# Patient Record
Sex: Female | Born: 1970
Health system: Southern US, Community
[De-identification: ages and names within clinical notes are randomized; demographics above are authoritative.]

## PROBLEM LIST (undated history)

## (undated) DIAGNOSIS — R0600 Dyspnea, unspecified: Secondary | ICD-10-CM

## (undated) DIAGNOSIS — E785 Hyperlipidemia, unspecified: Secondary | ICD-10-CM

## (undated) DIAGNOSIS — M1990 Unspecified osteoarthritis, unspecified site: Secondary | ICD-10-CM

## (undated) DIAGNOSIS — G4730 Sleep apnea, unspecified: Secondary | ICD-10-CM

## (undated) DIAGNOSIS — K219 Gastro-esophageal reflux disease without esophagitis: Secondary | ICD-10-CM

## (undated) DIAGNOSIS — E669 Obesity, unspecified: Secondary | ICD-10-CM

## (undated) DIAGNOSIS — D649 Anemia, unspecified: Secondary | ICD-10-CM

## (undated) DIAGNOSIS — R5383 Other fatigue: Secondary | ICD-10-CM

## (undated) DIAGNOSIS — G479 Sleep disorder, unspecified: Secondary | ICD-10-CM

## (undated) DIAGNOSIS — T7840XA Allergy, unspecified, initial encounter: Secondary | ICD-10-CM

## (undated) DIAGNOSIS — N6001 Solitary cyst of right breast: Secondary | ICD-10-CM

## (undated) DIAGNOSIS — R7303 Prediabetes: Secondary | ICD-10-CM

## (undated) DIAGNOSIS — Z720 Tobacco use: Secondary | ICD-10-CM

## (undated) DIAGNOSIS — M2550 Pain in unspecified joint: Secondary | ICD-10-CM

## (undated) DIAGNOSIS — K5900 Constipation, unspecified: Secondary | ICD-10-CM

## (undated) DIAGNOSIS — M549 Dorsalgia, unspecified: Secondary | ICD-10-CM

## (undated) DIAGNOSIS — R600 Localized edema: Secondary | ICD-10-CM

## (undated) DIAGNOSIS — I10 Essential (primary) hypertension: Secondary | ICD-10-CM

## (undated) HISTORY — DX: Dyspnea, unspecified: R06.00

## (undated) HISTORY — DX: Allergy, unspecified, initial encounter: T78.40XA

## (undated) HISTORY — DX: Dorsalgia, unspecified: M54.9

## (undated) HISTORY — DX: Pain in unspecified joint: M25.50

## (undated) HISTORY — PX: HAND FUSION: SHX975

## (undated) HISTORY — DX: Sleep disorder, unspecified: G47.9

## (undated) HISTORY — DX: Gastro-esophageal reflux disease without esophagitis: K21.9

## (undated) HISTORY — PX: ABDOMINAL HYSTERECTOMY: SHX81

## (undated) HISTORY — DX: Tobacco use: Z72.0

## (undated) HISTORY — DX: Localized edema: R60.0

## (undated) HISTORY — DX: Obesity, unspecified: E66.9

## (undated) HISTORY — DX: Other fatigue: R53.83

## (undated) HISTORY — DX: Hyperlipidemia, unspecified: E78.5

## (undated) HISTORY — DX: Constipation, unspecified: K59.00

---

## 2003-04-18 LAB — PULMONARY FUNCTION TEST

## 2011-05-10 HISTORY — PX: SLEEVE GASTROPLASTY: SHX1101

## 2013-03-26 ENCOUNTER — Other Ambulatory Visit: Payer: Self-pay | Admitting: Internal Medicine

## 2013-03-26 DIAGNOSIS — N920 Excessive and frequent menstruation with regular cycle: Secondary | ICD-10-CM

## 2013-04-22 ENCOUNTER — Ambulatory Visit
Admission: RE | Admit: 2013-04-22 | Discharge: 2013-04-22 | Disposition: A | Discharge status: Home / Self Care | Payer: BC Managed Care – PPO | Source: Ambulatory Visit | Attending: Internal Medicine | Admitting: Internal Medicine

## 2013-04-22 DIAGNOSIS — N920 Excessive and frequent menstruation with regular cycle: Secondary | ICD-10-CM

## 2013-06-04 ENCOUNTER — Other Ambulatory Visit: Payer: Self-pay

## 2013-06-04 DIAGNOSIS — Z1231 Encounter for screening mammogram for malignant neoplasm of breast: Secondary | ICD-10-CM

## 2013-06-06 ENCOUNTER — Ambulatory Visit: Payer: Self-pay | Admitting: Obstetrics & Gynecology

## 2013-06-10 ENCOUNTER — Encounter: Payer: Self-pay | Admitting: Obstetrics & Gynecology

## 2013-06-26 ENCOUNTER — Ambulatory Visit
Admission: RE | Admit: 2013-06-26 | Discharge: 2013-06-26 | Disposition: A | Discharge status: Home / Self Care | Payer: BC Managed Care – PPO | Source: Ambulatory Visit

## 2013-06-26 DIAGNOSIS — Z1231 Encounter for screening mammogram for malignant neoplasm of breast: Secondary | ICD-10-CM

## 2013-07-01 ENCOUNTER — Ambulatory Visit (INDEPENDENT_AMBULATORY_CARE_PROVIDER_SITE_OTHER): Payer: BC Managed Care – PPO | Admitting: Obstetrics & Gynecology

## 2013-07-01 ENCOUNTER — Encounter: Payer: Self-pay | Admitting: Obstetrics & Gynecology

## 2013-07-01 VITALS — BP 130/85 | HR 101 | Temp 98.5°F | Ht 67.0 in | Wt 198.0 lb

## 2013-07-01 DIAGNOSIS — N939 Abnormal uterine and vaginal bleeding, unspecified: Secondary | ICD-10-CM

## 2013-07-01 DIAGNOSIS — N390 Urinary tract infection, site not specified: Secondary | ICD-10-CM

## 2013-07-01 LAB — POCT URINALYSIS DIPSTICK
Bilirubin, UA: NEGATIVE
Blood, UA: NEGATIVE
Glucose, UA: NEGATIVE
Ketones, UA: NEGATIVE
NITRITE UA: POSITIVE
PH UA: 5
Spec Grav, UA: 1.025
UROBILINOGEN UA: NEGATIVE

## 2013-07-01 NOTE — Progress Notes (Unsigned)
Subjective:     Kayla [name] is a 43 y.o. female here for a routine exam.  Current complaints: Pt was referred by [facility].  Pt state that her menstral cycle are irregular with heavy bleeding.  Pt states that cycle are usually around 6 days. Pt states that she also has night sweats and hot flashes daily.  Pt states that this has been occurring for the past few months to a year.  Pt had an u/s at the end of 2014 with fibroids shown.   Personal health questionnaire reviewed: yes.   Gynecologic History Patient's last menstrual period was 06/12/2013. Contraception: none Last Pap: 1-2. Results were: normal Last mammogram: 2015. Results were: results not back  Obstetric History OB History  No data available     The following portions of the patient's history were reviewed and updated as appropriate: allergies, current medications, past family history, past medical history, past social history, past surgical history and problem list.  Review of Systems {ros; complete:30496}    Objective:    {exam; complete:18323}    Assessment:    Healthy female exam.    Plan:    {plan:19193}

## 2013-07-03 LAB — URINE CULTURE: Colony Count: >=100000

## 2013-07-05 ENCOUNTER — Encounter: Payer: Self-pay | Admitting: Obstetrics & Gynecology

## 2013-07-05 ENCOUNTER — Ambulatory Visit (INDEPENDENT_AMBULATORY_CARE_PROVIDER_SITE_OTHER): Payer: BC Managed Care – PPO | Admitting: Obstetrics & Gynecology

## 2013-07-05 VITALS — BP 133/93 | HR 90 | Temp 97.8°F | Ht 67.0 in | Wt 199.0 lb

## 2013-07-05 DIAGNOSIS — N926 Irregular menstruation, unspecified: Secondary | ICD-10-CM

## 2013-07-05 DIAGNOSIS — N939 Abnormal uterine and vaginal bleeding, unspecified: Secondary | ICD-10-CM

## 2013-07-05 LAB — CBC
HEMATOCRIT: 33.5 % — AB (ref 36.0–46.0)
Hemoglobin: 10.3 g/dL — ABNORMAL LOW (ref 12.0–15.0)
MCH: 25.6 pg — AB (ref 26.0–34.0)
MCHC: 30.7 g/dL (ref 30.0–36.0)
MCV: 83.1 fL (ref 78.0–100.0)
Platelets: 395 10*3/uL (ref 150–400)
RBC: 4.03 MIL/uL (ref 3.87–5.11)
RDW: 16.4 % — ABNORMAL HIGH (ref 11.5–15.5)
WBC: 11.4 10*3/uL — ABNORMAL HIGH (ref 4.0–10.5)

## 2013-07-05 LAB — POCT URINE PREGNANCY: Preg Test, Ur: NEGATIVE

## 2013-07-05 MED ORDER — NORETHINDRONE 0.35 MG PO TABS: 1.0000 | ORAL_TABLET | Freq: Every day | ORAL | Status: DC

## 2013-07-05 MED ORDER — SULFAMETHOXAZOLE-TMP DS 800-160 MG PO TABS: 1.0000 | ORAL_TABLET | Freq: Two times a day (BID) | ORAL | Status: DC

## 2013-07-05 NOTE — Progress Notes (Signed)
Subjective:     Kayla [name] is a 43 y.o. female here for a routine exam.  Current complaints: Patient is in the office today for a problem visit for her fibriods. Patient states she is having extreme heavy bleeding. Patient states she is passing blood clots. Patient states she is having extreme crampyiness. Patient states her cycles having been lasting for 7 days. Patient states that is not normal for her that the 4th day is normally the last day of her cycle. Patient states she has to wear two pads at at time and has to change quiet frequently. Patient states she can go through a 48 pack in 3 days but that is using 2 pads at a time.   Personal health questionnaire reviewed: yes.   Gynecologic History Patient's last menstrual period was 07/05/2013. Contraception: none Last Pap: 2013. Results were: normal Last mammogram: 2015. Results were: normal  Obstetric History OB History  Gravida Para Term Preterm AB SAB TAB Ectopic Multiple Living  4 4 4       3     # Outcome Date GA Lbr Len/2nd Weight Sex Delivery Anes PTL Lv  4 TRM 04/20/05 [redacted]w[redacted]d  8 lb (3.629 kg) M LTCS EPI  Y  3 TRM 01/26/03   5 lb 11 oz (2.58 kg) M SVD None  Y  2 TRM 06/23/91 [redacted]w[redacted]d  7 lb 11 oz (3.487 kg) F SVD None  Y  1 TRM 06/23/87 [redacted]w[redacted]d  7 lb 11 oz (3.487 kg) M SVD None  N       The following portions of the patient's history were reviewed and updated as appropriate: allergies, current medications, past family history, past medical history, past social history, past surgical history and problem list.  Review of Systems Pertinent items are noted in HPI.  Objective:   No exam performed today, patient refused exam due to heavy vaginal bleeding  Assessment:   AUB--A,L   Plan:   Hysterectomy- candidate for a laparoscopic approach UPT LABS-TSH , CBC,PROLACTIN Return for Pap smear, endometrial biopsy

## 2013-07-06 LAB — TSH: TSH: 1.621 u[IU]/mL (ref 0.350–4.500)

## 2013-07-06 LAB — PROLACTIN: Prolactin: 13 ng/mL

## 2013-07-10 NOTE — Patient Instructions (Signed)
Hysterectomy Information  A hysterectomy is a surgery in which your uterus is removed. This surgery may be done to treat various medical problems. After the surgery, you will no longer have menstrual periods. The surgery will also make you unable to become pregnant (sterile). The fallopian tubes and ovaries can be removed (bilateral salpingo-oophorectomy) during this surgery as well.  REASONS FOR A HYSTERECTOMY  Persistent, abnormal bleeding.  Lasting (chronic) pelvic pain or infection.  The lining of the uterus (endometrium) starts growing outside the uterus (endometriosis).  The endometrium starts growing in the muscle of the uterus (adenomyosis).  The uterus falls down into the vagina (pelvic organ prolapse).  Noncancerous growths in the uterus (uterine fibroids) that cause symptoms.  Precancerous cells.  Cervical cancer or uterine cancer. TYPES OF HYSTERECTOMIES  Supracervical hysterectomy In this type, the top part of the uterus is removed, but not the cervix.  Total hysterectomy The uterus and cervix are removed.  Radical hysterectomy The uterus, the cervix, and the fibrous tissue that holds the uterus in place in the pelvis (parametrium) are removed. WAYS A HYSTERECTOMY CAN BE PERFORMED  Abdominal hysterectomy A large surgical cut (incision) is made in the abdomen. The uterus is removed through this incision.  Vaginal hysterectomy An incision is made in the vagina. The uterus is removed through this incision. There are no abdominal incisions.  Conventional laparoscopic hysterectomy Three or four small incisions are made in the abdomen. A thin, lighted tube with a camera (laparoscope) is inserted into one of the incisions. Other tools are put through the other incisions. The uterus is cut into small pieces. The small pieces are removed through the incisions, or they are removed through the vagina.  Laparoscopically assisted vaginal hysterectomy (LAVH) Three or four small  incisions are made in the abdomen. Part of the surgery is performed laparoscopically and part vaginally. The uterus is removed through the vagina.  Robot-assisted laparoscopic hysterectomy A laparoscope and other tools are inserted into 3 or 4 small incisions in the abdomen. A computer-controlled device is used to give the surgeon a 3D image and to help control the surgical instruments. This allows for more precise movements of surgical instruments. The uterus is cut into small pieces and removed through the incisions or removed through the vagina. RISKS AND COMPLICATIONS  Possible complications associated with this procedure include:  Bleeding and risk of blood transfusion. Tell your health care provider if you do not want to receive any blood products.  Blood clots in the legs or lung.  Infection.  Injury to surrounding organs.  Problems or side effects related to anesthesia.  Conversion to an abdominal hysterectomy from one of the other techniques. WHAT TO EXPECT AFTER A HYSTERECTOMY  You will be given pain medicine.  You will need to have someone with you for the first 3 5 days after you go home.  You will need to follow up with your surgeon in 2 4 weeks after surgery to evaluate your progress.  You may have early menopause symptoms such as hot flashes, night sweats, and insomnia.  If you had a hysterectomy for a problem that was not cancer or not a condition that could lead to cancer, then you no longer need Pap tests. However, even if you no longer need a Pap test, a regular exam is a good idea to make sure no other problems are starting. Document Released: 10/19/2000 Document Revised: 02/13/2013 Document Reviewed: 12/31/2012 [facility] Patient Information 2014 [place].

## 2013-08-05 ENCOUNTER — Ambulatory Visit: Payer: BC Managed Care – PPO | Admitting: Obstetrics & Gynecology

## 2013-08-14 ENCOUNTER — Telehealth: Payer: Self-pay | Admitting: *Deleted

## 2013-08-14 NOTE — Telephone Encounter (Signed)
Error

## 2013-08-22 ENCOUNTER — Other Ambulatory Visit: Payer: Self-pay

## 2013-08-22 ENCOUNTER — Ambulatory Visit (INDEPENDENT_AMBULATORY_CARE_PROVIDER_SITE_OTHER): Payer: BC Managed Care – PPO | Admitting: Obstetrics & Gynecology

## 2013-08-22 VITALS — BP 128/85 | HR 90 | Temp 98.3°F | Wt 199.0 lb

## 2013-08-22 DIAGNOSIS — N938 Other specified abnormal uterine and vaginal bleeding: Secondary | ICD-10-CM

## 2013-08-22 DIAGNOSIS — Z124 Encounter for screening for malignant neoplasm of cervix: Secondary | ICD-10-CM

## 2013-08-22 DIAGNOSIS — Z01818 Encounter for other preprocedural examination: Secondary | ICD-10-CM

## 2013-08-22 DIAGNOSIS — N925 Other specified irregular menstruation: Secondary | ICD-10-CM

## 2013-08-22 DIAGNOSIS — N949 Unspecified condition associated with female genital organs and menstrual cycle: Secondary | ICD-10-CM

## 2013-08-22 LAB — POCT URINE PREGNANCY: Preg Test, Ur: NEGATIVE

## 2013-08-22 NOTE — Progress Notes (Signed)
Endometrial Biopsy Procedure Note  Pre-operative Diagnosis: AUB--L  Post-operative Diagnosis: same  Indications: abnormal uterine bleeding  Procedure Details   Urine pregnancy test was done and result was negative.  The risks (including infection, bleeding, pain, and uterine perforation) and benefits of the procedure were explained to the patient and Written informed consent was obtained.   The patient was placed in the dorsal lithotomy position.  Bimanual exam showed the uterus to be in the anteroflexed position.  A Graves' speculum inserted in the vagina, and the cervix prepped with povidone iodine.  Endocervical curettage with a Kevorkian curette was not performed.   A sharp tenaculum was applied to the anterior lip of the cervix for stabilization.  A sterile uterine sound was used to sound the uterus to a depth of 9cm.  A Pipelle endometrial aspirator was used to sample the endometrium.  Sample was sent for pathologic examination.  Condition: Stable  Complications: None  Plan:  The patient was advised to call for any fever or for prolonged or severe pain or bleeding. She was advised to use OTC analgesics as needed for mild to moderate pain. She was advised to avoid vaginal intercourse for 48 hours or until the bleeding has completely stopped.

## 2013-08-23 LAB — PAP IG AND HPV HIGH-RISK: HPV DNA High Risk: NOT DETECTED

## 2013-08-25 ENCOUNTER — Encounter: Payer: Self-pay | Admitting: Obstetrics & Gynecology

## 2013-08-25 NOTE — Patient Instructions (Signed)
Endometrial Biopsy Endometrial biopsy is a procedure in which a tissue sample is taken from inside the uterus. The tissue sample is then looked at under a microscope to see if the tissue is normal or abnormal. The endometrium is the lining of the uterus. This procedure helps determine where you are in your menstrual cycle and how hormone levels are affecting the lining of the uterus. This procedure may also be used to evaluate uterine bleeding or to diagnose endometrial cancer, tuberculosis, polyps, or inflammatory conditions.  LET YOUR HEALTH CARE PROVIDER KNOW ABOUT:  Any allergies you have.  All medicines you are taking, including vitamins, herbs, eye drops, creams, and over-the-counter medicines.  Previous problems you or members of your family have had with the use of anesthetics.  Any blood disorders you have.  Previous surgeries you have had.  Medical conditions you have.  Possibility of pregnancy. RISKS AND COMPLICATIONS Generally, this is a safe procedure. However, as with any procedure, complications can occur. Possible complications include:  Bleeding.  Pelvic infection.  Puncture of the uterine wall with the biopsy device (rare). BEFORE THE PROCEDURE   Keep a record of your menstrual cycles as directed by your health care provider. You may need to schedule your procedure for a specific time in your cycle.  You may want to bring a sanitary pad to wear home after the procedure.  Arrange for someone to drive you home after the procedure if you will be given a medicine to help you relax (sedative). PROCEDURE   You may be given a sedative to relax you.  You will lie on an exam table with your feet and legs supported as in a pelvic exam.  Your health care provider will insert an instrument (speculum) into your vagina to see your cervix.  Your cervix will be cleansed with an antiseptic solution. A medicine (local anesthetic) will be used to numb the cervix.  A forceps  instrument (tenaculum) will be used to hold your cervix steady for the biopsy.  A thin, rodlike instrument (uterine sound) will be inserted through your cervix to determine the length of your uterus and the location where the biopsy sample will be removed.  A thin, flexible tube (catheter) will be inserted through your cervix and into the uterus. The catheter is used to collect the biopsy sample from your endometrial tissue.  The catheter and speculum will then be removed, and the tissue sample will be sent to a lab for examination. AFTER THE PROCEDURE  You will rest in a recovery area until you are ready to go home.  You may have mild cramping and a small amount of vaginal bleeding for a few days after the procedure. This is normal.  Make sure you find out how to get your test results. Document Released: 08/26/2004 Document Revised: 12/26/2012 Document Reviewed: 10/10/2012 [employer] Patient Information 2014 [employer].  

## 2013-08-26 ENCOUNTER — Other Ambulatory Visit: Payer: Self-pay | Admitting: *Deleted

## 2013-08-26 ENCOUNTER — Ambulatory Visit: Payer: BC Managed Care – PPO | Admitting: *Deleted

## 2013-08-26 ENCOUNTER — Encounter: Payer: Self-pay | Admitting: Obstetrics & Gynecology

## 2013-08-26 ENCOUNTER — Ambulatory Visit (INDEPENDENT_AMBULATORY_CARE_PROVIDER_SITE_OTHER): Payer: BC Managed Care – PPO | Admitting: *Deleted

## 2013-08-26 VITALS — BP 124/82 | HR 98 | Temp 98.1°F | Ht 67.5 in | Wt 204.0 lb

## 2013-08-26 VITALS — BP 124/82 | HR 98 | Temp 98.1°F | Wt 204.0 lb

## 2013-08-26 DIAGNOSIS — N898 Other specified noninflammatory disorders of vagina: Secondary | ICD-10-CM

## 2013-08-26 MED ORDER — FLUCONAZOLE 150 MG PO TABS: 150.0000 mg | ORAL_TABLET | ORAL | Status: DC

## 2013-08-26 NOTE — Progress Notes (Unsigned)
Patient is in the office today for a Problem Visit. Patient originally planned to see doctor Delsa Sale but could not wait because of work. Patient states last Thursday she had a biopsy done in the office and ever since then has been having irritation, itching, burning, and a watery vaginal discharge. Patient denies any vaginal odor. Patient obtained her on Affirm Specimen. Specimen to be sent to the lab. Per Nursing Protocol Diflucan sent to the Pharmacy.

## 2013-08-26 NOTE — Progress Notes (Unsigned)
Patient states last Thursday she had a biopsy and even since then she has been having itching, irritation, burning and a watery vaginal discharge. Patient denies any vaginal odor. Patient was prepared to see the doctor but could not wait due to work. Patient obtained her own affirm specimen. Affirm sent to Lab. Per Nursing Protocol Diflucan sent to pharmacy.

## 2013-08-27 LAB — WET PREP BY MOLECULAR PROBE
Candida species: POSITIVE — AB
GARDNERELLA VAGINALIS: NEGATIVE
Trichomonas vaginosis: NEGATIVE

## 2013-08-28 ENCOUNTER — Encounter: Payer: Self-pay | Admitting: Obstetrics & Gynecology

## 2013-08-30 ENCOUNTER — Encounter: Payer: Self-pay | Admitting: Obstetrics & Gynecology

## 2013-09-09 ENCOUNTER — Encounter: Payer: Self-pay | Admitting: *Deleted

## 2013-09-13 ENCOUNTER — Encounter (HOSPITAL_COMMUNITY): Payer: Self-pay | Admitting: Pharmacist

## 2013-09-24 NOTE — H&P (Signed)
  Subjective:  Kayla [name] is a 43 y.o.  female.  I was consulted regarding irregular bleeding.  Onset of symptoms was gradual starting 1 year ago with unchanged course since that time. Bleeding is characterized as heavy.  Pertinent Gyn History:  Menses see above Bleeding: see above Blood transfusions: none  STDs: no past history Preventive screening:   Last pap: normal Date: 4/15   No past medical history on file.  Past Surgical History  Procedure Laterality Date  . Cesarean section    . Sleeve gastroplasty      No prescriptions prior to admission   Allergies  Allergen Reactions  . Morphine And Related Nausea And Vomiting    History  Substance Use Topics  . Smoking status: Current Every Day Smoker  . Smokeless tobacco: Never Used  . Alcohol Use: Yes     Comment: occasionally     Family History  Problem Relation Age of Onset  . Hypertension Mother      Review of Systems Pertinent items are noted in HPI.    Objective:   Vital signs in last 24 hours:       Assessment/Plan: Small, fibroid uterus--secondary AUB  A robotic-assisted hysterectomy with opportunistic salpingectomy   I had a lengthy discussion with the patient regarding her bleeding and consideration for endometrial ablation versus hysterectomy.  Procedure, risks, reasons, benefits and complications (including injury to bowel, bladder, major blood vessel, ureter, bleeding, possibility of transfusion, infection, thromboembolism or fistula formation) were reviewed in detail. Consent was signed and preop testing was ordered.  Instructions were reviewed, including NPO after midnight.

## 2013-09-25 ENCOUNTER — Encounter (HOSPITAL_COMMUNITY)
Admission: RE | Admit: 2013-09-25 | Discharge: 2013-09-25 | Disposition: A | Discharge status: Home / Self Care | Payer: BC Managed Care – PPO | Source: Ambulatory Visit | Attending: Obstetrics & Gynecology | Admitting: Obstetrics & Gynecology

## 2013-09-25 ENCOUNTER — Encounter (HOSPITAL_COMMUNITY): Payer: Self-pay

## 2013-09-25 HISTORY — DX: Anemia, unspecified: D64.9

## 2013-09-25 HISTORY — DX: Sleep apnea, unspecified: G47.30

## 2013-09-25 LAB — CBC
HCT: 36.5 % (ref 36.0–46.0)
HEMOGLOBIN: 11.9 g/dL — AB (ref 12.0–15.0)
MCH: 27.9 pg (ref 26.0–34.0)
MCHC: 32.6 g/dL (ref 30.0–36.0)
MCV: 85.7 fL (ref 78.0–100.0)
Platelets: 255 10*3/uL (ref 150–400)
RBC: 4.26 MIL/uL (ref 3.87–5.11)
RDW: 18.8 % — ABNORMAL HIGH (ref 11.5–15.5)
WBC: 9.2 10*3/uL (ref 4.0–10.5)

## 2013-09-25 LAB — ABO/RH: ABO/RH(D): O POS

## 2013-09-25 NOTE — Patient Instructions (Addendum)
   Your procedure is scheduled on:  Friday, May 22  Enter through the Main Entrance of [facility] at: 8 AM Pick up the phone at the desk and dial [phone] and inform us of your arrival.  Please call this number if you have any problems the morning of surgery: [phone]  Remember: Do not eat or drink after midnight: Thursday Take these medicines the morning of surgery with a SIP OF WATER: None  Do not wear jewelry, make-up, or FINGER nail polish No metal in your hair or on your body. Do not wear lotions, powders, perfumes.  You may wear deodorant.  Do not bring valuables to the hospital. Contacts, dentures or bridgework may not be worn into surgery.  Leave suitcase in the car. After Surgery it may be brought to your room. For patients being admitted to the hospital, checkout time is 11:00am the day of discharge.  Home with sister-in-law Hilda Blades cell [phone]

## 2013-09-26 MED ORDER — METRONIDAZOLE IN NACL 5-0.79 MG/ML-% IV SOLN
500.0000 mg | Freq: Once | INTRAVENOUS | Status: AC
  Administered 2013-09-27: 500 mg via INTRAVENOUS
  Filled 2013-09-26: qty 100

## 2013-09-27 ENCOUNTER — Encounter (HOSPITAL_COMMUNITY): Payer: Self-pay | Admitting: *Deleted

## 2013-09-27 ENCOUNTER — Ambulatory Visit (HOSPITAL_COMMUNITY): Payer: BC Managed Care – PPO | Admitting: Anesthesiology

## 2013-09-27 ENCOUNTER — Encounter (HOSPITAL_COMMUNITY)
Admission: RE | Disposition: A | Discharge status: Home / Self Care | Payer: Self-pay | Source: Ambulatory Visit | Attending: Obstetrics & Gynecology

## 2013-09-27 ENCOUNTER — Encounter (HOSPITAL_COMMUNITY): Payer: BC Managed Care – PPO | Admitting: Anesthesiology

## 2013-09-27 ENCOUNTER — Inpatient Hospital Stay (HOSPITAL_COMMUNITY)
Admission: RE | Admit: 2013-09-27 | Discharge: 2013-09-29 | DRG: 742 | Disposition: A | Discharge status: Home / Self Care | Payer: BC Managed Care – PPO | Source: Ambulatory Visit | Attending: Obstetrics & Gynecology | Admitting: Obstetrics & Gynecology

## 2013-09-27 DIAGNOSIS — N840 Polyp of corpus uteri: Secondary | ICD-10-CM | POA: Diagnosis present

## 2013-09-27 DIAGNOSIS — Z9884 Bariatric surgery status: Secondary | ICD-10-CM

## 2013-09-27 DIAGNOSIS — D251 Intramural leiomyoma of uterus: Secondary | ICD-10-CM | POA: Diagnosis present

## 2013-09-27 DIAGNOSIS — N925 Other specified irregular menstruation: Principal | ICD-10-CM | POA: Diagnosis present

## 2013-09-27 DIAGNOSIS — D259 Leiomyoma of uterus, unspecified: Secondary | ICD-10-CM

## 2013-09-27 DIAGNOSIS — Y658 Other specified misadventures during surgical and medical care: Secondary | ICD-10-CM | POA: Diagnosis not present

## 2013-09-27 DIAGNOSIS — IMO0002 Reserved for concepts with insufficient information to code with codable children: Secondary | ICD-10-CM | POA: Diagnosis not present

## 2013-09-27 DIAGNOSIS — K661 Hemoperitoneum: Secondary | ICD-10-CM

## 2013-09-27 DIAGNOSIS — S3590XA Unspecified injury of unspecified blood vessel at abdomen, lower back and pelvis level, initial encounter: Secondary | ICD-10-CM

## 2013-09-27 DIAGNOSIS — N938 Other specified abnormal uterine and vaginal bleeding: Principal | ICD-10-CM | POA: Diagnosis present

## 2013-09-27 DIAGNOSIS — N949 Unspecified condition associated with female genital organs and menstrual cycle: Principal | ICD-10-CM | POA: Diagnosis present

## 2013-09-27 DIAGNOSIS — D62 Acute posthemorrhagic anemia: Secondary | ICD-10-CM | POA: Diagnosis not present

## 2013-09-27 DIAGNOSIS — D252 Subserosal leiomyoma of uterus: Secondary | ICD-10-CM | POA: Diagnosis present

## 2013-09-27 DIAGNOSIS — Y9210 Unspecified residential institution as the place of occurrence of the external cause: Secondary | ICD-10-CM | POA: Diagnosis not present

## 2013-09-27 DIAGNOSIS — G4733 Obstructive sleep apnea (adult) (pediatric): Secondary | ICD-10-CM | POA: Diagnosis present

## 2013-09-27 DIAGNOSIS — N838 Other noninflammatory disorders of ovary, fallopian tube and broad ligament: Secondary | ICD-10-CM | POA: Diagnosis present

## 2013-09-27 DIAGNOSIS — F17200 Nicotine dependence, unspecified, uncomplicated: Secondary | ICD-10-CM | POA: Diagnosis present

## 2013-09-27 HISTORY — PX: LAPAROTOMY: SHX154

## 2013-09-27 HISTORY — PX: BILATERAL SALPINGECTOMY: SHX5743

## 2013-09-27 HISTORY — PX: ROBOTIC ASSISTED TOTAL HYSTERECTOMY: SHX6085

## 2013-09-27 LAB — CBC
HCT: 31.5 % — ABNORMAL LOW (ref 36.0–46.0)
HEMOGLOBIN: 10.3 g/dL — AB (ref 12.0–15.0)
MCH: 28 pg (ref 26.0–34.0)
MCHC: 32.7 g/dL (ref 30.0–36.0)
MCV: 85.6 fL (ref 78.0–100.0)
Platelets: 223 10*3/uL (ref 150–400)
RBC: 3.68 MIL/uL — AB (ref 3.87–5.11)
RDW: 18.8 % — ABNORMAL HIGH (ref 11.5–15.5)
WBC: 15.8 10*3/uL — AB (ref 4.0–10.5)

## 2013-09-27 LAB — PREGNANCY, URINE: Preg Test, Ur: NEGATIVE

## 2013-09-27 LAB — PREPARE RBC (CROSSMATCH)

## 2013-09-27 SURGERY — ROBOTIC ASSISTED TOTAL HYSTERECTOMY
Anesthesia: General | Site: Abdomen

## 2013-09-27 MED ORDER — LIDOCAINE HCL (CARDIAC) 20 MG/ML IV SOLN
INTRAVENOUS | Status: AC
  Filled 2013-09-27: qty 5

## 2013-09-27 MED ORDER — HYDROMORPHONE HCL PF 1 MG/ML IJ SOLN
INTRAMUSCULAR | Status: DC | PRN
  Administered 2013-09-27 (×2): 0.5 mg via INTRAVENOUS

## 2013-09-27 MED ORDER — CEFAZOLIN SODIUM-DEXTROSE 2-3 GM-% IV SOLR
2.0000 g | INTRAVENOUS | Status: AC
  Administered 2013-09-27: 2 g via INTRAVENOUS

## 2013-09-27 MED ORDER — ONDANSETRON HCL 4 MG/2ML IJ SOLN: 4.0000 mg | Freq: Four times a day (QID) | INTRAMUSCULAR | Status: DC | PRN

## 2013-09-27 MED ORDER — HYDROMORPHONE HCL PF 1 MG/ML IJ SOLN
0.5000 mg | INTRAMUSCULAR | Status: AC | PRN
  Administered 2013-09-27 (×2): 0.5 mg via INTRAVENOUS
  Administered 2013-09-27 (×2): 0.25 mg via INTRAVENOUS

## 2013-09-27 MED ORDER — SODIUM CHLORIDE 0.9 % IV SOLN
INTRAVENOUS | Status: DC | PRN
  Administered 2013-09-27: 12:00:00 via INTRAVENOUS

## 2013-09-27 MED ORDER — MEPERIDINE HCL 25 MG/ML IJ SOLN: 6.2500 mg | INTRAMUSCULAR | Status: DC | PRN

## 2013-09-27 MED ORDER — FENTANYL CITRATE 0.05 MG/ML IJ SOLN
INTRAMUSCULAR | Status: AC
  Administered 2013-09-27: 50 ug via INTRAVENOUS
  Filled 2013-09-27: qty 2

## 2013-09-27 MED ORDER — HYDROMORPHONE HCL PF 1 MG/ML IJ SOLN
INTRAMUSCULAR | Status: AC
  Filled 2013-09-27: qty 1

## 2013-09-27 MED ORDER — LACTATED RINGERS IV SOLN
INTRAVENOUS | Status: DC
  Administered 2013-09-27 (×3): via INTRAVENOUS

## 2013-09-27 MED ORDER — PROMETHAZINE HCL 25 MG/ML IJ SOLN: 6.2500 mg | INTRAMUSCULAR | Status: DC | PRN

## 2013-09-27 MED ORDER — LACTATED RINGERS IV SOLN: INTRAVENOUS | Status: DC | PRN

## 2013-09-27 MED ORDER — KETOROLAC TROMETHAMINE 30 MG/ML IJ SOLN: 30.0000 mg | Freq: Four times a day (QID) | INTRAMUSCULAR | Status: DC

## 2013-09-27 MED ORDER — SODIUM CHLORIDE 0.9 % IJ SOLN
INTRAMUSCULAR | Status: DC | PRN
  Administered 2013-09-27: 20 mL

## 2013-09-27 MED ORDER — SIMETHICONE 80 MG PO CHEW: 80.0000 mg | CHEWABLE_TABLET | Freq: Four times a day (QID) | ORAL | Status: DC | PRN

## 2013-09-27 MED ORDER — NEOSTIGMINE METHYLSULFATE 10 MG/10ML IV SOLN
INTRAVENOUS | Status: DC | PRN
  Administered 2013-09-27: 3 mg via INTRAVENOUS

## 2013-09-27 MED ORDER — CEFAZOLIN SODIUM-DEXTROSE 2-3 GM-% IV SOLR
2.0000 g | Freq: Once | INTRAVENOUS | Status: AC
  Administered 2013-09-27: 2 g via INTRAVENOUS

## 2013-09-27 MED ORDER — MAGNESIUM HYDROXIDE 400 MG/5ML PO SUSP
30.0000 mL | Freq: Two times a day (BID) | ORAL | Status: AC
  Administered 2013-09-27 – 2013-09-28 (×3): 30 mL via ORAL
  Filled 2013-09-27 (×3): qty 30

## 2013-09-27 MED ORDER — PHENYLEPHRINE HCL 10 MG/ML IJ SOLN
INTRAMUSCULAR | Status: DC | PRN
  Administered 2013-09-27: 40 ug via INTRAVENOUS
  Administered 2013-09-27 (×4): 80 ug via INTRAVENOUS
  Administered 2013-09-27: 40 ug via INTRAVENOUS

## 2013-09-27 MED ORDER — PROPOFOL 10 MG/ML IV EMUL
INTRAVENOUS | Status: AC
  Filled 2013-09-27: qty 20

## 2013-09-27 MED ORDER — PANTOPRAZOLE SODIUM 40 MG PO TBEC
40.0000 mg | DELAYED_RELEASE_TABLET | Freq: Every day | ORAL | Status: DC
  Administered 2013-09-28: 40 mg via ORAL
  Filled 2013-09-27 (×3): qty 1

## 2013-09-27 MED ORDER — OXYCODONE-ACETAMINOPHEN 5-325 MG PO TABS: 1.0000 | ORAL_TABLET | ORAL | Status: DC | PRN

## 2013-09-27 MED ORDER — GLYCOPYRROLATE 0.2 MG/ML IJ SOLN
INTRAMUSCULAR | Status: DC | PRN
  Administered 2013-09-27: 0.4 mg via INTRAVENOUS

## 2013-09-27 MED ORDER — SODIUM CHLORIDE 0.9 % IJ SOLN
INTRAMUSCULAR | Status: AC
  Filled 2013-09-27: qty 50

## 2013-09-27 MED ORDER — MIDAZOLAM HCL 2 MG/2ML IJ SOLN
INTRAMUSCULAR | Status: DC | PRN
  Administered 2013-09-27: 2 mg via INTRAVENOUS

## 2013-09-27 MED ORDER — FENTANYL CITRATE 0.05 MG/ML IJ SOLN
INTRAMUSCULAR | Status: AC
  Filled 2013-09-27: qty 5

## 2013-09-27 MED ORDER — NEOSTIGMINE METHYLSULFATE 10 MG/10ML IV SOLN
INTRAVENOUS | Status: AC
  Filled 2013-09-27: qty 1

## 2013-09-27 MED ORDER — ACETAMINOPHEN 160 MG/5ML PO SOLN
975.0000 mg | Freq: Four times a day (QID) | ORAL | Status: DC | PRN
  Filled 2013-09-27: qty 40

## 2013-09-27 MED ORDER — PNEUMOCOCCAL VAC POLYVALENT 25 MCG/0.5ML IJ INJ
0.5000 mL | INJECTION | INTRAMUSCULAR | Status: DC
  Filled 2013-09-27: qty 0.5

## 2013-09-27 MED ORDER — BUPIVACAINE HCL (PF) 0.25 % IJ SOLN
INTRAMUSCULAR | Status: AC
  Filled 2013-09-27: qty 30

## 2013-09-27 MED ORDER — BUPIVACAINE HCL (PF) 0.25 % IJ SOLN
INTRAMUSCULAR | Status: DC | PRN
  Administered 2013-09-27: 10 mL

## 2013-09-27 MED ORDER — HYDROMORPHONE HCL PF 1 MG/ML IJ SOLN
0.2000 mg | INTRAMUSCULAR | Status: DC | PRN
  Administered 2013-09-27 – 2013-09-28 (×6): 0.6 mg via INTRAVENOUS
  Filled 2013-09-27 (×6): qty 1

## 2013-09-27 MED ORDER — HYDROMORPHONE HCL PF 1 MG/ML IJ SOLN
INTRAMUSCULAR | Status: AC
  Administered 2013-09-27: 0.25 mg via INTRAVENOUS
  Filled 2013-09-27: qty 1

## 2013-09-27 MED ORDER — KETOROLAC TROMETHAMINE 30 MG/ML IJ SOLN: 15.0000 mg | Freq: Once | INTRAMUSCULAR | Status: DC | PRN

## 2013-09-27 MED ORDER — CEFAZOLIN SODIUM-DEXTROSE 2-3 GM-% IV SOLR
INTRAVENOUS | Status: AC
  Filled 2013-09-27: qty 50

## 2013-09-27 MED ORDER — FENTANYL CITRATE 0.05 MG/ML IJ SOLN
25.0000 ug | INTRAMUSCULAR | Status: DC | PRN
  Administered 2013-09-27 (×4): 50 ug via INTRAVENOUS

## 2013-09-27 MED ORDER — DIPHENHYDRAMINE HCL 50 MG/ML IJ SOLN
INTRAMUSCULAR | Status: AC
  Filled 2013-09-27: qty 1

## 2013-09-27 MED ORDER — BUPIVACAINE LIPOSOME 1.3 % IJ SUSP
20.0000 mL | Freq: Once | INTRAMUSCULAR | Status: AC
  Administered 2013-09-27: 20 mL
  Filled 2013-09-27: qty 20

## 2013-09-27 MED ORDER — HYDROMORPHONE HCL PF 1 MG/ML IJ SOLN
INTRAMUSCULAR | Status: AC
  Administered 2013-09-27: 0.5 mg
  Filled 2013-09-27: qty 1

## 2013-09-27 MED ORDER — GLYCOPYRROLATE 0.2 MG/ML IJ SOLN
INTRAMUSCULAR | Status: AC
  Filled 2013-09-27: qty 2

## 2013-09-27 MED ORDER — POTASSIUM CHLORIDE IN NACL 20-0.45 MEQ/L-% IV SOLN
INTRAVENOUS | Status: DC
  Administered 2013-09-27 – 2013-09-28 (×2): via INTRAVENOUS
  Filled 2013-09-27 (×4): qty 1000

## 2013-09-27 MED ORDER — DIPHENHYDRAMINE HCL 50 MG/ML IJ SOLN
12.5000 mg | Freq: Once | INTRAMUSCULAR | Status: AC
  Administered 2013-09-27: 12.5 mg via INTRAVENOUS

## 2013-09-27 MED ORDER — ARTIFICIAL TEARS OP OINT
TOPICAL_OINTMENT | OPHTHALMIC | Status: AC
  Filled 2013-09-27: qty 3.5

## 2013-09-27 MED ORDER — PROPOFOL 10 MG/ML IV BOLUS
INTRAVENOUS | Status: DC | PRN
  Administered 2013-09-27: 200 mg via INTRAVENOUS
  Administered 2013-09-27: 50 mg via INTRAVENOUS

## 2013-09-27 MED ORDER — LACTATED RINGERS IR SOLN
Status: DC | PRN
  Administered 2013-09-27: 3000 mL

## 2013-09-27 MED ORDER — KETOROLAC TROMETHAMINE 30 MG/ML IJ SOLN
30.0000 mg | Freq: Four times a day (QID) | INTRAMUSCULAR | Status: DC
  Administered 2013-09-27 – 2013-09-28 (×2): 30 mg via INTRAVENOUS
  Filled 2013-09-27 (×2): qty 1

## 2013-09-27 MED ORDER — MENTHOL 3 MG MT LOZG: 1.0000 | LOZENGE | OROMUCOSAL | Status: DC | PRN

## 2013-09-27 MED ORDER — FENTANYL CITRATE 0.05 MG/ML IJ SOLN
INTRAMUSCULAR | Status: AC
  Filled 2013-09-27: qty 2

## 2013-09-27 MED ORDER — PHENYLEPHRINE HCL 10 MG/ML IJ SOLN
10.0000 mg | INTRAVENOUS | Status: DC | PRN
  Administered 2013-09-27: 10 ug/min via INTRAVENOUS

## 2013-09-27 MED ORDER — ACETAMINOPHEN 10 MG/ML IV SOLN
1000.0000 mg | Freq: Once | INTRAVENOUS | Status: AC
  Administered 2013-09-27: 1000 mg via INTRAVENOUS
  Filled 2013-09-27: qty 100

## 2013-09-27 MED ORDER — FENTANYL CITRATE 0.05 MG/ML IJ SOLN
INTRAMUSCULAR | Status: DC | PRN
  Administered 2013-09-27: 50 ug via INTRAVENOUS
  Administered 2013-09-27 (×3): 100 ug via INTRAVENOUS

## 2013-09-27 MED ORDER — MIDAZOLAM HCL 2 MG/2ML IJ SOLN: 0.5000 mg | Freq: Once | INTRAMUSCULAR | Status: DC | PRN

## 2013-09-27 MED ORDER — MIDAZOLAM HCL 2 MG/2ML IJ SOLN
INTRAMUSCULAR | Status: AC
  Filled 2013-09-27: qty 2

## 2013-09-27 MED ORDER — ONDANSETRON HCL 4 MG PO TABS: 4.0000 mg | ORAL_TABLET | Freq: Four times a day (QID) | ORAL | Status: DC | PRN

## 2013-09-27 MED ORDER — LIDOCAINE HCL (CARDIAC) 20 MG/ML IV SOLN
INTRAVENOUS | Status: DC | PRN
  Administered 2013-09-27: 50 mg via INTRAVENOUS

## 2013-09-27 MED ORDER — ONDANSETRON HCL 4 MG/2ML IJ SOLN
INTRAMUSCULAR | Status: AC
  Filled 2013-09-27: qty 2

## 2013-09-27 MED ORDER — KETOROLAC TROMETHAMINE 30 MG/ML IJ SOLN: 30.0000 mg | Freq: Once | INTRAMUSCULAR | Status: DC

## 2013-09-27 MED ORDER — ONDANSETRON HCL 4 MG/2ML IJ SOLN
INTRAMUSCULAR | Status: DC | PRN
Start: 2013-09-27 — End: 2013-09-27
  Administered 2013-09-27: 4 mg via INTRAVENOUS

## 2013-09-27 MED ORDER — ACETAMINOPHEN 500 MG PO TABS
1000.0000 mg | ORAL_TABLET | Freq: Four times a day (QID) | ORAL | Status: DC
  Administered 2013-09-28 – 2013-09-29 (×6): 1000 mg via ORAL
  Filled 2013-09-27 (×6): qty 2

## 2013-09-27 MED ORDER — ROCURONIUM BROMIDE 100 MG/10ML IV SOLN
INTRAVENOUS | Status: AC
  Filled 2013-09-27: qty 1

## 2013-09-27 MED ORDER — PHENYLEPHRINE 40 MCG/ML (10ML) SYRINGE FOR IV PUSH (FOR BLOOD PRESSURE SUPPORT)
PREFILLED_SYRINGE | INTRAVENOUS | Status: AC
  Filled 2013-09-27: qty 10

## 2013-09-27 MED ORDER — HYDROMORPHONE HCL PF 1 MG/ML IJ SOLN
0.2500 mg | INTRAMUSCULAR | Status: DC | PRN
  Administered 2013-09-27: 0.5 mg via INTRAVENOUS

## 2013-09-27 MED ORDER — ROCURONIUM BROMIDE 100 MG/10ML IV SOLN
INTRAVENOUS | Status: DC | PRN
  Administered 2013-09-27 (×2): 10 mg via INTRAVENOUS
  Administered 2013-09-27 (×2): 20 mg via INTRAVENOUS
  Administered 2013-09-27: 50 mg via INTRAVENOUS

## 2013-09-27 SURGICAL SUPPLY — 69 items
APPLICATOR COTTON TIP 6IN STRL (MISCELLANEOUS) ×15 IMPLANT
BLADE SURG 10 STRL SS (BLADE) ×5 IMPLANT
CABLE HIGH FREQUENCY MONO STRZ (ELECTRODE) ×5 IMPLANT
CATH FOLEY 3WAY  5CC 16FR (CATHETERS) ×1
CATH FOLEY 3WAY 5CC 16FR (CATHETERS) ×4 IMPLANT
CHLORAPREP W/TINT 26ML (MISCELLANEOUS) ×5 IMPLANT
CLIP TI MEDIUM 6 (CLIP) ×15 IMPLANT
CLOTH BEACON ORANGE TIMEOUT ST (SAFETY) ×5 IMPLANT
CONT PATH 16OZ SNAP LID 3702 (MISCELLANEOUS) ×5 IMPLANT
CORD BIPOLAR FORCEPS 12FT (ELECTRODE) ×5 IMPLANT
COVER MAYO STAND STRL (DRAPES) ×5 IMPLANT
COVER TABLE BACK 60X90 (DRAPES) ×10 IMPLANT
COVER TIP SHEARS 8 DVNC (MISCELLANEOUS) ×4 IMPLANT
COVER TIP SHEARS 8MM DA VINCI (MISCELLANEOUS) ×1
DECANTER SPIKE VIAL GLASS SM (MISCELLANEOUS) ×15 IMPLANT
DERMABOND ADVANCED (GAUZE/BANDAGES/DRESSINGS) ×1
DERMABOND ADVANCED .7 DNX12 (GAUZE/BANDAGES/DRESSINGS) ×4 IMPLANT
DRAPE HUG U DISPOSABLE (DRAPE) ×5 IMPLANT
DRAPE LG THREE QUARTER DISP (DRAPES) ×10 IMPLANT
DRAPE WARM FLUID 44X44 (DRAPE) ×5 IMPLANT
DRSG OPSITE POSTOP 4X10 (GAUZE/BANDAGES/DRESSINGS) ×5 IMPLANT
ELECT REM PT RETURN 9FT ADLT (ELECTROSURGICAL) ×5
ELECTRODE REM PT RTRN 9FT ADLT (ELECTROSURGICAL) ×4 IMPLANT
EVACUATOR SMOKE 8.L (FILTER) ×10 IMPLANT
GLOVE BIO SURGEON STRL SZ 6.5 (GLOVE) ×10 IMPLANT
GLOVE BIO SURGEON STRL SZ8 (GLOVE) ×45 IMPLANT
GLOVE BIOGEL PI IND STRL 7.0 (GLOVE) ×40 IMPLANT
GLOVE BIOGEL PI INDICATOR 7.0 (GLOVE) ×10
GLOVE ECLIPSE 6.5 STRL STRAW (GLOVE) ×20 IMPLANT
GLOVE SURG SS PI 7.0 STRL IVOR (GLOVE) ×70 IMPLANT
GOWN STRL REUS W/TWL LRG LVL3 (GOWN DISPOSABLE) ×40 IMPLANT
GOWN STRL REUS W/TWL XL LVL3 (GOWN DISPOSABLE) ×20 IMPLANT
LEGGING LITHOTOMY PAIR STRL (DRAPES) ×5 IMPLANT
MANIPULATOR UTERINE 4.5 ZUMI (MISCELLANEOUS) ×5 IMPLANT
NEEDLE HYPO 22GX1.5 SAFETY (NEEDLE) ×5 IMPLANT
NEEDLE INSUFFLATION 120MM (ENDOMECHANICALS) ×5 IMPLANT
NS IRRIG 1000ML POUR BTL (IV SOLUTION) ×5 IMPLANT
OCCLUDER COLPOPNEUMO (BALLOONS) ×5 IMPLANT
PACK LAPAROSCOPY BASIN (CUSTOM PROCEDURE TRAY) ×5 IMPLANT
PACK LAVH (CUSTOM PROCEDURE TRAY) ×5 IMPLANT
PAD PREP 24X48 CUFFED NSTRL (MISCELLANEOUS) ×15 IMPLANT
PROTECTOR NERVE ULNAR (MISCELLANEOUS) ×10 IMPLANT
SCRUB PCMX 4 OZ (MISCELLANEOUS) ×5 IMPLANT
SET IRRIG TUBING LAPAROSCOPIC (IRRIGATION / IRRIGATOR) ×5 IMPLANT
SOLUTION ELECTROLUBE (MISCELLANEOUS) ×5 IMPLANT
STAPLER VISISTAT 35W (STAPLE) ×5 IMPLANT
SURGIFLO W/THROMBIN 8M KIT (HEMOSTASIS) ×5 IMPLANT
SUT MNCRL AB 4-0 PS2 18 (SUTURE) ×5 IMPLANT
SUT MON AB 2-0 CT1 27 (SUTURE) ×10 IMPLANT
SUT SILK 3 0 TIES 17X18 (SUTURE) ×2
SUT SILK 3-0 18XBRD TIE BLK (SUTURE) ×8 IMPLANT
SUT VIC AB 0 CTX 36 (SUTURE) ×2
SUT VIC AB 0 CTX36XBRD ANBCTRL (SUTURE) ×8 IMPLANT
SUT VICRYL 0 UR6 27IN ABS (SUTURE) ×5 IMPLANT
SUT VICRYL 4-0 PS2 18IN ABS (SUTURE) IMPLANT
SYR 50ML LL SCALE MARK (SYRINGE) ×5 IMPLANT
SYR CONTROL 10ML LL (SYRINGE) ×10 IMPLANT
TAPE CLOTH SURG 4X10 WHT LF (GAUZE/BANDAGES/DRESSINGS) ×5 IMPLANT
TOWEL OR 17X24 6PK STRL BLUE (TOWEL DISPOSABLE) ×15 IMPLANT
TRAY FOLEY CATH 14FR (SET/KITS/TRAYS/PACK) ×10 IMPLANT
TROCAR 12M 150ML BLUNT (TROCAR) ×5 IMPLANT
TROCAR DILATING TIP 12MM 150MM (ENDOMECHANICALS) ×5 IMPLANT
TROCAR DISP BLADELESS 8 DVNC (TROCAR) ×4 IMPLANT
TROCAR DISP BLADELESS 8MM (TROCAR) ×1
TROCAR XCEL 12X100 BLDLESS (ENDOMECHANICALS) ×5 IMPLANT
TROCAR XCEL NON-BLD 5MMX100MML (ENDOMECHANICALS) ×5 IMPLANT
TUBING FILTER THERMOFLATOR (ELECTROSURGICAL) ×5 IMPLANT
WARMER LAPAROSCOPE (MISCELLANEOUS) ×5 IMPLANT
WATER STERILE IRR 1000ML POUR (IV SOLUTION) ×15 IMPLANT

## 2013-09-27 NOTE — Anesthesia Postprocedure Evaluation (Signed)
  Anesthesia Post-op Note  Anesthesia Post Note  Patient: Kayla [name]  Procedure(s) Performed: Procedure(s) (LRB): ROBOTIC ASSISTED TOTAL HYSTERECTOMY (N/A) BILATERAL SALPINGECTOMY (Bilateral) EXPLORATORY LAPAROTOMY W/ EXPLORATION OF LEFT ILLIAC ARTERY & VEIN (Left)  Anesthesia type: General  Patient location: PACU  Post pain: Pain level controlled  Post assessment: Post-op Vital signs reviewed  Last Vitals:  Filed Vitals:   09/27/13 1545  BP: 109/85  Pulse: 95  Temp:   Resp: 16    Post vital signs: Reviewed  Level of consciousness: sedated  Complications: No apparent anesthesia complications  Patient doing well post-operatively.  Had a history of sleep apnea and used CPAP prior to her gastric bypass.  Had been able to discontinue CPAP use after weight loss.  However, in PACU with narcotics, seems to be having some OSA-related respiratory issues.  Will initiate OSA protocol, and CPAP in PACU, to be continued on floor while patient is on narcotic pain meds.  Charlton Haws, MD

## 2013-09-27 NOTE — Progress Notes (Signed)
Called to PACU to set up CPAP for patient with history of OSA. Pt placed on +8 CPAP and pt was able to tolerate this level of support.  RN at bedside. End tidal CO2 monitor in place with a reading of 33. BBS equal.

## 2013-09-27 NOTE — Transfer of Care (Signed)
Immediate Anesthesia Transfer of Care Note  Patient: Delories Mauri  Procedure(s) Performed: Procedure(s): ROBOTIC ASSISTED TOTAL HYSTERECTOMY (N/A) BILATERAL SALPINGECTOMY (Bilateral) EXPLORATORY LAPAROTOMY W/ EXPLORATION OF LEFT ILLIAC ARTERY & VEIN (Left)  Patient Location: PACU  Anesthesia Type:General  Level of Consciousness: awake, alert  and oriented  Airway & Oxygen Therapy: Patient Spontanous Breathing and Patient connected to nasal cannula oxygen  Post-op Assessment: Report given to PACU RN and Post -op Vital signs reviewed and stable  Post vital signs: Reviewed and stable  Complications: No apparent anesthesia complications

## 2013-09-27 NOTE — Plan of Care (Signed)
Problem: Phase I Progression Outcomes Goal: Pain controlled with appropriate interventions Outcome: Completed/Met Date Met:  09/27/13 Good pain control on IV Dilaudid. Goal: VS, stable, temp < 100.4 degrees F Outcome: Progressing Tolerated standing at side of bed. Goal: I & O every 4 hrs or as ordered Outcome: Completed/Met Date Met:  09/27/13 Foley to s/d clear tea colored urine. Goal: IS, TCDB as ordered Outcome: Completed/Met Date Met:  09/27/13 Able to I/S up to 1250 at this time. Goal: Other Phase I Outcomes/Goals Outcome: Progressing Tolerating clear liquids at this time.

## 2013-09-27 NOTE — Progress Notes (Signed)
Patient doing well post-operatively.  Had a history of sleep apnea and used CPAP prior to her gastric bypass.  Had been able to discontinue CPAP use after weight loss.  However, in PACU with narcotics, seems to be having some OSA-related respiratory issues.  Will initiate OSA protocol, and CPAP in PACU, to be continued on floor while patient is on narcotic pain meds.  Charlton Haws, MD

## 2013-09-27 NOTE — Anesthesia Procedure Notes (Signed)
Procedure Name: Intubation Date/Time: 09/27/2013 9:40 AM Performed by: Jonna Munro Pre-anesthesia Checklist: Patient being monitored, Suction available, Emergency Drugs available, Patient identified and Timeout performed Patient Re-evaluated:Patient Re-evaluated prior to inductionOxygen Delivery Method: Circle system utilized Preoxygenation: Pre-oxygenation with 100% oxygen Intubation Type: IV induction Ventilation: Mask ventilation without difficulty Laryngoscope Size: Mac and 3 Grade View: Grade III Tube type: Oral Tube size: 7.0 mm Number of attempts: 2 Airway Equipment and Method: Stylet Placement Confirmation: positive ETCO2,  breath sounds checked- equal and bilateral and ETT inserted through vocal cords under direct vision Secured at: 21 cm Tube secured with: Tape Dental Injury: Teeth and Oropharynx as per pre-operative assessment  Difficulty Due To: Difficult Airway- due to limited oral opening and Difficult Airway- due to large tongue Comments: DLx1 by CRNA, unable to visualize cords, easy mask airway, O2 Sat maintained at 100%. DL by Dr. Glennon Mac, gr III view, placement of ETT confirmed as above.

## 2013-09-27 NOTE — Anesthesia Preprocedure Evaluation (Addendum)
Anesthesia Evaluation  Patient identified by MRN, date of birth, ID band Patient awake    Reviewed: Allergy & Precautions, H&P , Patient's Chart, lab work & pertinent test results, reviewed documented beta blocker date and time   History of Anesthesia Complications Negative for: history of anesthetic complications  Airway Mallampati: II TM Distance: >3 FB Neck ROM: full    Dental   Pulmonary Current Smoker,  breath sounds clear to auscultation        Cardiovascular Exercise Tolerance: Good Rhythm:regular Rate:Normal     Neuro/Psych    GI/Hepatic   Endo/Other    Renal/GU      Musculoskeletal   Abdominal   Peds  Hematology  (+) anemia ,   Anesthesia Other Findings   Reproductive/Obstetrics                          Anesthesia Physical Anesthesia Plan  ASA: II  Anesthesia Plan: General ETT   Post-op Pain Management:    Induction:   Airway Management Planned:   Additional Equipment:   Intra-op Plan:   Post-operative Plan:   Informed Consent: I have reviewed the patients History and Physical, chart, labs and discussed the procedure including the risks, benefits and alternatives for the proposed anesthesia with the patient or authorized representative who has indicated his/her understanding and acceptance.   Dental Advisory Given  Plan Discussed with: CRNA and Surgeon  Anesthesia Plan Comments:         Anesthesia Quick Evaluation

## 2013-09-27 NOTE — Op Note (Signed)
    Patient name: Kayla [name] MRN: [id number] DOB: 03/01/1971 Sex: female  09/27/2013 Pre-operative Diagnosis: Bleeding from robotic hysterectomy Post-operative diagnosis:  Same Surgeon:  Serafina Mitchell Assistants:  Carlyn Reichert Procedure:   Exploration of right iliac artery, ligation of venous branches Anesthesia:  General Blood Loss:  See anesthesia record Specimens:  None  Findings:  No evidence of arterial bleeding.  No significant venous bleeding  Indications:  I was called for an intra-op consult.  The patient underwent robotic assisted hysterectomy.  A large hematoma was identified which required opening of the abdomen for exploration.  A vascular consult was called for further evaluation  Procedure: I was called for vascular consultation to [place] [facility]  For bleeding evaluation.  When I arrived, the abdomen was open and the left pelvis was exposed.  No active bleeding was encountered at that time.  I further dissected out the iliac artery.  After careful evaluation, there was no evidence of arterial bleeding.  Several venous branches were ligated.  No definitive source of bleeding was identified.  I monitored the area of concern for a while, and could not identify the source.  Presumably there was a venous source which had stopped.  After careful exploration and monitoring, I felt it was safe to close.  Flo-seal was placed over the area.  Gyn closed the wound.   Disposition:  To PACU in stable condition.   Theotis Burrow, M.D. [facility] Office: [phone] Pager:  [phone]

## 2013-09-27 NOTE — Op Note (Addendum)
Pre-operative Diagnosis: abnormal uterine bleeding and fibroids  Post-operative Diagnosis: same  Operation: Robotic-assisted hysterectomy with bilateral salpingectomy  Surgeon: Lahoma Crocker  Assistant: Baltazar Najjar, MD  Anesthesia: GET  Urine Output: per Anesthesiology  Findings: 12 week size uterus with diffuse fibroid involvement  Estimated Blood Loss:  500 ml                 Total IV Fluids: per Anesthesiology         Specimens: PATHOLOGY               Complications:  Left pelvic sidewall hematoma         Disposition: PACU - hemodynamically stable.         Condition: Stable    Procedure Details  The patient was seen in the Holding Room. The risks, benefits, complications, treatment options, and expected outcomes were discussed with the patient.  The patient concurred with the proposed plan, giving informed consent.  The site of surgery properly noted/marked. The patient was identified as Kayla [name] and the procedure verified as a Robotic-assisted hysterectomy with bilateral salpingectomy. A Time Out was held and the above information confirmed.  After induction of anesthesia, the patient was draped and prepped in the usual sterile manner. Pt was placed in supine position after anesthesia and draped and prepped in the usual sterile manner. The abdominal drape was placed after the CholoraPrep had been allowed to dry for 3 minutes.  Her arms were tucked to her side with all appropriate precautions.   The patient was placed in the semi-lithotomy position in [place].  The perineum was prepped with Betadine.  Foley catheter was placed.  A sterile speculum was placed in the vagina.  The cervix was grasped with a single-tooth tenaculum and dilated with Kennon Rounds dilators.  The ZUMI uterine manipulator with a large colpotomizer ring was placed without difficulty.  A pneum occluder balloon was placed over the manipulator.  A second time-out was performed.  OG tube  placement was confirmed and to suction.  Approximately 2 cm above the umbilicus the skin was anesthestized with 0.25% Marcaine.  A 5 mm incision was made and using a 5 mm Optiview, a 5 mm trocar was placed under direct vision.  The patient's abdomen was insufflated with CO2 gas.  At this point and all points during the procedure, the patient's intra-abdominal pressure did not exceed 15 mmHg.  Bilateral 8 mm ports were place 10 cm and 15 degrees inferior. The umbilical incision was extended to accommodate a 10-12 camera port.  All ports were placed under direct visualization. The robot was docked in the usual fashion.  The round ligament on the right was transected with monopolar cautery.  The anterior and posterior leaves of the broad ligament were opened.   A window was made in the broad ligament inferior to the utero ovarian ligament.  The right mesosalpinx was transected with monopolar cautery.  The proximal fallopian tube was coagulated with bipolar cautery and excised.  The fallopian tube was removed from the abdomen.  The right utero-ovarian ligament and proximal fallopian tube were coagulated with bipolar cautery and transected.  The uterine vessels were skeletonized to the level of the Koh ring. The uterine vessels were coagulated with bipolar cautery and transected.   A C-loop was created in the usual fashion.  A similar procedure was performed on the patient's left side. The round ligament on the left was transected with monopolar cautery.  The anterior and posterior  leaves of the broad ligament were opened.  A window was made in the broad ligament inferior to the utero ovarian ligament.  The right mesosalpinx was transected with monopolar cautery.  The proximal fallopian tube was coagulated with bipolar cautery and excised.  The fallopian tube was removed from the abdomen.  The left utero-ovarian ligament and proximal fallopian tube were coagulated with bipolar cautery and transected.  The uterine vessels  were skeletonized to the level of the Koh ring. The uterine vessels were coagulated with bipolar cautery and transected.  A C-loop was created in the usual fashion.   The bladder flap was completed.  Multiple fibroids were excised using monopolar cautery.  At this time, the pneum occluder balloon was insufflated and a colpotomy was performed.  An attempt was made to deliver the uterus, cervix, bilateral tubes and ovaries through the vagina. A single tooth tenaculum grasped an area of peritoneum on the left pelvic side wall.  A left-sided retroperitoneal hematoma was noted.   The robot was undocked.  A vascular surgery consult was called.  A midline vertical incision was made with scalpel and carried down to the fascial layer.  The fascia was incised along the length of the incision.  The parieto peritoneum was identified and entered.  A Book walter retractor was placed into the incision and the bowel was packed away with moistened laparotomy packs.  The pelvis was packed.  The packs were then removed.  A clot covered the external vessels.  There was no focal bleeding point.  The para rectal space was opened.  The external iliac vessels were identified. The uterus and cervix were removed from the abdomen.  The vaginal cuff was closed with interrupted sutures of 0-Vicryl.  The pelvis was irrigated.  Adequate hemostasis was noted.   Please see the dictated intraoperative consult by vascular surgery.    Deep, subcutaneous, figure-of-eight 0-Vicryl sutures on a UR-6 needle were placed in the 71-06 mm supraumbilical and infracostal incisions.  All skin incisions were closed in a subcuticular fashion using 3-0 Monocryl.  A skin adhesive was applied.  The fascia of the midline incision was closed with two running sutures of 0-Vicryl.  The wound was infiltrated with liposomal bupivicaine.  The skin was closed with staples.   All instrument and needle counts were correct x  2.

## 2013-09-27 NOTE — Interval H&P Note (Signed)
History and Physical Interval Note:  09/27/2013 9:20 AM  Kayla [name]  has presented today for surgery, with the diagnosis of Uterine fibroids- symptomatic  The various methods of treatment have been discussed with the patient and family. After consideration of risks, benefits and other options for treatment, the patient has consented to  Procedure(s): ROBOTIC ASSISTED TOTAL HYSTERECTOMY (N/A) LAPAROSCOPIC BILATERAL SALPINGECTOMY (Bilateral) as a surgical intervention .  The patient's history has been reviewed, patient examined, no change in status, stable for surgery.  I have reviewed the patient's chart and labs.  Questions were answered to the patient's satisfaction.     Lahoma Crocker

## 2013-09-28 LAB — BASIC METABOLIC PANEL
BUN: 11 mg/dL (ref 6–23)
CHLORIDE: 101 meq/L (ref 96–112)
CO2: 26 meq/L (ref 19–32)
CREATININE: 0.62 mg/dL (ref 0.50–1.10)
Calcium: 7.5 mg/dL — ABNORMAL LOW (ref 8.4–10.5)
GFR calc non Af Amer: >90 mL/min (ref >90)
Glucose, Bld: 101 mg/dL — ABNORMAL HIGH (ref 70–99)
Potassium: 3.6 mEq/L — ABNORMAL LOW (ref 3.7–5.3)
Sodium: 135 mEq/L — ABNORMAL LOW (ref 137–147)

## 2013-09-28 LAB — CBC
HCT: 27.4 % — ABNORMAL LOW (ref 36.0–46.0)
Hemoglobin: 8.9 g/dL — ABNORMAL LOW (ref 12.0–15.0)
MCH: 28 pg (ref 26.0–34.0)
MCHC: 32.5 g/dL (ref 30.0–36.0)
MCV: 86.2 fL (ref 78.0–100.0)
Platelets: 193 10*3/uL (ref 150–400)
RBC: 3.18 MIL/uL — ABNORMAL LOW (ref 3.87–5.11)
RDW: 18.8 % — ABNORMAL HIGH (ref 11.5–15.5)
WBC: 9.9 10*3/uL (ref 4.0–10.5)

## 2013-09-28 MED ORDER — HYDROMORPHONE HCL PF 1 MG/ML IJ SOLN: 0.5000 mg | INTRAMUSCULAR | Status: DC | PRN

## 2013-09-28 MED ORDER — HYDROMORPHONE HCL 2 MG PO TABS
2.0000 mg | ORAL_TABLET | ORAL | Status: DC | PRN
  Administered 2013-09-28: 4 mg via ORAL
  Filled 2013-09-28: qty 2

## 2013-09-28 MED ORDER — TRAMADOL HCL 50 MG PO TABS
100.0000 mg | ORAL_TABLET | Freq: Four times a day (QID) | ORAL | Status: DC
  Administered 2013-09-28 – 2013-09-29 (×5): 100 mg via ORAL
  Filled 2013-09-28 (×5): qty 2

## 2013-09-28 NOTE — Addendum Note (Signed)
Addendum created 09/28/13 1028 by Flossie Dibble, CRNA   Modules edited: Notes Section   Notes Section:  File: [id number]

## 2013-09-28 NOTE — Anesthesia Postprocedure Evaluation (Signed)
Anesthesia Post Note  Patient: Kayla [name]  Procedure(s) Performed: Procedure(s): ROBOTIC ASSISTED TOTAL HYSTERECTOMY (N/A) BILATERAL SALPINGECTOMY (Bilateral) EXPLORATORY LAPAROTOMY W/ EXPLORATION OF LEFT ILLIAC ARTERY & VEIN (Left)  Anesthesia type: General  Patient location: [facility]  Post pain: Pain level controlled  Post assessment: Post-op Vital signs reviewed  Last Vitals: BP 105/68  Pulse 86  Temp(Src) 37 C (Oral)  Resp 16  Ht 5\' 5"  (1.651 m)  Wt 201 lb (91.173 kg)  BMI 33.45 kg/m2  SpO2 100%  Post vital signs: Reviewed  Level of consciousness: awake  Complications: No apparent anesthesia complications

## 2013-09-28 NOTE — Progress Notes (Signed)
Patient ID: Kayla [name], female   DOB: 12-19-1970, 43 y.o.   MRN: [id number] 1 Day Post-Op Procedure(s) (LRB): ROBOTIC ASSISTED TOTAL HYSTERECTOMY (N/A) BILATERAL SALPINGECTOMY (Bilateral) EXPLORATORY LAPAROTOMY W/ EXPLORATION OF LEFT ILLIAC ARTERY & VEIN (Left)  Subjective: Patient reports back pain   Objective: Vital signs in last 24 hours: Temp:  [97.6 F (36.4 C)-98.8 F (37.1 C)] 98.6 F (37 C) (05/23 1011) Pulse Rate:  [80-97] 86 (05/23 1011) Resp:  [11-20] 16 (05/23 0558) BP: (83-119)/(42-90) 105/68 mmHg (05/23 1011) SpO2:  [92 %-100 %] 100 % (05/23 1011) Weight:  [91.173 kg (201 lb)] 91.173 kg (201 lb) (05/22 1758) Last BM Date: 09/25/13  Intake/Output from previous day: 05/22 0701 - 05/23 0700 In: 4514.2 [P.O.:360; I.V.:4154.2] Out: 1490 [Urine:990; Blood:500] Intake/Output this shift: Total I/O In: -  Out: 450 [Urine:450]  Physical Examination:  General: alert Resp: clear to auscultation bilaterally Cardio: regular rate and rhythm, S1, S2 normal, no murmur, click, rub or gallop GI: soft, non-tender; bowel sounds normal; no masses,  no organomegaly Extremities: extremities normal, atraumatic, no cyanosis or edema Vaginal Bleeding: none   Labs:  WBC/Hgb/Hct/Plts:  9.9/8.9/27.4/193 (05/23 0520) BUN/Cr/glu/ALT/AST/amyl/lip:  11/0.62/--/--/--/--/-- (05/23 0520)  Assessment:  43 y.o. s/p Procedure(s): ROBOTIC ASSISTED TOTAL HYSTERECTOMY BILATERAL SALPINGECTOMY EXPLORATORY LAPAROTOMY W/ EXPLORATION OF LEFT ILLIAC ARTERY & VEIN: stable  Pain: Pain is well-controlled on  oral medications.  Heme:Anemia: secondary intraoperative bleed  GI:  Tolerating po: Yes    Prophylaxis: intermittent pneumatic compression boots.  Plan: Advance diet Encourage ambulation D/C foley K pad to lower back  LOS: 1 day     Lahoma Crocker 09/28/2013, 10:52 AM

## 2013-09-29 LAB — TYPE AND SCREEN
ABO/RH(D): O POS
Antibody Screen: NEGATIVE
UNIT DIVISION: 0
Unit division: 0

## 2013-09-29 LAB — HEMOGLOBIN AND HEMATOCRIT, BLOOD
HCT: 28.3 % — ABNORMAL LOW (ref 36.0–46.0)
Hemoglobin: 8.8 g/dL — ABNORMAL LOW (ref 12.0–15.0)

## 2013-09-29 MED ORDER — TRAMADOL HCL 50 MG PO TABS: 100.0000 mg | ORAL_TABLET | Freq: Four times a day (QID) | ORAL | Status: DC | PRN

## 2013-09-29 NOTE — Progress Notes (Signed)
Discharge instructions reviewed with patient.  Patient states understanding of home care.  No home equipment needed.  Patients significant other will assist with her care at home.  Patient discharged via wheelchair in stable condition with staff without incident.

## 2013-09-29 NOTE — Discharge Summary (Signed)
Physician Discharge Summary  Patient ID: Kayla [name] MRN: [id number] DOB/AGE: 1970-12-04 43 y.o.  Admit date: 09/27/2013 Discharge date: 09/29/2013  Admission Diagnoses: Active Problems:   Leiomyoma of uterus, unspecified   Postoperative anemia due to acute blood loss  Discharge Diagnoses:  Active Problems:   Leiomyoma of uterus, unspecified   Postoperative anemia due to acute blood loss   Discharged Condition: stable  Hospital Course: On 09/27/2013, the patient underwent the following: Procedure(s): ROBOTIC ASSISTED TOTAL HYSTERECTOMY BILATERAL SALPINGECTOMY EXPLORATORY LAPAROTOMY W/ EXPLORATION OF LEFT ILLIAC ARTERY & VEIN.   The postoperative course was uneventful. The hemoglobin nadir stabilized at 8.9.  She was discharged to home on postoperative day 2 tolerating a regular diet.  Consults: Vascular Surgery--intraoperative  Significant Diagnostic Studies: none  Treatments: surgery: see above  Discharge Exam: Blood pressure 113/79, pulse 85, temperature 97.9 F (36.6 C), temperature source Oral, resp. rate 18, height 5\' 5"  (1.651 m), weight 91.173 kg (201 lb), SpO2 100.00%. General appearance: alert Resp: clear to auscultation bilaterally Cardio: regular rate and rhythm, S1, S2 normal, no murmur, click, rub or gallop GI: soft, non-tender; bowel sounds normal; no masses,  no organomegaly Extremities: extremities normal, atraumatic, no cyanosis or edema Incision/Wound: C/D/I PV loss: none  Disposition: Final discharge disposition not confirmed  Discharge Instructions   Call MD for:  extreme fatigue    Complete by:  As directed      Call MD for:  persistant dizziness or light-headedness    Complete by:  As directed      Call MD for:  persistant nausea and vomiting    Complete by:  As directed      Call MD for:  redness, tenderness, or signs of infection (pain, swelling, redness, odor or green/yellow discharge around incision site)    Complete by:  As directed      Call MD for:  severe uncontrolled pain    Complete by:  As directed      Call MD for:  temperature >100.4    Complete by:  As directed      Diet - low sodium heart healthy    Complete by:  As directed      Discharge wound care:    Complete by:  As directed   Keep clean and dry     Driving Restrictions    Complete by:  As directed   No driving for  2 weeks     Increase activity slowly    Complete by:  As directed      Lifting restrictions    Complete by:  As directed   No lifting > 5 lbs for 6 weeks     May shower / Bathe    Complete by:  As directed   No tub baths for 6 weeks     May walk up steps    Complete by:  As directed      Sexual Activity Restrictions    Complete by:  As directed   No intercourse for  8 weeks            Medication List         ferrous sulfate 325 (65 FE) MG tablet  Take 650 mg by mouth daily with breakfast.     multivitamin with minerals Tabs tablet  Take 1 tablet by mouth daily.     traMADol 50 MG tablet  Commonly known as:  ULTRAM  Take 2 tablets (100 mg total) by mouth every 6 (six) hours as  needed.           Follow-up Information   Follow up with Agnes Lawrence, MD. Schedule an appointment as soon as possible for a visit on 10/07/2013. (For suture removal)    Specialty:  Obstetrics and Gynecology   Contact information:   [address] [place] [address] [phone]       Signed: Lahoma Crocker 09/29/2013, 8:18 AM

## 2013-09-29 NOTE — Discharge Instructions (Signed)
Iron Deficiency Anemia, Adult Anemia is when you have a low number of healthy red blood cells. It is often caused by too little iron. This is called iron deficiency anemia. It may make you tired and short of breath. HOME CARE   Take iron as told by your doctor.  Take vitamins as told by your doctor.  Eat foods that have iron in them. This includes liver, lean beef, whole-grain bread, eggs, dried fruit, and dark green leafy vegetables. GET HELP RIGHT AWAY IF:  You pass out (faint).  You have chest pain.  You feel sick to your stomach (nauseous) or throw up (vomit).  You get very short of breath with activity.  You are weak.  You have a fast heartbeat.  You start to sweat for no reason.  You become lightheaded when getting up from a chair or bed. MAKE SURE YOU:  Understand these instructions.  Will watch your condition.  Will get help right away if you are not doing well or get worse. Document Released: 05/28/2010 Document Revised: 02/13/2013 Document Reviewed: 12/31/2012 [facility] Patient Information 2014 [place]. Hysterectomy Care After Refer to this sheet in the next few weeks. These instructions provide you with information on caring for yourself after your procedure. Your caregiver may also give you more specific instructions. Your treatment has been planned according to current medical practices, but problems sometimes occur. Call your caregiver if you have any problems or questions after your procedure. HOME CARE INSTRUCTIONS  Healing will take time. You may have discomfort, tenderness, swelling, and bruising at the surgical site for about 2 weeks. This is normal and will get better as time goes on.  Only take over-the-counter or prescription medicines for pain, discomfort, or fever as directed by your caregiver.  Do not take aspirin. It can cause bleeding.  Do not drive when taking pain medicine.  Follow your caregiver's advice regarding exercise, lifting,  driving, and general activities.  Resume your usual diet as directed and allowed.  Get plenty of rest and sleep.  Do not douche, use tampons, or have sexual intercourse for at least 6 weeks or until your caregiver gives you permission.  Change your bandages (dressings) as directed by your caregiver.  Monitor your temperature.  Take showers instead of baths for 2 to 3 weeks.  Do not drink alcohol until your caregiver gives you permission.  If you are constipated, you may take a mild laxative with your caregiver's permission. Bran foods may help with constipation problems. Drinking enough fluids to keep your urine clear or pale yellow may help as well.  Try to have someone home with you for 1 or 2 weeks to help around the house.  Keep all of your follow-up appointments as directed by your caregiver. SEEK MEDICAL CARE IF:   You have swelling, redness, or increasing pain in the surgical cut (incision) area.  You have pus coming from the incision.  You notice a bad smell coming from the incision or dressing.  You have swelling, redness, or pain around the intravenous (IV) site.  Your incision breaks open.  You feel dizzy or lightheaded.  You have pain or bleeding when you urinate.  You have persistent diarrhea.  You have persistent nausea and vomiting.  You have abnormal vaginal discharge.  You have a rash.  You have any type of abnormal reaction or develop an allergy to your medicine.  Your pain is not controlled with your prescribed medicine. SEEK IMMEDIATE MEDICAL CARE IF:  You have a fever.  You have severe abdominal pain.  You have chest pain.  You have shortness of breath.  You faint.  You have pain, swelling, or redness of your leg.  You have heavy vaginal bleeding with blood clots. MAKE SURE YOU:  Understand these instructions.  Will watch your condition.  Will get help right away if you are not doing well or get worse. Document Released:  11/12/2004 Document Revised: 07/18/2011 Document Reviewed: 12/10/2010 [facility] Patient Information 2014 [place].

## 2013-09-30 ENCOUNTER — Encounter (HOSPITAL_COMMUNITY): Payer: Self-pay | Admitting: Obstetrics & Gynecology

## 2013-10-01 ENCOUNTER — Telehealth: Payer: Self-pay | Admitting: *Deleted

## 2013-10-01 NOTE — Telephone Encounter (Signed)
Patient called - States she had surgery on Friday and has not had a BM since. She is using stool softners regularly and wants to know if she can use a enema. Per Dr Jodi Mourning: Call to patient may use MOM 2 Tbs am/pm until patient has BM. Eat foods that promote bowels moving- warm prune juice. No enema. Patient instructed to call back if no BM in 24-48 hours.

## 2013-10-01 NOTE — Progress Notes (Signed)
Post discharge chart review completed.  

## 2013-10-07 ENCOUNTER — Ambulatory Visit (INDEPENDENT_AMBULATORY_CARE_PROVIDER_SITE_OTHER): Payer: BC Managed Care – PPO | Admitting: Obstetrics & Gynecology

## 2013-10-07 ENCOUNTER — Encounter: Payer: Self-pay | Admitting: Obstetrics & Gynecology

## 2013-10-07 VITALS — BP 112/78 | HR 103 | Temp 98.3°F | Wt 185.0 lb

## 2013-10-07 DIAGNOSIS — Z09 Encounter for follow-up examination after completed treatment for conditions other than malignant neoplasm: Secondary | ICD-10-CM

## 2013-10-07 MED ORDER — DIAZEPAM 5 MG PO TABS: 5.0000 mg | ORAL_TABLET | Freq: Two times a day (BID) | ORAL | Status: DC | PRN

## 2013-10-07 NOTE — Patient Instructions (Signed)

## 2013-10-07 NOTE — Progress Notes (Signed)
Subjective:     Kayla [name] is a 43 y.o. female who presents to the clinic 1 weeks status post TRH for fibroids. Eating a regular diet without difficulty. Bowel movements are painful..   The following portions of the patient's history were reviewed and updated as appropriate: allergies, current medications, past family history, past medical history, past social history, past surgical history and problem list.  Review of Systems Pertinent items are noted in HPI.    Objective:    BP 112/78  Pulse 103  Temp(Src) 98.3 F (36.8 C)  Wt 83.915 kg (185 lb)  LMP 08/02/2013 General:  alert  Abdomen: soft, bowel sounds active, non-tender  Incision:   healing well, no drainage, no erythema, no hernia, no seroma, no swelling, no dehiscence, incision well approximated       Assessment:   ?etiology of painful defecation Operative findings again reviewed.     Plan:   Meds ordered this encounter  Medications  . diazepam (VALIUM) 5 MG tablet    Sig: Take 1 tablet (5 mg total) by mouth every 12 (twelve) hours as needed for muscle spasms.    Dispense:  14 tablet    Refill:  0  Miralax, simethicone  1. Continue any current medications. 2. Wound care discussed. 3. Activity restrictions: no bending, stooping, or squatting, no lifting more than 5 pounds and pelvic rest 4. Anticipated return to work: 4 weeks. 5. Follow up: 1 week.

## 2013-10-16 ENCOUNTER — Ambulatory Visit (INDEPENDENT_AMBULATORY_CARE_PROVIDER_SITE_OTHER): Payer: BC Managed Care – PPO | Admitting: Obstetrics & Gynecology

## 2013-10-16 DIAGNOSIS — Z09 Encounter for follow-up examination after completed treatment for conditions other than malignant neoplasm: Secondary | ICD-10-CM

## 2013-10-18 ENCOUNTER — Encounter: Payer: Self-pay | Admitting: Obstetrics & Gynecology

## 2013-10-18 NOTE — Progress Notes (Signed)
Subjective:     Kayla [name] is a 43 y.o. female who presents to the clinic 2 weeks status post TRH for fibroids. Eating a regular diet without difficulty.   The following portions of the patient's history were reviewed and updated as appropriate: allergies, current medications, past family history, past medical history, past social history, past surgical history and problem list.  Review of Systems Pertinent items are noted in HPI.    Objective:    LMP 08/02/2013 General:  alert  Abdomen: soft, bowel sounds active, non-tender  Incision:   healing well, no drainage, no erythema, no hernia, no seroma, no swelling, no dehiscence, incision well approximated       Assessment:   Painful defecation resolved     Plan:     1. Continue any current medications. 2. Wound care discussed. 3. Activity restrictions: no bending, stooping, or squatting, no lifting more than 5 pounds and pelvic rest 4. Anticipated return to work: 4 weeks. 5. Follow up: 4 weeks.

## 2013-11-01 ENCOUNTER — Ambulatory Visit (INDEPENDENT_AMBULATORY_CARE_PROVIDER_SITE_OTHER): Payer: BC Managed Care – PPO | Admitting: Obstetrics & Gynecology

## 2013-11-01 ENCOUNTER — Encounter: Payer: Self-pay | Admitting: Obstetrics & Gynecology

## 2013-11-01 VITALS — BP 116/83 | HR 97 | Temp 98.3°F | Wt 194.0 lb

## 2013-11-01 DIAGNOSIS — Z09 Encounter for follow-up examination after completed treatment for conditions other than malignant neoplasm: Secondary | ICD-10-CM

## 2013-11-01 DIAGNOSIS — L0390 Cellulitis, unspecified: Secondary | ICD-10-CM

## 2013-11-01 DIAGNOSIS — L0291 Cutaneous abscess, unspecified: Secondary | ICD-10-CM

## 2013-11-01 MED ORDER — SULFAMETHOXAZOLE-TMP DS 800-160 MG PO TABS: 1.0000 | ORAL_TABLET | Freq: Two times a day (BID) | ORAL | Status: DC

## 2013-11-03 NOTE — Progress Notes (Signed)
Subjective:     Azalie Harbeck is a 43 y.o. female who presents to the clinic 4 weeks status post TRH for fibroids. Eating a regular diet without difficulty.   The following portions of the patient's history were reviewed and updated as appropriate: allergies, current medications, past family history, past medical history, past social history, past surgical history and problem list.  Review of Systems Pertinent items are noted in HPI.    Objective:    BP 116/83  Pulse 97  Temp(Src) 98.3 F (36.8 C)  Wt 87.998 kg (194 lb)  LMP 08/02/2013 General:  alert  Abdomen: soft, bowel sounds active, non-tender  Incision:   healing well, no drainage, no erythema, no hernia, no seroma, no swelling, no dehiscence, incision well approximated    Skin inner right thigh--small indurated area   Assessment:   Doing well  Boil   Plan:   Meds ordered this encounter  Medications  . sulfamethoxazole-trimethoprim (BACTRIM DS) 800-160 MG per tablet    Sig: Take 1 tablet by mouth 2 (two) times daily.    Dispense:  14 tablet    Refill:  0    1. Continue any current medications. 2. Wound care discussed. 3. Activity restrictions: no bending, stooping, or squatting, no lifting more than 5 pounds and pelvic rest 4. Anticipated return to work: 1-2 weeks. 5. Follow up: 4 weeks.

## 2013-11-03 NOTE — Patient Instructions (Signed)

## 2013-11-05 ENCOUNTER — Encounter: Payer: Self-pay | Admitting: *Deleted

## 2013-11-27 ENCOUNTER — Telehealth: Payer: Self-pay | Admitting: *Deleted

## 2013-11-27 NOTE — Telephone Encounter (Signed)
Left message with her bf that we had to cx her appt and that we could r/s her for tom afternoon with Delsa Sale

## 2013-12-02 ENCOUNTER — Ambulatory Visit: Payer: BC Managed Care – PPO | Admitting: Obstetrics & Gynecology

## 2014-03-05 ENCOUNTER — Ambulatory Visit: Payer: BC Managed Care – PPO | Admitting: Obstetrics & Gynecology

## 2014-03-10 ENCOUNTER — Encounter: Payer: Self-pay | Admitting: Obstetrics & Gynecology

## 2014-03-26 ENCOUNTER — Encounter: Payer: Self-pay | Admitting: Obstetrics & Gynecology

## 2014-03-26 ENCOUNTER — Ambulatory Visit (INDEPENDENT_AMBULATORY_CARE_PROVIDER_SITE_OTHER): Payer: 59 | Admitting: Obstetrics & Gynecology

## 2014-03-26 VITALS — BP 124/90 | HR 83 | Temp 98.1°F | Wt 191.0 lb

## 2014-03-26 DIAGNOSIS — Z716 Tobacco abuse counseling: Secondary | ICD-10-CM

## 2014-03-26 DIAGNOSIS — Z720 Tobacco use: Secondary | ICD-10-CM

## 2014-03-26 DIAGNOSIS — R399 Unspecified symptoms and signs involving the genitourinary system: Secondary | ICD-10-CM

## 2014-03-26 LAB — POCT URINALYSIS DIPSTICK
Bilirubin, UA: NEGATIVE
GLUCOSE UA: NEGATIVE
KETONES UA: NEGATIVE
Leukocytes, UA: NEGATIVE
Nitrite, UA: POSITIVE
Spec Grav, UA: 1.02
Urobilinogen, UA: NEGATIVE
pH, UA: 6

## 2014-03-26 MED ORDER — NICOTINE 21 MG/24HR TD PT24: 21.0000 mg | MEDICATED_PATCH | Freq: Every day | TRANSDERMAL | Status: DC

## 2014-03-26 MED ORDER — SULFAMETHOXAZOLE-TRIMETHOPRIM 800-160 MG PO TABS: 1.0000 | ORAL_TABLET | Freq: Two times a day (BID) | ORAL | Status: DC

## 2014-03-26 NOTE — Patient Instructions (Signed)
Smoking Cessation Quitting smoking is important to your health and has many advantages. However, it is not always easy to quit since nicotine is a very addictive drug. Oftentimes, people try 3 times or more before being able to quit. This document explains the best ways for you to prepare to quit smoking. Quitting takes hard work and a lot of effort, but you can do it. ADVANTAGES OF QUITTING SMOKING  You will live longer, feel better, and live better.  Your body will feel the impact of quitting smoking almost immediately.  Within 20 minutes, blood pressure decreases. Your pulse returns to its normal level.  After 8 hours, carbon monoxide levels in the blood return to normal. Your oxygen level increases.  After 24 hours, the chance of having a heart attack starts to decrease. Your breath, hair, and body stop smelling like smoke.  After 48 hours, damaged nerve endings begin to recover. Your sense of taste and smell improve.  After 72 hours, the body is virtually free of nicotine. Your bronchial tubes relax and breathing becomes easier.  After 2 to 12 weeks, lungs can hold more air. Exercise becomes easier and circulation improves.  The risk of having a heart attack, stroke, cancer, or lung disease is greatly reduced.  After 1 year, the risk of coronary heart disease is cut in half.  After 5 years, the risk of stroke falls to the same as a nonsmoker.  After 10 years, the risk of lung cancer is cut in half and the risk of other cancers decreases significantly.  After 15 years, the risk of coronary heart disease drops, usually to the level of a nonsmoker.  If you are pregnant, quitting smoking will improve your chances of having a healthy baby.  The people you live with, especially any children, will be healthier.  You will have extra money to spend on things other than cigarettes. QUESTIONS TO THINK ABOUT BEFORE ATTEMPTING TO QUIT You may want to talk about your answers with your  health care provider.  Why do you want to quit?  If you tried to quit in the past, what helped and what did not?  What will be the most difficult situations for you after you quit? How will you plan to handle them?  Who can help you through the tough times? Your family? Friends? A health care provider?  What pleasures do you get from smoking? What ways can you still get pleasure if you quit? Here are some questions to ask your health care provider:  How can you help me to be successful at quitting?  What medicine do you think would be best for me and how should I take it?  What should I do if I need more help?  What is smoking withdrawal like? How can I get information on withdrawal? GET READY  Set a quit date.  Change your environment by getting rid of all cigarettes, ashtrays, matches, and lighters in your home, car, or work. Do not let people smoke in your home.  Review your past attempts to quit. Think about what worked and what did not. GET SUPPORT AND ENCOURAGEMENT You have a better chance of being successful if you have help. You can get support in many ways.  Tell your family, friends, and coworkers that you are going to quit and need their support. Ask them not to smoke around you.  Get individual, group, or telephone counseling and support. Programs are available at local hospitals and health centers. Call   your local health department for information about programs in your area.  Spiritual beliefs and practices may help some smokers quit.  Download a "quit meter" on your computer to keep track of quit statistics, such as how long you have gone without smoking, cigarettes not smoked, and money saved.  Get a self-help book about quitting smoking and staying off tobacco. [place] yourself from urges to smoke. Talk to someone, go for a walk, or occupy your time with a task.  Change your normal routine. Take a different route to work.  Drink tea instead of coffee. Eat breakfast in a different place.  Reduce your stress. Take a hot bath, exercise, or read a book.  Plan something enjoyable to do every day. Reward yourself for not smoking.  Explore interactive web-based programs that specialize in helping you quit. GET MEDICINE AND USE IT CORRECTLY Medicines can help you stop smoking and decrease the urge to smoke. Combining medicine with the above behavioral methods and support can greatly increase your chances of successfully quitting smoking.  Nicotine replacement therapy helps deliver nicotine to your body without the negative effects and risks of smoking. Nicotine replacement therapy includes nicotine gum, lozenges, inhalers, nasal sprays, and skin patches. Some may be available over-the-counter and others require a prescription.  Antidepressant medicine helps people abstain from smoking, but how this works is unknown. This medicine is available by prescription.  Nicotinic receptor partial agonist medicine simulates the effect of nicotine in your brain. This medicine is available by prescription. Ask your health care provider for advice about which medicines to use and how to use them based on your health history. Your health care provider will tell you what side effects to look out for if you choose to be on a medicine or therapy. Carefully read the information on the package. Do not use any other product containing nicotine while using a nicotine replacement product.  RELAPSE OR DIFFICULT SITUATIONS Most relapses occur within the first 3 months after quitting. Do not be discouraged if you start smoking again. Remember, most people try several times before finally quitting. You may have symptoms of withdrawal because your body is used to nicotine. You may crave cigarettes, be irritable, feel very hungry, cough often, get headaches, or have difficulty concentrating. The withdrawal symptoms are only temporary. They are strongest  when you first quit, but they will go away within 10-14 days. To reduce the chances of relapse, try to:  Avoid drinking alcohol. Drinking lowers your chances of successfully quitting.  Reduce the amount of caffeine you consume. Once you quit smoking, the amount of caffeine in your body increases and can give you symptoms, such as a rapid heartbeat, sweating, and anxiety.  Avoid smokers because they can make you want to smoke.  Do not let weight gain distract you. Many smokers will gain weight when they quit, usually less than 10 pounds. Eat a healthy diet and stay active. You can always lose the weight gained after you quit.  Find ways to improve your mood other than smoking. FOR MORE INFORMATION  www.smokefree.gov  Document Released: 04/19/2001 Document Revised: 09/09/2013 Document Reviewed: 08/04/2011 [facility] Patient Information 2015 [place], [place]. This information is not intended to replace advice given to you by your health care provider. Make sure you discuss any questions you have with your health care provider. Urinary Tract Infection Urinary tract infections (UTIs) can develop anywhere along your urinary tract. Your urinary tract is your body's drainage system  for removing wastes and extra water. Your urinary tract includes two kidneys, two ureters, a bladder, and a urethra. Your kidneys are a pair of bean-shaped organs. Each kidney is about the size of your fist. They are located below your ribs, one on each side of your spine. CAUSES Infections are caused by microbes, which are microscopic organisms, including fungi, viruses, and bacteria. These organisms are so small that they can only be seen through a microscope. Bacteria are the microbes that most commonly cause UTIs. SYMPTOMS  Symptoms of UTIs may vary by age and gender of the patient and by the location of the infection. Symptoms in young women typically include a frequent and intense urge to urinate and a painful, burning  feeling in the bladder or urethra during urination. Older women and men are more likely to be tired, shaky, and weak and have muscle aches and abdominal pain. A fever may mean the infection is in your kidneys. Other symptoms of a kidney infection include pain in your back or sides below the ribs, nausea, and vomiting. DIAGNOSIS To diagnose a UTI, your caregiver will ask you about your symptoms. Your caregiver also will ask to provide a urine sample. The urine sample will be tested for bacteria and white blood cells. White blood cells are made by your body to help fight infection. TREATMENT  Typically, UTIs can be treated with medication. Because most UTIs are caused by a bacterial infection, they usually can be treated with the use of antibiotics. The choice of antibiotic and length of treatment depend on your symptoms and the type of bacteria causing your infection. HOME CARE INSTRUCTIONS  If you were prescribed antibiotics, take them exactly as your caregiver instructs you. Finish the medication even if you feel better after you have only taken some of the medication.  Drink enough water and fluids to keep your urine clear or pale yellow.  Avoid caffeine, tea, and carbonated beverages. They tend to irritate your bladder.  Empty your bladder often. Avoid holding urine for long periods of time.  Empty your bladder before and after sexual intercourse.  After a bowel movement, women should cleanse from front to back. Use each tissue only once. SEEK MEDICAL CARE IF:   You have back pain.  You develop a fever.  Your symptoms do not begin to resolve within 3 days. SEEK IMMEDIATE MEDICAL CARE IF:   You have severe back pain or lower abdominal pain.  You develop chills.  You have nausea or vomiting.  You have continued burning or discomfort with urination. MAKE SURE YOU:   Understand these instructions.  Will watch your condition.  Will get help right away if you are not doing well  or get worse. Document Released: 02/02/2005 Document Revised: 10/25/2011 Document Reviewed: 06/03/2011 [facility] Patient Information 2015 [place], [place]. This information is not intended to replace advice given to you by your health care provider. Make sure you discuss any questions you have with your health care provider.

## 2014-03-26 NOTE — Progress Notes (Signed)
Patient ID: Kayla [name], female   DOB: [birth year], 43 y.o.   MRN: [id number]  Chief Complaint  Patient presents with  . Follow-up    HPI Kayla [name] is [name] 43 y.o. female.   Urinary Tract Infection  This is [name] new problem. Quality: crampy. There has been no fever. Pertinent negatives include no hematuria, hesitancy or urgency. Associated symptoms comments: Fullness sensation. She has tried nothing for the symptoms.    Past Medical History  Diagnosis Date  . SVD (spontaneous vaginal delivery)     x 3  . Anemia   . Sleep apnea     lost weight 2013 gastric sleeve, no longer [name] problem    Past Surgical History  Procedure Laterality Date  . Cesarean section      x 1  . Sleeve gastroplasty  2013    in [place]  . Robotic assisted total hysterectomy N/[name] 09/27/2013    Procedure: ROBOTIC ASSISTED TOTAL HYSTERECTOMY;  Surgeon: Lahoma Crocker, MD;  Location: [facility];  Service: Gynecology;  Laterality: N/[name];  . Bilateral salpingectomy Bilateral 09/27/2013    Procedure: BILATERAL SALPINGECTOMY;  Surgeon: Lahoma Crocker, MD;  Location: [facility];  Service: Gynecology;  Laterality: Bilateral;  . Laparotomy Left 09/27/2013    Procedure: EXPLORATORY LAPAROTOMY W/ EXPLORATION OF LEFT ILLIAC ARTERY & VEIN;  Surgeon: Lahoma Crocker, MD;  Location: [facility];  Service: Gynecology;  Laterality: Left;    Family History  Problem Relation Age of Onset  . Hypertension Mother     Social History History  Substance Use Topics  . Smoking status: Current Every Day Smoker -- 1.00 packs/day for 25 years    Types: Cigarettes  . Smokeless tobacco: Never Used  . Alcohol Use: Yes     Comment: socially    Allergies  Allergen Reactions  . Morphine And Related Nausea And Vomiting    Current Outpatient Prescriptions  Medication Sig Dispense Refill  . Multiple Vitamin (MULTIVITAMIN WITH MINERALS) TABS tablet Take 1 tablet by mouth daily.    . vitamin B-12 (CYANOCOBALAMIN) 100 MCG tablet Take 100 mcg by mouth  daily.    . ferrous sulfate 325 (65 FE) MG tablet Take 650 mg by mouth daily with breakfast.    . nicotine (NICODERM CQ - DOSED IN MG/24 HOURS) 21 mg/24hr patch Place 1 patch (21 mg total) onto the skin daily. 28 patch 2  . sulfamethoxazole-trimethoprim (BACTRIM DS,SEPTRA DS) 800-160 MG per tablet Take 1 tablet by mouth 2 (two) times daily. 6 tablet 0   No current facility-administered medications for this visit.    Review of Systems Review of Systems  Genitourinary: Negative for hesitancy, urgency and hematuria.   Constitutional: negative for fatigue and weight loss Respiratory: negative for cough and wheezing Cardiovascular: negative for chest pain, fatigue and palpitations Gastrointestinal: negative for abdominal pain and change in bowel habits Integument/breast: negative for nipple discharge Musculoskeletal:negative for myalgias Neurological: negative for gait problems and tremors Behavioral/Psych: negative for abusive relationship, depression Endocrine: negative for temperature intolerance     Blood pressure 124/90, pulse 83, temperature 98.1 F (36.7 C), weight 86.637 kg (191 lb), last menstrual period 08/02/2013.  Physical Exam Physical Exam General:   alert  Skin:   no rash or abnormalities  Lungs:   clear to auscultation bilaterally  Heart:   regular rate and rhythm, S1, S2 normal, no murmur, click, rub or gallop  Abdomen:  normal findings: no organomegaly, soft, non-tender and no hernia  Pelvis:  External genitalia: normal general appearance Urinary  system: urethral meatus normal and bladder without fullness, nontender Vaginal: normal without tenderness, induration or masses Adnexa: normal bimanual exam       Data Reviewed labs  Assessment    UTI     Plan    Orders Placed This Encounter  Procedures  . Urine culture  . Hemoglobin and hematocrit, blood  . POCT urinalysis dipstick   Meds ordered this encounter  Medications  . vitamin B-12  (CYANOCOBALAMIN) 100 MCG tablet    Sig: Take 100 mcg by mouth daily.  Marland Kitchen sulfamethoxazole-trimethoprim (BACTRIM DS,SEPTRA DS) 800-160 MG per tablet    Sig: Take 1 tablet by mouth 2 (two) times daily.    Dispense:  6 tablet    Refill:  0  . nicotine (NICODERM CQ - DOSED IN MG/24 HOURS) 21 mg/24hr patch    Sig: Place 1 patch (21 mg total) onto the skin daily.    Dispense:  28 patch    Refill:  2    Follow up as needed or in 6 mths        JACKSON-MOORE,Kayla [name] 03/26/2014, 12:09 PM

## 2014-03-27 LAB — HEMOGLOBIN AND HEMATOCRIT, BLOOD
HCT: 40.8 % (ref 36.0–46.0)
Hemoglobin: 12.9 g/dL (ref 12.0–15.0)

## 2014-03-28 LAB — URINE CULTURE

## 2014-05-05 ENCOUNTER — Encounter: Payer: Self-pay | Admitting: *Deleted

## 2014-05-06 ENCOUNTER — Encounter: Payer: Self-pay | Admitting: Obstetrics & Gynecology

## 2014-09-29 ENCOUNTER — Encounter (HOSPITAL_COMMUNITY): Payer: Self-pay | Admitting: Emergency Medicine

## 2014-09-29 ENCOUNTER — Emergency Department (HOSPITAL_COMMUNITY)
Admission: EM | Admit: 2014-09-29 | Discharge: 2014-09-29 | Disposition: A | Discharge status: Home / Self Care | Payer: Self-pay | Source: Home / Self Care | Attending: Family Medicine | Admitting: Family Medicine

## 2014-09-29 DIAGNOSIS — N3001 Acute cystitis with hematuria: Secondary | ICD-10-CM

## 2014-09-29 LAB — POCT URINALYSIS DIP (DEVICE)
GLUCOSE, UA: NEGATIVE mg/dL
NITRITE: POSITIVE — AB
Protein, ur: 30 mg/dL — AB
Specific Gravity, Urine: >=1.03 (ref 1.005–1.030)
UROBILINOGEN UA: 1 mg/dL (ref 0.0–1.0)
pH: 6 (ref 5.0–8.0)

## 2014-09-29 MED ORDER — CEPHALEXIN 500 MG PO CAPS: 500.0000 mg | ORAL_CAPSULE | Freq: Two times a day (BID) | ORAL | Status: DC

## 2014-09-29 NOTE — ED Provider Notes (Signed)
Kayla [name] is a 44 y.o. female who presents to Urgent Care today for urinary frequency urgency and dysuria for 1 week. No fevers chills nausea vomiting or diarrhea. She has tried cranberry juice which helped a little. She feels well otherwise.   Past Medical History  Diagnosis Date  . SVD (spontaneous vaginal delivery)     x 3  . Anemia   . Sleep apnea     lost weight 2013 gastric sleeve, no longer a problem   Past Surgical History  Procedure Laterality Date  . Cesarean section      x 1  . Sleeve gastroplasty  2013    in [place]  . Robotic assisted total hysterectomy N/A 09/27/2013    Procedure: ROBOTIC ASSISTED TOTAL HYSTERECTOMY;  Surgeon: Lahoma Crocker, MD;  Location: [facility];  Service: Gynecology;  Laterality: N/A;  . Bilateral salpingectomy Bilateral 09/27/2013    Procedure: BILATERAL SALPINGECTOMY;  Surgeon: Lahoma Crocker, MD;  Location: [facility];  Service: Gynecology;  Laterality: Bilateral;  . Laparotomy Left 09/27/2013    Procedure: EXPLORATORY LAPAROTOMY W/ EXPLORATION OF LEFT ILLIAC ARTERY & VEIN;  Surgeon: Lahoma Crocker, MD;  Location: [facility];  Service: Gynecology;  Laterality: Left;   History  Substance Use Topics  . Smoking status: Current Every Day Smoker -- 1.00 packs/day for 25 years    Types: Cigarettes  . Smokeless tobacco: Never Used  . Alcohol Use: Yes     Comment: socially   ROS as above Medications: No current facility-administered medications for this encounter.   Current Outpatient Prescriptions  Medication Sig Dispense Refill  . cephALEXin (KEFLEX) 500 MG capsule Take 1 capsule (500 mg total) by mouth 2 (two) times daily. 14 capsule 0  . ferrous sulfate 325 (65 FE) MG tablet Take 650 mg by mouth daily with breakfast.    . Multiple Vitamin (MULTIVITAMIN WITH MINERALS) TABS tablet Take 1 tablet by mouth daily.    . nicotine (NICODERM CQ - DOSED IN MG/24 HOURS) 21 mg/24hr patch Place 1 patch (21 mg total) onto the skin daily. 28 patch 2  .  sulfamethoxazole-trimethoprim (BACTRIM DS,SEPTRA DS) 800-160 MG per tablet Take 1 tablet by mouth 2 (two) times daily. 6 tablet 0  . vitamin B-12 (CYANOCOBALAMIN) 100 MCG tablet Take 100 mcg by mouth daily.     Allergies  Allergen Reactions  . Morphine And Related Nausea And Vomiting     Exam:  BP 129/91 mmHg  Pulse 87  Temp(Src) 97.8 F (36.6 C) (Oral)  Resp 18  SpO2 99%  LMP 08/02/2013 Gen: Well NAD HEENT: EOMI,  MMM Lungs: Normal work of breathing. CTABL Heart: RRR no MRG Abd: NABS, Soft. Nondistended, Nontender no CV angle tenderness to percussion Exts: Brisk capillary refill, warm and well perfused.   Results for orders placed or performed during the hospital encounter of 09/29/14 (from the past 24 hour(s))  POCT urinalysis dip (device)     Status: Abnormal   Collection Time: 09/29/14  8:38 AM  Result Value Ref Range   Glucose, UA NEGATIVE NEGATIVE mg/dL   Bilirubin Urine SMALL (A) NEGATIVE   Ketones, ur TRACE (A) NEGATIVE mg/dL   Specific Gravity, Urine >=1.030 1.005 - 1.030   Hgb urine dipstick MODERATE (A) NEGATIVE   pH 6.0 5.0 - 8.0   Protein, ur 30 (A) NEGATIVE mg/dL   Urobilinogen, UA 1.0 0.0 - 1.0 mg/dL   Nitrite POSITIVE (A) NEGATIVE   Leukocytes, UA LARGE (A) NEGATIVE   No results found.  Assessment  and Plan: 44 y.o. female with urinary tract infection. Culture pending. Empiric treatment with Keflex.  Discussed warning signs or symptoms. Please see discharge instructions. Patient expresses understanding.     Gregor Hams, MD 09/29/14 [phone]

## 2014-09-29 NOTE — Discharge Instructions (Signed)
Thank you for coming in today. If your belly pain worsens, or you have high fever, bad vomiting, blood in your stool or black tarry stool go to the Emergency Room.   Urinary Tract Infection Urinary tract infections (UTIs) can develop anywhere along your urinary tract. Your urinary tract is your body's drainage system for removing wastes and extra water. Your urinary tract includes two kidneys, two ureters, a bladder, and a urethra. Your kidneys are a pair of bean-shaped organs. Each kidney is about the size of your fist. They are located below your ribs, one on each side of your spine. CAUSES Infections are caused by microbes, which are microscopic organisms, including fungi, viruses, and bacteria. These organisms are so small that they can only be seen through a microscope. Bacteria are the microbes that most commonly cause UTIs. SYMPTOMS  Symptoms of UTIs may vary by age and gender of the patient and by the location of the infection. Symptoms in young women typically include a frequent and intense urge to urinate and a painful, burning feeling in the bladder or urethra during urination. Older women and men are more likely to be tired, shaky, and weak and have muscle aches and abdominal pain. A fever may mean the infection is in your kidneys. Other symptoms of a kidney infection include pain in your back or sides below the ribs, nausea, and vomiting. DIAGNOSIS To diagnose a UTI, your caregiver will ask you about your symptoms. Your caregiver also will ask to provide a urine sample. The urine sample will be tested for bacteria and white blood cells. White blood cells are made by your body to help fight infection. TREATMENT  Typically, UTIs can be treated with medication. Because most UTIs are caused by a bacterial infection, they usually can be treated with the use of antibiotics. The choice of antibiotic and length of treatment depend on your symptoms and the type of bacteria causing your  infection. HOME CARE INSTRUCTIONS  If you were prescribed antibiotics, take them exactly as your caregiver instructs you. Finish the medication even if you feel better after you have only taken some of the medication.  Drink enough water and fluids to keep your urine clear or pale yellow.  Avoid caffeine, tea, and carbonated beverages. They tend to irritate your bladder.  Empty your bladder often. Avoid holding urine for long periods of time.  Empty your bladder before and after sexual intercourse.  After a bowel movement, women should cleanse from front to back. Use each tissue only once. SEEK MEDICAL CARE IF:   You have back pain.  You develop a fever.  Your symptoms do not begin to resolve within 3 days. SEEK IMMEDIATE MEDICAL CARE IF:   You have severe back pain or lower abdominal pain.  You develop chills.  You have nausea or vomiting.  You have continued burning or discomfort with urination. MAKE SURE YOU:   Understand these instructions.  Will watch your condition.  Will get help right away if you are not doing well or get worse. Document Released: 02/02/2005 Document Revised: 10/25/2011 Document Reviewed: 06/03/2011 [employer] Patient Information 2015 [employer]. This information is not intended to replace advice given to you by your health care provider. Make sure you discuss any questions you have with your health care provider.  

## 2014-09-29 NOTE — ED Notes (Signed)
Pt states that she has had urinary frequency with urine odor for over a week.

## 2014-10-01 LAB — URINE CULTURE: Special Requests: NORMAL

## 2014-10-13 NOTE — ED Notes (Signed)
Final report of UA culture shows organism sensitive to Rx written, treatment adequate . No further action reqwuired

## 2014-10-28 ENCOUNTER — Telehealth: Payer: Self-pay | Admitting: Internal Medicine

## 2014-10-28 NOTE — Telephone Encounter (Signed)
Patient called this a.m. to confirm a fasting lab appointment and PE appointment.  Advised that she doesn't have an appointment with Dr. Renold Genta.   Upon scheduling an appointment for the patient, I saw that patient had also scheduled to establish with [facility] and had appointment scheduled with Dr. Maudie Mercury for 7/1.  Cannot be established at 2 different primary care practices.  So, I did not continue with the appointment.    Explained to patient during the call that she was trying to establish with 2 practices and therefore, since she already had an appointment with Dr. Maudie Mercury at [facility], she didn't have an appointment here.  Patient said she didn't know she had an appointment at [facility].  I advised that she had made an appointment and paperwork had been mailed to her according to the appointment notes in the computer.  I further advised that currently it would be the end of July before we could get her in to see Dr. Renold Genta if she was a candidate for our practice.  Patient advised that she could not wait that long because she had already waited until July 1 to be seen.  Patient said she would be seen by Dr. Maudie Mercury and then try to transfer to Dr. Renold Genta.  I advised against establishing in one practice just to move to another.    Patient was loud on the phone and clearly upset with the information that I provided.  Spoke with Dr. Renold Genta to advise and she agrees that we don't accept patients into 2 PCP practices at the same time.  Patient is not a patient of Dr. Verlene Mayer.  Dr. Renold Genta advised she will not be seeing the patient since she has been accepted by [facility].  Sending this information to our Practice Administrator at the request of Dr. Renold Genta.

## 2014-11-04 ENCOUNTER — Other Ambulatory Visit: Payer: 59 | Admitting: Internal Medicine

## 2014-11-07 ENCOUNTER — Other Ambulatory Visit: Payer: Self-pay | Admitting: Family Medicine

## 2014-11-07 ENCOUNTER — Ambulatory Visit (INDEPENDENT_AMBULATORY_CARE_PROVIDER_SITE_OTHER): Payer: 59 | Admitting: Family Medicine

## 2014-11-07 ENCOUNTER — Encounter: Payer: Self-pay | Admitting: Family Medicine

## 2014-11-07 ENCOUNTER — Other Ambulatory Visit: Payer: Self-pay

## 2014-11-07 VITALS — BP 118/82 | HR 89 | Temp 98.1°F | Ht 64.25 in | Wt 198.7 lb

## 2014-11-07 DIAGNOSIS — Z7189 Other specified counseling: Secondary | ICD-10-CM | POA: Diagnosis not present

## 2014-11-07 DIAGNOSIS — E669 Obesity, unspecified: Secondary | ICD-10-CM

## 2014-11-07 DIAGNOSIS — Z720 Tobacco use: Secondary | ICD-10-CM

## 2014-11-07 DIAGNOSIS — Z7689 Persons encountering health services in other specified circumstances: Secondary | ICD-10-CM

## 2014-11-07 DIAGNOSIS — F17200 Nicotine dependence, unspecified, uncomplicated: Secondary | ICD-10-CM

## 2014-11-07 DIAGNOSIS — Z1231 Encounter for screening mammogram for malignant neoplasm of breast: Secondary | ICD-10-CM

## 2014-11-07 MED ORDER — BUPROPION HCL ER (SR) 150 MG PO TB12: 150.0000 mg | ORAL_TABLET | Freq: Every day | ORAL | Status: DC

## 2014-11-07 NOTE — Progress Notes (Addendum)
HPI:  Kayla [name] is here to establish care. Moved here 3 years ago.  Last PCP and physical: sees ob/gyn and is s/p hysterectomy in 2015 for fibroids  Has the following chronic problems that require follow up and concerns today:  Tobacco use: -smokes 1ppd, 25 pack years -reports ready to quit, good reason to quit - 46 yo wants her to quit -discussed options for quitting  Obesity: -s/p gastric sleeve 2013 -10,000 step per day -diet is poor - difficult due to her job  She reports she will call to set up her mammogram.  ROS negative for unless reported above: fevers, unintentional weight loss, hearing or vision loss, chest pain, palpitations, struggling to breath, hemoptysis, melena, hematochezia, hematuria, falls, loc, si, thoughts of self harm  Past Medical History  Diagnosis Date  . SVD (spontaneous vaginal delivery)     x 3  . Anemia   . Sleep apnea     lost weight 2013 gastric sleeve, no longer a problem  . Fatigue   . Tobacco use   . Obesity     Past Surgical History  Procedure Laterality Date  . Cesarean section      x 1  . Sleeve gastroplasty  2013    in [place]  . Robotic assisted total hysterectomy N/A 09/27/2013    Procedure: ROBOTIC ASSISTED TOTAL HYSTERECTOMY;  Surgeon: Lahoma Crocker, MD;  Location: [facility];  Service: Gynecology;  Laterality: N/A;  . Bilateral salpingectomy Bilateral 09/27/2013    Procedure: BILATERAL SALPINGECTOMY;  Surgeon: Lahoma Crocker, MD;  Location: [facility];  Service: Gynecology;  Laterality: Bilateral;  . Laparotomy Left 09/27/2013    Procedure: EXPLORATORY LAPAROTOMY W/ EXPLORATION OF LEFT ILLIAC ARTERY & VEIN;  Surgeon: Lahoma Crocker, MD;  Location: [facility];  Service: Gynecology;  Laterality: Left;    Family History  Problem Relation Age of Onset  . Hypertension Mother   . Colon cancer Maternal Grandfather   . Colon cancer Maternal Aunt   . Stomach cancer Maternal Aunt   . Leukemia Paternal Grandmother     History    Social History  . Marital Status: Married    Spouse Name: N/A  . Number of Children: N/A  . Years of Education: N/A   Social History Main Topics  . Smoking status: Current Every Day Smoker -- 1.00 packs/day for 25 years    Types: Cigarettes  . Smokeless tobacco: Never Used  . Alcohol Use: Yes     Comment: socially  . Drug Use: No  . Sexual Activity:    Partners: Male    Birth Control/ Protection: None   Other Topics Concern  . None   Social History Narrative   Work or School: Producer, television/film/video, Armed forces technical officer Situation: lives with husband and 63 yo sone in 2016      Spiritual Beliefs:       Lifestyle: no regular exercise, poor diet           Current outpatient prescriptions:  .  BIOTIN PO, Take by mouth., Disp: , Rfl:  .  Multiple Vitamin (MULTIVITAMIN WITH MINERALS) TABS tablet, Take 1 tablet by mouth daily., Disp: , Rfl:  .  vitamin B-12 (CYANOCOBALAMIN) 100 MCG tablet, Take 100 mcg by mouth daily., Disp: , Rfl:  .  buPROPion (WELLBUTRIN SR) 150 MG 12 hr tablet, Take 1 tablet (150 mg total) by mouth daily., Disp: 90 tablet, Rfl: 0  EXAM:  Filed Vitals:   11/07/14 1115  BP:  118/82  Pulse: 89  Temp: 98.1 F (36.7 C)    Body mass index is 33.84 kg/(m^2).  GENERAL: vitals reviewed and listed above, alert, oriented, appears well hydrated and in no acute distress  HEENT: atraumatic, conjunttiva clear, no obvious abnormalities on inspection of external nose and ears  NECK: no obvious masses on inspection  LUNGS: clear to auscultation bilaterally, no wheezes, rales or rhonchi, good air movement  CV: HRRR, no peripheral edema  MS: moves all extremities without noticeable abnormality  PSYCH: pleasant and cooperative, no obvious depression or anxiety  ASSESSMENT AND PLAN:  Discussed the following assessment and plan: NOTE: > 30 minutes spent face to face with this patient with > 50% of the time spent on counseling  Tobacco use disorder - Plan:  buPROPion (WELLBUTRIN SR) 150 MG 12 hr tablet -advised to quit, offered help -she opted to try wellbutrin after discussion of risks and benefits ->3<10 minutes spent on counseling on this topic  Obesity -lifestyle recs -discussed diet and exercise at length  Encounter to establish care -We reviewed the PMH, PSH, FH, SH, Meds and Allergies. -We provided refills for any medications we will prescribe as needed. -We addressed current concerns per orders and patient instructions. -We have asked for records for pertinent exams, studies, vaccines and notes from previous providers. -We have advised patient to follow up per instructions below.   -Patient advised to return or notify a doctor immediately if symptoms worsen or persist or new concerns arise.  Patient Instructions  BEFORE YOU LEAVE: -schedule physical exam in 1-3 months; plenty of water but come fasting  We recommend the following healthy lifestyle measures: - eat a healthy diet consisting of lots of vegetables, fruits, beans, nuts, seeds, healthy meats such as white chicken and fish and whole grains.  - avoid fried foods, fast food, processed foods, sodas, red meet and other fattening foods.  - get a least 150 minutes of aerobic exercise per week.   Please start the Wellbutrin today and quit smoking in 1 week. You CAN do this!  Schedule your mammogram     [name].

## 2014-11-07 NOTE — Patient Instructions (Addendum)
BEFORE YOU LEAVE: -schedule physical exam in 1-3 months; plenty of water but come fasting  We recommend the following healthy lifestyle measures: - eat a healthy diet consisting of lots of vegetables, fruits, beans, nuts, seeds, healthy meats such as white chicken and fish and whole grains.  - avoid fried foods, fast food, processed foods, sodas, red meet and other fattening foods.  - get a least 150 minutes of aerobic exercise per week.   Please start the Wellbutrin today and quit smoking in 1 week. You CAN do this!  Schedule your mammogram

## 2014-11-07 NOTE — Progress Notes (Signed)
Pre visit review using our clinic review tool, if applicable. No additional management support is needed unless otherwise documented below in the visit note. 

## 2014-11-13 ENCOUNTER — Ambulatory Visit: Payer: Self-pay

## 2014-11-27 ENCOUNTER — Ambulatory Visit (INDEPENDENT_AMBULATORY_CARE_PROVIDER_SITE_OTHER): Payer: 59 | Admitting: Family Medicine

## 2014-11-27 ENCOUNTER — Encounter: Payer: Self-pay | Admitting: Family Medicine

## 2014-11-27 VITALS — BP 104/80 | HR 93 | Temp 98.1°F | Ht 65.0 in | Wt 193.0 lb

## 2014-11-27 DIAGNOSIS — E669 Obesity, unspecified: Secondary | ICD-10-CM | POA: Diagnosis not present

## 2014-11-27 DIAGNOSIS — Z720 Tobacco use: Secondary | ICD-10-CM

## 2014-11-27 DIAGNOSIS — Z0000 Encounter for general adult medical examination without abnormal findings: Secondary | ICD-10-CM | POA: Diagnosis not present

## 2014-11-27 DIAGNOSIS — F17200 Nicotine dependence, unspecified, uncomplicated: Secondary | ICD-10-CM

## 2014-11-27 DIAGNOSIS — Z1211 Encounter for screening for malignant neoplasm of colon: Secondary | ICD-10-CM | POA: Diagnosis not present

## 2014-11-27 LAB — LIPID PANEL
CHOL/HDL RATIO: 2
CHOLESTEROL: 142 mg/dL (ref 0–200)
HDL: 68.1 mg/dL (ref >39.00)
LDL Cholesterol: 66 mg/dL (ref 0–99)
NonHDL: 73.9
TRIGLYCERIDES: 42 mg/dL (ref 0.0–149.0)
VLDL: 8.4 mg/dL (ref 0.0–40.0)

## 2014-11-27 LAB — HEMOGLOBIN A1C: HEMOGLOBIN A1C: 5.4 % (ref 4.6–6.5)

## 2014-11-27 NOTE — Progress Notes (Signed)
Pre visit review using our clinic review tool, if applicable. No additional management support is needed unless otherwise documented below in the visit note. 

## 2014-11-27 NOTE — Progress Notes (Addendum)
HPI:  Here for CPE:  -Concerns and/or follow up today:  Tobacco use: -smokes 1ppd, 25 pack years -reported ready to quit 11/07/2014 and trial wellbutrin started, good reason to quit - 46 yo wants her to quit -discussed options for quitting -started wellbutrin and had stopped smoking, but sister in law got stabbed and she restarted smoking, but is doing ok and plans to quit again  Obesity: -s/p gastric sleeve 2013 -10,000 step per day -diet is poor - difficult due to her job  -Diet: variety of foods, balance and well rounded  -Exercise: no regular exercise  -Taking folic acid, vitamin D or calcium: no  -Diabetes and Dyslipidemia Screening: FASTING  -Hx of HTN: no  -Vaccines: UTD  -pap history: n/a s/p hysterectomy and oophorectomy for fibroids, sees gyn -   -FDLMP: n/a  -sexual activity: yes, female partner, no new partners  -wants STI testing (Hep C if born [birth year]): no  -FH breast, colon or ovarian ca: see FH Last mammogram: schedule for mammogram Last colon cancer screening: n/a  Breast Ca Risk Assessment: -no sig personal or FH  -Alcohol, Tobacco, drug use: see social history  Review of Systems - no fevers, unintentional weight loss, vision loss, hearing loss, chest pain, sob, hemoptysis, melena, hematochezia, hematuria, genital discharge, changing or concerning skin lesions, bleeding, bruising, loc, thoughts of self harm or SI  Past Medical History  Diagnosis Date  . SVD (spontaneous vaginal delivery)     x 3  . Anemia   . Sleep apnea     lost weight 2013 gastric sleeve, no longer a problem  . Fatigue   . Tobacco use   . Obesity     Past Surgical History  Procedure Laterality Date  . Cesarean section      x 1  . Sleeve gastroplasty  2013    in [place]  . Robotic assisted total hysterectomy N/A 09/27/2013    Procedure: ROBOTIC ASSISTED TOTAL HYSTERECTOMY;  Surgeon: Lahoma Crocker, MD;  Location: [facility];  Service: Gynecology;  Laterality: N/A;  .  Bilateral salpingectomy Bilateral 09/27/2013    Procedure: BILATERAL SALPINGECTOMY;  Surgeon: Lahoma Crocker, MD;  Location: [facility];  Service: Gynecology;  Laterality: Bilateral;  . Laparotomy Left 09/27/2013    Procedure: EXPLORATORY LAPAROTOMY W/ EXPLORATION OF LEFT ILLIAC ARTERY & VEIN;  Surgeon: Lahoma Crocker, MD;  Location: [facility];  Service: Gynecology;  Laterality: Left;    Family History  Problem Relation Age of Onset  . Hypertension Mother   . Colon cancer Maternal Grandfather   . Colon cancer Maternal Aunt   . Stomach cancer Maternal Aunt   . Leukemia Paternal Grandmother     History   Social History  . Marital Status: Married    Spouse Name: N/A  . Number of Children: N/A  . Years of Education: N/A   Social History Main Topics  . Smoking status: Current Every Day Smoker -- 1.00 packs/day for 25 years    Types: Cigarettes  . Smokeless tobacco: Never Used  . Alcohol Use: Yes     Comment: socially  . Drug Use: No  . Sexual Activity:    Partners: Male    Birth Control/ Protection: None   Other Topics Concern  . None   Social History Narrative   Work or School: Producer, television/film/video, Catering manager      Home Situation: lives with husband and 55 yo [place] in 2016      Spiritual Beliefs: Christian      Lifestyle: no  regular exercise, poor diet           Current outpatient prescriptions:  .  BIOTIN PO, Take by mouth., Disp: , Rfl:  .  buPROPion (WELLBUTRIN SR) 150 MG 12 hr tablet, Take 1 tablet (150 mg total) by mouth daily., Disp: 90 tablet, Rfl: 0 .  Multiple Vitamin (MULTIVITAMIN WITH MINERALS) TABS tablet, Take 1 tablet by mouth daily., Disp: , Rfl:  .  vitamin B-12 (CYANOCOBALAMIN) 100 MCG tablet, Take 100 mcg by mouth daily., Disp: , Rfl:   EXAM:  Filed Vitals:   11/27/14 1253  BP: 104/80  Pulse: 93  Temp: 98.1 F (36.7 C)    GENERAL: vitals reviewed and listed below, alert, oriented, appears well hydrated and in no acute distress  HEENT: head  atraumatic, PERRLA, normal appearance of eyes, ears, nose and mouth. moist mucus membranes.  NECK: supple, no masses or lymphadenopathy  LUNGS: clear to auscultation bilaterally, no rales, rhonchi or wheeze  CV: HRRR, no peripheral edema or cyanosis, normal pedal pulses  BREAST: declined  ABDOMEN: bowel sounds normal, soft, non tender to palpation, no masses, no rebound or guarding  GU: declined  RECTAL: refused  SKIN: no rash or abnormal lesions  MS: normal gait, moves all extremities normally  NEURO: CN II-XII grossly intact, normal muscle strength and sensation to light touch on extremities  PSYCH: normal affect, pleasant and cooperative  ASSESSMENT AND PLAN:  Discussed the following assessment and plan:  There are no diagnoses linked to this encounter.  Visit for preventive health examination - Plan: Lipid panel, Hemoglobin A1C, HIV antibody (with reflex)  Obesity - Plan: Lipid panel, Hemoglobin A1C  Tobacco use disorder  -smoking cessation counseling provided for >3<10 minutes, discussed motivation to quit, advised to wuit an offered help, discussed pharmacological options and information for help provided.  -Discussed and advised all [place] preventive services health task force level A and B recommendations for age, sex and risks.  -Advised at least 150 minutes of exercise per week and a healthy diet low in saturated fats and sweets and consisting of fresh fruits and vegetables, lean meats such as fish and white chicken and whole grains.  -FASTING labs, studies and vaccines per orders this encounter  Orders Placed This Encounter  Procedures  . Lipid panel  . Hemoglobin A1C  . HIV antibody (with reflex)    Patient advised to return to clinic immediately if symptoms worsen or persist or new concerns.  Patient Instructions  BEFORE YOU LEAVE: -labs -follow up in 3-4 months about the smoking  We recommend the following healthy lifestyle measures: - eat a  healthy diet consisting of lots of vegetables, fruits, beans, nuts, seeds, healthy meats such as white chicken and fish and whole grains.  - avoid fried foods, fast food, processed foods, sodas, red meet and other fattening foods.  - get a least 150 minutes of aerobic exercise per week.      No Follow-up on file.  Colin Benton R.

## 2014-11-27 NOTE — Patient Instructions (Signed)
BEFORE YOU LEAVE: -labs -follow up in 3-4 months about the smoking  We recommend the following healthy lifestyle measures: - eat a healthy diet consisting of lots of vegetables, fruits, beans, nuts, seeds, healthy meats such as white chicken and fish and whole grains.  - avoid fried foods, fast food, processed foods, sodas, red meet and other fattening foods.  - get a least 150 minutes of aerobic exercise per week.

## 2014-11-28 LAB — HIV ANTIBODY (ROUTINE TESTING W REFLEX): HIV 1&2 Ab, 4th Generation: NONREACTIVE

## 2014-12-01 NOTE — Progress Notes (Signed)
Ok to refer to GI for further discussion of her colon ca risks and preventive strategies if she wishes.

## 2014-12-02 ENCOUNTER — Encounter: Payer: Self-pay | Admitting: Internal Medicine

## 2014-12-02 NOTE — Addendum Note (Signed)
Addended by: [name] on: 12/02/2014 02:46 PM   Modules accepted: Level of Service

## 2014-12-02 NOTE — Addendum Note (Signed)
Addended by: Agnes Lawrence on: 12/02/2014 08:33 AM   Modules accepted: Orders

## 2014-12-02 NOTE — Progress Notes (Signed)
I called the pt and she does want to see a GI and she is aware the referral was entered and someone will call her with appt info.

## 2014-12-17 ENCOUNTER — Ambulatory Visit
Admission: RE | Admit: 2014-12-17 | Discharge: 2014-12-17 | Disposition: A | Discharge status: Home / Self Care | Payer: 59 | Source: Ambulatory Visit

## 2014-12-17 DIAGNOSIS — Z1231 Encounter for screening mammogram for malignant neoplasm of breast: Secondary | ICD-10-CM

## 2015-02-10 ENCOUNTER — Encounter: Payer: Self-pay | Admitting: Internal Medicine

## 2015-02-10 ENCOUNTER — Ambulatory Visit (INDEPENDENT_AMBULATORY_CARE_PROVIDER_SITE_OTHER): Payer: 59 | Admitting: Internal Medicine

## 2015-02-10 VITALS — BP 122/80 | HR 84 | Ht 64.0 in | Wt 199.2 lb

## 2015-02-10 DIAGNOSIS — Z1211 Encounter for screening for malignant neoplasm of colon: Secondary | ICD-10-CM

## 2015-02-10 DIAGNOSIS — Z720 Tobacco use: Secondary | ICD-10-CM | POA: Diagnosis not present

## 2015-02-10 DIAGNOSIS — K5909 Other constipation: Secondary | ICD-10-CM

## 2015-02-10 DIAGNOSIS — K5900 Constipation, unspecified: Secondary | ICD-10-CM

## 2015-02-10 DIAGNOSIS — Z800 Family history of malignant neoplasm of digestive organs: Secondary | ICD-10-CM

## 2015-02-10 DIAGNOSIS — F17200 Nicotine dependence, unspecified, uncomplicated: Secondary | ICD-10-CM

## 2015-02-10 NOTE — Progress Notes (Signed)
  Referred by: [name], DO  Subjective:    Patient ID: Kayla [name], female    DOB: September 03, 1970, 44 y.o.   MRN: [id number] Chief complaint: Family history of colon cancer needs screening and chronic constipation HPI The patient is a pleasant middle-aged married African-American woman with a history of chronic constipation and a family history of colon polyps and cancer. She says in general she moved her bowels about every 3 days and there is some significant straining. This is a chronic bowel habit issue. She is currently using a weight loss pill that contains caffeine and says it is making her somewhat jittery, but she is moving her bowels a bit better with that. She intends to stop this. In the past she says she has tried MiraLAX but not on a chronic daily regimen. It did not help when taken intermittently. There is no rectal bleeding reported. She has a history of a gastric sleeve surgery about 8 or 9 years ago in [place], which resulted in significant weight loss though she has gained some back she is still 100 pounds or more less than she was prior to her bariatric surgery.  Medications, allergies, past medical history, past surgical history, family history and social history are reviewed and updated in the EMR. 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  Review of Systems Chronic back pain and muscle cramps some insomnia and fatigue. All other review of systems negative.    Objective:   Physical Exam @BP  122/80 mmHg  Pulse 84  Ht 5\' 4"  (1.626 m)  Wt 199 lb 4 oz (90.379 kg)  BMI 34.18 kg/m2  LMP 08/02/2013@  General:  Well-developed, well-nourished and in no acute distress Eyes:  anicteric. Lungs: Clear to auscultation bilaterally. Heart:  S1S2, no rubs, murmurs,  gallops. Abdomen:  soft, non-tender, no hepatosplenomegaly, hernia, or mass and BS+. There are well-healed surgical scars Rectal: Deferred until colonoscopy Psych:  appropriate mood and  Affect.   Data Reviewed:  12/01/2014 primary care note and labs in the computer from that visit.      Assessment & Plan:   1. Chronic constipation   2. Family history of colon cancer   3. Colon cancer screening   4.      Smoker  For her chronic constipation I have recommended she take MiraLAX on a daily basis. If that fails to work she may need to increase the dose.  I intend to do a rectal exam and check function of the anorectum prior to colonoscopy.  Screening colonoscopy will be performed.The risks and benefits as well as alternatives of endoscopic procedure(s) have been discussed and reviewed. All questions answered. The patient agrees to proceed.  She has been advised to stop smoking and given a phone number for the stop smoking services  I appreciate the opportunity to care for this patient. CC: [name]., DO

## 2015-02-10 NOTE — Patient Instructions (Addendum)
You have been given a separate informational sheet regarding your tobacco use, the importance of quitting and local resources to help you quit.   You have been scheduled for a colonoscopy. Please follow written instructions given to you at your visit today.  Please pick up your over the counter prep supplies at the pharmacy. If you use inhalers (even only as needed), please bring them with you on the day of your procedure. Your physician has requested that you go to www.startemmi.com and enter the access code given to you at your visit today. This web site gives a general overview about your procedure. However, you should still follow specific instructions given to you by our office regarding your preparation for the procedure.  Take miralax daily.  I appreciate the opportunity to care for you. Silvano Rusk, MD, [facility]

## 2015-02-27 ENCOUNTER — Ambulatory Visit: Payer: 59 | Admitting: Family Medicine

## 2015-04-14 ENCOUNTER — Encounter: Payer: Self-pay | Admitting: Internal Medicine

## 2015-04-14 ENCOUNTER — Ambulatory Visit (AMBULATORY_SURGERY_CENTER): Payer: 59 | Admitting: Internal Medicine

## 2015-04-14 VITALS — BP 99/66 | HR 88 | Temp 98.5°F | Resp 10 | Ht 64.0 in | Wt 199.0 lb

## 2015-04-14 DIAGNOSIS — Z1211 Encounter for screening for malignant neoplasm of colon: Secondary | ICD-10-CM

## 2015-04-14 DIAGNOSIS — Z800 Family history of malignant neoplasm of digestive organs: Secondary | ICD-10-CM

## 2015-04-14 MED ORDER — SODIUM CHLORIDE 0.9 % IV SOLN: 500.0000 mL | INTRAVENOUS | Status: DC

## 2015-04-14 NOTE — Patient Instructions (Addendum)
The colonoscopy was normal. Your next routine colonoscopy should be in 5 years - 2021-22.  Keep trying to use the MiraLax to help your constipation.  I appreciate the opportunity to care for you. Gatha Mayer, MD, FACG  YOU HAD AN ENDOSCOPIC PROCEDURE TODAY AT [facility]:   Refer to the procedure report that was given to you for any specific questions about what was found during the examination.  If the procedure report does not answer your questions, please call your gastroenterologist to clarify.  If you requested that your care partner not be given the details of your procedure findings, then the procedure report has been included in a sealed envelope for you to review at your convenience later.  YOU SHOULD EXPECT: Some feelings of bloating in the abdomen. Passage of more gas than usual.  Walking can help get rid of the air that was put into your GI tract during the procedure and reduce the bloating. If you had a lower endoscopy (such as a colonoscopy or flexible sigmoidoscopy) you may notice spotting of blood in your stool or on the toilet paper. If you underwent a bowel prep for your procedure, you may not have a normal bowel movement for a few days.  Please Note:  You might notice some irritation and congestion in your nose or some drainage.  This is from the oxygen used during your procedure.  There is no need for concern and it should clear up in a day or so.  SYMPTOMS TO REPORT IMMEDIATELY:   Following lower endoscopy (colonoscopy or flexible sigmoidoscopy):  Excessive amounts of blood in the stool  Significant tenderness or worsening of abdominal pains  Swelling of the abdomen that is new, acute  Fever of 100F or higher   For urgent or emergent issues, a gastroenterologist can be reached at any hour by calling [phone].   DIET: Your first meal following the procedure should be a small meal and then it is ok to progress to your normal diet.  Heavy or fried foods are harder to digest and may make you feel nauseous or bloated.  Likewise, meals heavy in dairy and vegetables can increase bloating.  Drink plenty of fluids but you should avoid alcoholic beverages for 24 hours.  ACTIVITY:  You should plan to take it easy for the rest of today and you should NOT DRIVE or use heavy machinery until tomorrow (because of the sedation medicines used during the test).    FOLLOW UP: Our staff will call the number listed on your records the next business day following your procedure to check on you and address any questions or concerns that you may have regarding the information given to you following your procedure. If we do not reach you, we will leave a message.  However, if you are feeling well and you are not experiencing any problems, there is no need to return our call.  We will assume that you have returned to your regular daily activities without incident.  If any biopsies were taken you will be contacted by phone or by letter within the next 1-3 weeks.  Please call us at [phone] if you have not heard about the biopsies in 3 weeks.    SIGNATURES/CONFIDENTIALITY: You and/or your care partner have signed paperwork which will be entered into your electronic medical record.  These signatures attest to the fact that that the information above on your After Visit Summary has been reviewed and is  understood.  Full responsibility of the confidentiality of this discharge information lies with you and/or your care-partner. 

## 2015-04-14 NOTE — Op Note (Signed)
[place]  [address] [place], [id number]   COLONOSCOPY PROCEDURE REPORT  PATIENT: Kayla [name], Kayla [name]  MR#: [id number] BIRTHDATE: [birth year] , 71  yrs. old GENDER: female ENDOSCOPIST: Gatha Mayer, MD, [facility] PROCEDURE DATE:  04/14/2015 PROCEDURE:   Colonoscopy, screening First Screening Colonoscopy - Avg.  risk and is 50 yrs.  old or older Yes.  Prior Negative Screening - Now for repeat screening. N/A  History of Adenoma - Now for follow-up colonoscopy & has been > or = to 3 yrs.  N/A  Polyps removed today? No Recommend repeat exam, <10 yrs? Yes high risk ASA CLASS:   Class III INDICATIONS:Screening for colonic neoplasia, FH Colon or Rectal Adenocarcinoma, and FH Colon Adenoma. MEDICATIONS: Propofol 250 mg IV and Monitored anesthesia care  DESCRIPTION OF PROCEDURE:   After the risks benefits and alternatives of the procedure were thoroughly explained, informed consent was obtained.  The digital rectal exam revealed no abnormalities of the rectum.   The [id number]  endoscope was introduced through the anus and advanced to the cecum, which was identified by both the appendix and ileocecal valve. No adverse events experienced.   The quality of the prep was excellent. (MiraLax was used)  The instrument was then slowly withdrawn as the colon was fully examined. Estimated blood loss is zero unless otherwise noted in this procedure report.      COLON FINDINGS: A normal appearing cecum, ileocecal valve, and appendiceal orifice were identified.  The ascending, transverse, descending, sigmoid colon, and rectum appeared unremarkable. Retroflexed views revealed no abnormalities. The time to cecum = 5.0 Withdrawal time = 10.4   The scope was withdrawn and the procedure completed. COMPLICATIONS: There were no immediate complications.  ENDOSCOPIC IMPRESSION: Normal colonoscopy - excellent prep  RECOMMENDATIONS: Repeat Colonoscopy in 5 years.  Two second  degree relatives with colon cancer, one with polyps and mother with polyps - all maternal sde  eSigned:  Gatha Mayer, MD, [facility] 04/14/2015 8:08 AM   cc: Dr. Colin Benton and The Patient

## 2015-04-14 NOTE — Progress Notes (Signed)
Patient awake and alert, vss, pleased with MAC

## 2015-04-15 ENCOUNTER — Telehealth: Payer: Self-pay

## 2015-04-15 NOTE — Telephone Encounter (Signed)
  Follow up Call-  Call back number 04/14/2015  Post procedure Call Back phone  # [phone]  Permission to leave phone message Yes     Patient questions:  Do you have a fever, pain , or abdominal swelling? No. Pain Score  0 *  Have you tolerated food without any problems? Yes.    Have you been able to return to your normal activities? Yes.    Do you have any questions about your discharge instructions: Diet   No. Medications  No. Follow up visit  No.  Do you have questions or concerns about your Care? No.  Actions: * If pain score is 4 or above: No action needed, pain <4.

## 2015-05-15 ENCOUNTER — Encounter: Payer: Self-pay | Admitting: Family Medicine

## 2015-05-15 ENCOUNTER — Ambulatory Visit (INDEPENDENT_AMBULATORY_CARE_PROVIDER_SITE_OTHER): Payer: 59 | Admitting: Family Medicine

## 2015-05-15 VITALS — BP 120/88 | HR 85 | Temp 98.4°F | Ht 64.0 in | Wt 205.2 lb

## 2015-05-15 DIAGNOSIS — M25559 Pain in unspecified hip: Secondary | ICD-10-CM | POA: Diagnosis not present

## 2015-05-15 DIAGNOSIS — R300 Dysuria: Secondary | ICD-10-CM | POA: Diagnosis not present

## 2015-05-15 LAB — POCT URINALYSIS DIPSTICK
BILIRUBIN UA: NEGATIVE
Glucose, UA: NEGATIVE
KETONES UA: NEGATIVE
Leukocytes, UA: NEGATIVE
NITRITE UA: NEGATIVE
Protein, UA: NEGATIVE
Spec Grav, UA: >=1.03
UROBILINOGEN UA: 0.2
pH, UA: 6

## 2015-05-15 LAB — URINALYSIS, MICROSCOPIC ONLY

## 2015-05-15 NOTE — Patient Instructions (Signed)
BEFORE YOU LEAVE: -schedule follow up for the hip pain in 1 month -IT band exercises  We are doing further studies on the urine to see if there is an infection.  It can take up to 1-2 weeks for results and processing. We will contact you with instructions IF your results are abnormal. Normal results will be released to your [facility]. If you have not heard from [place] or can not find your results in [facility] in 2 weeks please contact our office.  Do the exercises for the hip 3-4 days per week  Avoid sitting or sleeping with legs crossed  Aleve or tylenol for pain per instructions as needed

## 2015-05-15 NOTE — Progress Notes (Signed)
Pre visit review using our clinic review tool, if applicable. No additional management support is needed unless otherwise documented below in the visit note. 

## 2015-05-15 NOTE — Progress Notes (Signed)
HPI:   Acute visit for 2 issues:  1) Dysuria: -for last several days -denies: fevers, chills, flank pain, nv -hx remote hysterectomy  2) bilateral hip pain: -for 1-2 months -R> L -lateral hip TTP -denies trauma, malaise, fevers, weakness, numbness, radiation -worse with pressure on hip and lots of activity  ROS: See pertinent positives and negatives per HPI.  Past Medical History  Diagnosis Date  . SVD (spontaneous vaginal delivery)     x 3  . Anemia   . Sleep apnea     lost weight 2013 gastric sleeve, no longer a problem  . Fatigue   . Tobacco use   . Obesity     Past Surgical History  Procedure Laterality Date  . Cesarean section      x 1  . Sleeve gastroplasty  2013    in [place]  . Robotic assisted total hysterectomy N/A 09/27/2013    Procedure: ROBOTIC ASSISTED TOTAL HYSTERECTOMY;  Surgeon: Lahoma Crocker, MD;  Location: [facility];  Service: Gynecology;  Laterality: N/A;  . Bilateral salpingectomy Bilateral 09/27/2013    Procedure: BILATERAL SALPINGECTOMY;  Surgeon: Lahoma Crocker, MD;  Location: [facility];  Service: Gynecology;  Laterality: Bilateral;  . Laparotomy Left 09/27/2013    Procedure: EXPLORATORY LAPAROTOMY W/ EXPLORATION OF LEFT ILLIAC ARTERY & VEIN;  Surgeon: Lahoma Crocker, MD;  Location: [facility];  Service: Gynecology;  Laterality: Left;    Family History  Problem Relation Age of Onset  . Hypertension Mother   . Colon cancer Maternal Grandfather 45  . Colon cancer Maternal Aunt     onset in 75s  . Stomach cancer Maternal Aunt   . Leukemia Paternal Grandmother   . Colon polyps Mother   . Colon polyps Maternal Uncle   . Colon polyps Maternal Aunt     Social History   Social History  . Marital Status: Married    Spouse Name: N/A  . Number of Children: N/A  . Years of Education: N/A   Social History Main Topics  . Smoking status: Current Every Day Smoker -- 1.00 packs/day for 25 years    Types: Cigarettes  . Smokeless tobacco: Current  User     Comment: vape  . Alcohol Use: 0.0 oz/week    0 Standard drinks or equivalent per week     Comment: socially  . Drug Use: No  . Sexual Activity:    Partners: Male    Birth Control/ Protection: None   Other Topics Concern  . None   Social History Narrative   Work or School: Producer, television/film/video, Armed forces technical officer Situation: lives with husband and 90 yo [place] in 2016      Spiritual Beliefs: Christian      Lifestyle: no regular exercise, poor diet           Current outpatient prescriptions:  .  BIOTIN PO, Take by mouth., Disp: , Rfl:  .  buPROPion (WELLBUTRIN SR) 150 MG 12 hr tablet, Take 1 tablet (150 mg total) by mouth daily., Disp: 90 tablet, Rfl: 0 .  ibuprofen (ADVIL,MOTRIN) 200 MG tablet, Take 200 mg by mouth every 6 (six) hours as needed., Disp: , Rfl:  .  Multiple Vitamin (MULTIVITAMIN WITH MINERALS) TABS tablet, Take 1 tablet by mouth daily., Disp: , Rfl:  .  NON FORMULARY, , Disp: , Rfl:  .  traZODone (DESYREL) 50 MG tablet, Take 25 mg by mouth at bedtime as needed for sleep., Disp: , Rfl:  .  vitamin  B-12 (CYANOCOBALAMIN) 100 MCG tablet, Take 100 mcg by mouth daily., Disp: , Rfl:   EXAM:  Filed Vitals:   05/15/15 0957  BP: 120/88  Pulse: 85  Temp: 98.4 F (36.9 C)    Body mass index is 35.21 kg/(m^2).  GENERAL: vitals reviewed and listed above, alert, oriented, appears well hydrated and in no acute distress  HEENT: atraumatic, conjunttiva clear, no obvious abnormalities on inspection of external nose and ears  NECK: no obvious masses on inspection  LUNGS: clear to auscultation bilaterally, no wheezes, rales or rhonchi, good air movement  CV: HRRR, no peripheral edema  ABD: no CVA TTP  MS: moves all extremities without noticeable abnormality, normal gait, normal inspection legs and hips - no edema, swelling or deformity, no leg length discrepancy, tight IT band with mild TTP here, without sig TTP or bony TTP, normals strength/sensitivity to touch  and DTRs LE bilat, neg hip compression test, no hip socket pain  PSYCH: pleasant and cooperative, no obvious depression or anxiety  ASSESSMENT AND PLAN:  Discussed the following assessment and plan:  Dysuria - Plan: POC Urinalysis Dipstick, Culture, Urine, Urinalysis, microscopic only -udip with tr bld - check micro and culture  Hip pain, unspecified laterality -we discussed possible serious and likely etiologies, workup and treatment, treatment risks and return precautions -suspect IT band issue -opted for HEP, analgesics if needed, posture change and follow up in 1 month or sooner if needed  -Patient advised to return or notify a doctor immediately if symptoms worsen or persist or new concerns arise.  Patient Instructions  BEFORE YOU LEAVE: -schedule follow up for the hip pain in 1 month -IT band exercises  We are doing further studies on the urine to see if there is an infection.  It can take up to 1-2 weeks for results and processing. We will contact you with instructions IF your results are abnormal. Normal results will be released to your [facility]. If you have not heard from [place] or can not find your results in [facility] in 2 weeks please contact our office.  Do the exercises for the hip 3-4 days per week  Avoid sitting or sleeping with legs crossed  Aleve or tylenol for pain per instructions as needed            Colin Benton R.

## 2015-05-18 LAB — URINE CULTURE

## 2015-05-18 NOTE — Addendum Note (Signed)
Addended by: Agnes Lawrence on: 05/18/2015 01:31 PM   Modules accepted: Orders

## 2015-05-19 ENCOUNTER — Other Ambulatory Visit (INDEPENDENT_AMBULATORY_CARE_PROVIDER_SITE_OTHER): Payer: 59

## 2015-05-19 ENCOUNTER — Other Ambulatory Visit: Payer: 59

## 2015-05-19 DIAGNOSIS — R300 Dysuria: Secondary | ICD-10-CM | POA: Diagnosis not present

## 2015-05-21 LAB — URINE CULTURE
Colony Count: NO GROWTH
Organism ID, Bacteria: NO GROWTH

## 2015-05-22 ENCOUNTER — Telehealth: Payer: Self-pay | Admitting: Family Medicine

## 2015-05-22 NOTE — Telephone Encounter (Signed)
Patient had repeat culture and this was negative.

## 2015-05-22 NOTE — Telephone Encounter (Signed)
I called the pt and she stated she did not receive my previous message with the results.  I advised her the culture was normal and she stated she is still having symptoms like a UTI.  I offered an appt for Monday and she stated the only day she can come in is on a Friday.  I scheduled her an appt for 1/20.

## 2015-05-22 NOTE — Telephone Encounter (Signed)
I left a message for the pt to return my call. 

## 2015-05-22 NOTE — Telephone Encounter (Signed)
Per result note just needs repeat clean catch culture. Did not need appt unless she wishes/worsening/other symptoms. Thanks.

## 2015-05-22 NOTE — Telephone Encounter (Signed)
Pt would like a call back about urine culture test

## 2015-05-29 ENCOUNTER — Ambulatory Visit (INDEPENDENT_AMBULATORY_CARE_PROVIDER_SITE_OTHER): Payer: 59 | Admitting: Family Medicine

## 2015-05-29 ENCOUNTER — Other Ambulatory Visit (HOSPITAL_COMMUNITY)
Admission: RE | Admit: 2015-05-29 | Discharge: 2015-05-29 | Disposition: A | Discharge status: Home / Self Care | Payer: 59 | Source: Ambulatory Visit | Attending: Family Medicine | Admitting: Family Medicine

## 2015-05-29 ENCOUNTER — Encounter: Payer: Self-pay | Admitting: Family Medicine

## 2015-05-29 VITALS — BP 136/92 | HR 97 | Temp 98.4°F | Ht 64.0 in | Wt 201.0 lb

## 2015-05-29 DIAGNOSIS — T148 Other injury of unspecified body region: Secondary | ICD-10-CM | POA: Diagnosis not present

## 2015-05-29 DIAGNOSIS — N760 Acute vaginitis: Secondary | ICD-10-CM | POA: Insufficient documentation

## 2015-05-29 DIAGNOSIS — IMO0002 Reserved for concepts with insufficient information to code with codable children: Secondary | ICD-10-CM

## 2015-05-29 DIAGNOSIS — R300 Dysuria: Secondary | ICD-10-CM | POA: Diagnosis not present

## 2015-05-29 DIAGNOSIS — R030 Elevated blood-pressure reading, without diagnosis of hypertension: Secondary | ICD-10-CM

## 2015-05-29 DIAGNOSIS — R3915 Urgency of urination: Secondary | ICD-10-CM | POA: Diagnosis not present

## 2015-05-29 DIAGNOSIS — Z113 Encounter for screening for infections with a predominantly sexual mode of transmission: Secondary | ICD-10-CM | POA: Insufficient documentation

## 2015-05-29 DIAGNOSIS — IMO0001 Reserved for inherently not codable concepts without codable children: Secondary | ICD-10-CM

## 2015-05-29 DIAGNOSIS — F17200 Nicotine dependence, unspecified, uncomplicated: Secondary | ICD-10-CM

## 2015-05-29 LAB — POCT URINALYSIS DIPSTICK
BILIRUBIN UA: NEGATIVE
Glucose, UA: NEGATIVE
Ketones, UA: NEGATIVE
Leukocytes, UA: NEGATIVE
NITRITE UA: NEGATIVE
PH UA: 7
PROTEIN UA: NEGATIVE
RBC UA: NEGATIVE
Spec Grav, UA: 1.02
UROBILINOGEN UA: 0.2

## 2015-05-29 MED ORDER — CEPHALEXIN 500 MG PO CAPS: 500.0000 mg | ORAL_CAPSULE | Freq: Three times a day (TID) | ORAL | Status: DC

## 2015-05-29 NOTE — Patient Instructions (Addendum)
BEFORE YOU LEAVE: -schedule follow up as scheduled  Start antibiotic  Keep heal clean and dry  Quit smoking  We have ordered labs or studies at this visit. It can take up to 1-2 weeks for results and processing. We will contact you with instructions IF your results are abnormal. Normal results will be released to your [facility]. If you have not heard from [place] or can not find your results in [facility] in 2 weeks please contact our office.  If we do not find a cause for your urinary symptoms on the labs, then we will place referral for you to have an evaluation with the urologist.

## 2015-05-29 NOTE — Progress Notes (Addendum)
HPI:  Acute visit for:  Recurrent Dysuria: -neg dip 1/6 with mbm on cx; repeat culture 1/10 clean -reports: persistent urinary urgency and frequency and occ dysuria -denies: fevers, flank pain, vaginal symptoms, nausea, vomiting hematuria -FDLMP: n/a  Lac R heal: -occurred 5 days ago on door frame -washed it well and bled a lot -mild pain and swelling, no pus -reports last tetanus booster 2 years ago  ROS: See pertinent positives and negatives per HPI.  Past Medical History  Diagnosis Date  . SVD (spontaneous vaginal delivery)     x 3  . Anemia   . Sleep apnea     lost weight 2013 gastric sleeve, no longer a problem  . Fatigue   . Tobacco use   . Obesity     Past Surgical History  Procedure Laterality Date  . Cesarean section      x 1  . Sleeve gastroplasty  2013    in [place]  . Robotic assisted total hysterectomy N/A 09/27/2013    Procedure: ROBOTIC ASSISTED TOTAL HYSTERECTOMY;  Surgeon: Lahoma Crocker, MD;  Location: [facility];  Service: Gynecology;  Laterality: N/A;  . Bilateral salpingectomy Bilateral 09/27/2013    Procedure: BILATERAL SALPINGECTOMY;  Surgeon: Lahoma Crocker, MD;  Location: [facility];  Service: Gynecology;  Laterality: Bilateral;  . Laparotomy Left 09/27/2013    Procedure: EXPLORATORY LAPAROTOMY W/ EXPLORATION OF LEFT ILLIAC ARTERY & VEIN;  Surgeon: Lahoma Crocker, MD;  Location: [facility];  Service: Gynecology;  Laterality: Left;    Family History  Problem Relation Age of Onset  . Hypertension Mother   . Colon cancer Maternal Grandfather 53  . Colon cancer Maternal Aunt     onset in 73s  . Stomach cancer Maternal Aunt   . Leukemia Paternal Grandmother   . Colon polyps Mother   . Colon polyps Maternal Uncle   . Colon polyps Maternal Aunt     Social History   Social History  . Marital Status: Married    Spouse Name: N/A  . Number of Children: N/A  . Years of Education: N/A   Social History Main Topics  . Smoking status: Current Every  Day Smoker -- 1.00 packs/day for 25 years    Types: Cigarettes  . Smokeless tobacco: Current User     Comment: vape  . Alcohol Use: 0.0 oz/week    0 Standard drinks or equivalent per week     Comment: socially  . Drug Use: No  . Sexual Activity:    Partners: Male    Birth Control/ Protection: None   Other Topics Concern  . 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  BIOTIN PO, Take by mouth., Disp: , Rfl:  .  buPROPion (WELLBUTRIN SR) 150 MG 12 hr tablet, Take 1 tablet (150 mg total) by mouth daily., Disp: 90 tablet, Rfl: 0 .  ibuprofen (ADVIL,MOTRIN) 200 MG tablet, Take 200 mg by mouth every 6 (six) hours as needed., Disp: , Rfl:  .  Multiple Vitamin (MULTIVITAMIN WITH MINERALS) TABS tablet, Take 1 tablet by mouth daily., Disp: , Rfl:  .  NON FORMULARY, , Disp: , Rfl:  .  traZODone (DESYREL) 50 MG tablet, Take  25 mg by mouth at bedtime as needed for sleep., Disp: , Rfl:  .  vitamin B-12 (CYANOCOBALAMIN) 100 MCG tablet, Take 100 mcg by mouth daily., Disp: , Rfl:  .  cephALEXin (KEFLEX) 500 MG capsule, Take 1 capsule (500 mg total) by mouth 3 (three) times daily., Disp: 15 capsule, Rfl: 0  EXAM:  Filed Vitals:   05/29/15 1020  BP: 136/92  Pulse: 97  Temp: 98.4 F (36.9 C)    Body mass index is 34.48 kg/(m^2).  GENERAL: vitals reviewed and listed above, alert, oriented, appears well hydrated and in no acute distress  HEENT: atraumatic, conjunttiva clear, no obvious abnormalities on inspection of external nose and ears  NECK: no obvious masses on inspection  LUNGS: clear to auscultation bilaterally, no wheezes, rales or rhonchi, good air movement  CV: HRRR, no peripheral edema  ABD: BS+, soft, NTTP  GU: normal appearance on external and  internal genitalia except for white homogenous vaginal discharge, no CMT, no sig uterine or bladder prolapse  SKIN: 3-4 cm superficial lac R heal with mild erythema and TTP, no pus   MS: moves all extremities without noticeable abnormality  PSYCH: pleasant and cooperative, no obvious depression or anxiety  ASSESSMENT AND PLAN:  Discussed the following assessment and plan:  Dysuria  Urinary urgency  Skin laceration  Elevated blood pressure  Tobacco use disorder  -keflex for heal and advised follow up if any worsening or not healing well -wet prep and GC/CLam/Trich pending - urology eval if unrevealing and/or any persistent dysuria -BP better on recheck and she reports stressed out - agrees to follow up to recheck in 2 weeks -advised to quit smoking and offered help -Patient advised to return or notify a doctor immediately if symptoms worsen or persist or new concerns arise.  Patient Instructions  BEFORE YOU LEAVE: -schedule follow up as scheduled  Start antibiotic  Keep heal clean and dry  Quit smoking  We have ordered labs or studies at this visit. It can take up to 1-2 weeks for results and processing. We will contact you with instructions IF your results are abnormal. Normal results will be released to your [facility] If you have not heard from [place] or can not find your results in [facility] in 2 weeks please contact our office.  If we do not find a cause for your urinary symptoms on the labs, then we will place referral for you to have an evaluation with the urologist.            [name].

## 2015-05-29 NOTE — Addendum Note (Signed)
Addended by: Agnes Lawrence on: 05/29/2015 10:58 AM   Modules accepted: Orders

## 2015-05-29 NOTE — Progress Notes (Signed)
Pre visit review using our clinic review tool, if applicable. No additional management support is needed unless otherwise documented below in the visit note. 

## 2015-06-01 LAB — CERVICOVAGINAL ANCILLARY ONLY
Chlamydia: NEGATIVE
Neisseria Gonorrhea: NEGATIVE
Trichomonas: NEGATIVE

## 2015-06-08 LAB — CERVICOVAGINAL ANCILLARY ONLY
BACTERIAL VAGINITIS: POSITIVE — AB
Candida vaginitis: NEGATIVE

## 2015-06-09 MED ORDER — METRONIDAZOLE 500 MG PO TABS: 500.0000 mg | ORAL_TABLET | Freq: Two times a day (BID) | ORAL | Status: DC

## 2015-06-09 NOTE — Addendum Note (Signed)
Addended by: Agnes Lawrence on: 06/09/2015 08:34 AM   Modules accepted: Orders

## 2015-06-16 ENCOUNTER — Ambulatory Visit (INDEPENDENT_AMBULATORY_CARE_PROVIDER_SITE_OTHER): Payer: 59 | Admitting: Family Medicine

## 2015-06-16 DIAGNOSIS — R69 Illness, unspecified: Secondary | ICD-10-CM

## 2015-06-16 NOTE — Progress Notes (Signed)
NO SHOW

## 2015-10-08 ENCOUNTER — Encounter: Payer: Self-pay | Admitting: Family Medicine

## 2015-10-08 ENCOUNTER — Telehealth: Payer: Self-pay | Admitting: Family Medicine

## 2015-10-08 ENCOUNTER — Ambulatory Visit (INDEPENDENT_AMBULATORY_CARE_PROVIDER_SITE_OTHER): Payer: 59 | Admitting: Family Medicine

## 2015-10-08 VITALS — BP 122/94 | HR 111 | Temp 97.8°F | Ht 64.0 in | Wt 206.9 lb

## 2015-10-08 DIAGNOSIS — F17200 Nicotine dependence, unspecified, uncomplicated: Secondary | ICD-10-CM

## 2015-10-08 DIAGNOSIS — R030 Elevated blood-pressure reading, without diagnosis of hypertension: Secondary | ICD-10-CM | POA: Diagnosis not present

## 2015-10-08 DIAGNOSIS — J988 Other specified respiratory disorders: Secondary | ICD-10-CM | POA: Diagnosis not present

## 2015-10-08 DIAGNOSIS — IMO0001 Reserved for inherently not codable concepts without codable children: Secondary | ICD-10-CM

## 2015-10-08 DIAGNOSIS — L309 Dermatitis, unspecified: Secondary | ICD-10-CM

## 2015-10-08 MED ORDER — BENZONATATE 100 MG PO CAPS: 100.0000 mg | ORAL_CAPSULE | Freq: Three times a day (TID) | ORAL | Status: DC | PRN

## 2015-10-08 MED ORDER — VARENICLINE TARTRATE 1 MG PO TABS: 1.0000 mg | ORAL_TABLET | Freq: Two times a day (BID) | ORAL | Status: DC

## 2015-10-08 MED ORDER — AZITHROMYCIN 250 MG PO TABS: ORAL_TABLET | ORAL | Status: DC

## 2015-10-08 MED ORDER — TRIAMCINOLONE ACETONIDE 0.1 % EX OINT: 1.0000 "application " | TOPICAL_OINTMENT | Freq: Two times a day (BID) | CUTANEOUS | Status: DC

## 2015-10-08 MED ORDER — VARENICLINE TARTRATE 0.5 MG X 11 & 1 MG X 42 PO MISC: ORAL | Status: DC

## 2015-10-08 MED ORDER — PREDNISONE 20 MG PO TABS: 40.0000 mg | ORAL_TABLET | Freq: Every day | ORAL | Status: DC

## 2015-10-08 NOTE — Telephone Encounter (Signed)
Error/ltd ° °

## 2015-10-08 NOTE — Addendum Note (Signed)
Addended by: Agnes Lawrence on: 10/08/2015 05:38 PM   Modules accepted: Orders

## 2015-10-08 NOTE — Progress Notes (Signed)
Pre visit review using our clinic review tool, if applicable. No additional management support is needed unless otherwise documented below in the visit note. 

## 2015-10-08 NOTE — Telephone Encounter (Signed)
Appt scheduled to see Dr. Maudie Mercury on 10/09/15

## 2015-10-08 NOTE — Telephone Encounter (Signed)
Patient is in the office to see Dr Maudie Mercury.

## 2015-10-08 NOTE — Telephone Encounter (Signed)
I called the pt and informed her of the message below and she stated she feels like she her breathing problem is coming from her chest and she has used Mucinex with no relief.  Appt scheduled for today at 5pm.

## 2015-10-08 NOTE — Telephone Encounter (Signed)
Patient Name: Kayla [name] DOB: 09/01/1970 Initial Comment Caller states she may have a bad respiratory infection. Coughing up yellow-green thick phelm, trouble taking a deep breath. Trouble breathing. Nurse Assessment Nurse: Ronnald Ramp, RN, Miranda Date/Time (Eastern Time): 10/08/2015 10:30:16 AM Confirm and document reason for call. If symptomatic, describe symptoms. You must click the next button to save text entered. ---Caller states she has had a productive cough for 2 weeks. Also with trouble breathing that improves after clearing the mucus. Has the patient traveled out of the country within the last 30 days? ---No Does the patient have any new or worsening symptoms? ---Yes Will a triage be completed? ---Yes Related visit to physician within the last 2 weeks? ---No Does the PT have any chronic conditions? (i.e. diabetes, asthma, etc.) ---No Is the patient pregnant or possibly pregnant? (Ask all females between the ages of 30-55) ---No Is this a behavioral health or substance abuse call? ---No Guidelines Guideline Title Affirmed Question Affirmed Notes Cough - Acute Productive SEVERE coughing spells (e.g., whooping sound after coughing, vomiting after coughing) Final Disposition User See Physician within 24 Hours Ronnald Ramp, RN, Miranda Comments Appt scheduled with PCP, Dr. Maudie Mercury, for tomorrow 6/2 at 9:15a Referrals REFERRED TO PCP OFFICE Disagree/Comply: Comply

## 2015-10-08 NOTE — Progress Notes (Signed)
HPI:  Acute visit for several issues:  Cough and congestion: -started: started 1-1.5 weeks ago, worsening -symptoms:nasal congestion thick and discolored, sinus discomfort, sore throat, cough, streak of blood in sputum once last week, mild sob -denies:fever, NVD, tooth pain -has tried: OTC tx options -sick contacts/travel/risks: no reported flu, strep or tick exposure -Hx of: tobacco use  Tobacco use: -wants to quit -tried wellbutrin but did not help her quit -no depression or anxiety currently -now down to 1/2 ppd -knows needs to quit but feels need medication to help, friend taking chantix and she wants to try this  Hand eczema: -protecting hands which helps -washes hands a lot due to job -not using moisterizer or steroid -no bleeding, oozing or drainage  ROS: See pertinent positives and negatives per HPI.  Past Medical History  Diagnosis Date  . SVD (spontaneous vaginal delivery)     x 3  . Anemia   . Sleep apnea     lost weight 2013 gastric sleeve, no longer a problem  . Fatigue   . Tobacco use   . Obesity     Past Surgical History  Procedure Laterality Date  . Cesarean section      x 1  . Sleeve gastroplasty  2013    in [place]  . Robotic assisted total hysterectomy N/A 09/27/2013    Procedure: ROBOTIC ASSISTED TOTAL HYSTERECTOMY;  Surgeon: Lahoma Crocker, MD;  Location: [facility];  Service: Gynecology;  Laterality: N/A;  . Bilateral salpingectomy Bilateral 09/27/2013    Procedure: BILATERAL SALPINGECTOMY;  Surgeon: Lahoma Crocker, MD;  Location: [facility];  Service: Gynecology;  Laterality: Bilateral;  . Laparotomy Left 09/27/2013    Procedure: EXPLORATORY LAPAROTOMY W/ EXPLORATION OF LEFT ILLIAC ARTERY & VEIN;  Surgeon: Lahoma Crocker, MD;  Location: [facility];  Service: Gynecology;  Laterality: Left;    Family History  Problem Relation Age of Onset  . Hypertension Mother   . Colon cancer Maternal Grandfather 12  . Colon cancer Maternal Aunt     onset  in 15s  . Stomach cancer Maternal Aunt   . Leukemia Paternal Grandmother   . Colon polyps Mother   . Colon polyps Maternal Uncle   . Colon polyps Maternal Aunt     Social History   Social History  . Marital Status: Married    Spouse Name: N/A  . Number of Children: N/A  . Years of Education: N/A   Social History Main Topics  . Smoking status: Current Every Day Smoker -- 1.00 packs/day for 25 years    Types: Cigarettes  . Smokeless tobacco: Current User     Comment: vape  . Alcohol Use: 0.0 oz/week    0 Standard drinks or equivalent per week     Comment: socially  . Drug Use: No  . Sexual Activity:    Partners: Male    Birth Control/ Protection: None   Other Topics Concern  . 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  BIOTIN PO, Take by mouth., Disp: , Rfl:  .  buPROPion (WELLBUTRIN SR) 150 MG 12 hr tablet, Take 1 tablet (150 mg total) by mouth daily., Disp: 90 tablet, Rfl: 0 .  ibuprofen (ADVIL,MOTRIN) 200 MG tablet, Take 200 mg by mouth every 6 (six) hours as needed., Disp: , Rfl:  .  Multiple Vitamin (MULTIVITAMIN WITH MINERALS) TABS tablet, Take 1 tablet by mouth daily., Disp: , Rfl:  .  NON FORMULARY, , Disp: , Rfl:  .  traZODone (DESYREL) 50 MG tablet, Take 25 mg by mouth at bedtime as needed for sleep., Disp: , Rfl:  .  vitamin B-12 (CYANOCOBALAMIN) 100 MCG tablet, Take 100 mcg by mouth daily., Disp: , Rfl:  .  azithromycin (ZITHROMAX) 250 MG tablet, 2 tabs day 1, then one tab daily, Disp: 6 tablet, Rfl: 0 .  predniSONE (DELTASONE) 20 MG tablet, Take 2 tablets (40 mg total) by mouth daily with breakfast., Disp: 8 tablet, Rfl: 0 .  triamcinolone ointment (KENALOG) 0.1 %, Apply 1 application topically 2 (two) times daily., Disp: 30 g,  Rfl: 0 .  varenicline (CHANTIX CONTINUING MONTH PAK) 1 MG tablet, Take 1 tablet (1 mg total) by mouth 2 (two) times daily., Disp: 120 tablet, Rfl: 0 .  varenicline (CHANTIX STARTING MONTH PAK) 0.5 MG X 11 & 1 MG X 42 tablet, Take one 0.5 mg tablet by mouth once daily for 3 days, then increase to one 0.5 mg tablet twice daily for 4 days, then increase to one 1 mg tablet twice daily., Disp: 53 tablet, Rfl: 0  EXAM:  Filed Vitals:   10/08/15 1652  BP: 122/94  Pulse: 111  Temp: 97.8 F (36.6 C)    Body mass index is 35.5 kg/(m^2).  GENERAL: vitals reviewed and listed above, alert, oriented, appears well hydrated and in no acute distress  HEENT: atraumatic, conjunttiva clear, no obvious abnormalities on inspection of external nose and ears, normal appearance of ear canals and TMs, clear nasal congestion, mild post oropharyngeal erythema with PND, no tonsillar edema or exudate, no sinus TTP  NECK: no obvious masses on inspection  LUNGS: clear to auscultation bilaterally, no wheezes, rales or rhonchi, good air movement  CV: HRRR, no peripheral edema  MS: moves all extremities without noticeable abnormality  PSYCH: pleasant and cooperative, no obvious depression or anxiety  ASSESSMENT AND PLAN:  Discussed the following assessment and plan:  Respiratory infection - Plan: predniSONE (DELTASONE) 20 MG tablet, azithromycin (ZITHROMAX) 250 MG tablet, DG Chest 2 View -given duration, worsening, thick sputum opted for tx with abx and prednisone; discussed risks -cxr given ? Hemoptysis and smoking hx -return precautions discussed  Tobacco use disorder - Plan: DG Chest 2 View -advised to quit, offered help, counseled 3-5 minutes -trial chantix after discussion risks, rx sent  Eczema of both hands - Plan: triamcinolone ointment (KENALOG) 0.1 % -emollient, protection, steroid -f/u in 1 month  Elevated BP -recheck before leaving and at [facility] in 1-2 months  -given HPI and exam findings  today, a serious infection or illness is unlikely. We discussed potential etiologies, with VURI being most likely, and advised supportive care and monitoring. We discussed treatment side effects, likely course, antibiotic misuse, transmission, and signs of developing a serious illness. -of course, we advised to return or notify a doctor immediately if symptoms worsen or persist or new concerns arise.    Patient Instructions  BEFORE YOU LEAVE: -recheck BP before eleaving -xray sheet -schedule physical in 1-2 months; come fasting 8 hours if possible to do labs, drink plenty of water  Start the antibiotic and the prednisone. Follow up if symptoms worsen or persist despite treatment.  Get the chest xray  For the hands -  aquaphor or petroleum jelly several times daily, topical steroid ointment (sent to pharmacy) 2x daily, keep hands protected - use gloves.  Once feeling better and done with antibiotic - start the chantix starter pack - follow instructions carefully. Quit smoking 1 week after starting chantix.       Colin Benton R.

## 2015-10-08 NOTE — Telephone Encounter (Signed)
If trouble breathing is nasal ok to wait to appt, but if SOB or dyspnea needs to be seen today/asap. Thanks.

## 2015-10-08 NOTE — Telephone Encounter (Signed)
I left a message for the pt to return my call. 

## 2015-10-08 NOTE — Telephone Encounter (Signed)
Pt returned your call and asked that you call her at [phone].

## 2015-10-08 NOTE — Patient Instructions (Addendum)
BEFORE YOU LEAVE: -recheck BP before eleaving -xray sheet -schedule physical in 1-2 months; come fasting 8 hours if possible to do labs, drink plenty of water  Start the antibiotic and the prednisone. Follow up if symptoms worsen or persist despite treatment.  Get the chest xray  For the hands - aquaphor or petroleum jelly several times daily, topical steroid ointment (sent to pharmacy) 2x daily, keep hands protected - use gloves.  Once feeling better and done with antibiotic - start the chantix starter pack - follow instructions carefully. Quit smoking 1 week after starting chantix.

## 2015-10-09 ENCOUNTER — Ambulatory Visit (INDEPENDENT_AMBULATORY_CARE_PROVIDER_SITE_OTHER)
Admission: RE | Admit: 2015-10-09 | Discharge: 2015-10-09 | Disposition: A | Discharge status: Home / Self Care | Payer: 59 | Source: Ambulatory Visit | Attending: Family Medicine | Admitting: Family Medicine

## 2015-10-09 ENCOUNTER — Ambulatory Visit: Payer: Self-pay | Admitting: Family Medicine

## 2015-10-09 DIAGNOSIS — F17200 Nicotine dependence, unspecified, uncomplicated: Secondary | ICD-10-CM

## 2015-10-09 DIAGNOSIS — J988 Other specified respiratory disorders: Secondary | ICD-10-CM

## 2015-10-09 DIAGNOSIS — R079 Chest pain, unspecified: Secondary | ICD-10-CM | POA: Diagnosis not present

## 2015-10-09 DIAGNOSIS — R05 Cough: Secondary | ICD-10-CM | POA: Diagnosis not present

## 2015-10-09 DIAGNOSIS — R0602 Shortness of breath: Secondary | ICD-10-CM | POA: Diagnosis not present

## 2015-10-09 MED FILL — CHANTIX STARTING MONTH BOX: 0.5 MG X 11 | 28 days supply | Qty: 53 | Fill #0

## 2015-10-14 ENCOUNTER — Telehealth: Payer: Self-pay | Admitting: Family Medicine

## 2015-10-14 NOTE — Telephone Encounter (Signed)
Received a request for pa on Chantix starter pack.  Submitted on CoverMyMeds.  Waiting on a response.

## 2015-10-21 NOTE — Progress Notes (Signed)
Submitted PA through CoverMyMeds for varenicline (CHANTIX STARTING MONTH PAK) 0.5 MG X 11 & 1.

## 2015-11-24 ENCOUNTER — Encounter: Payer: Self-pay | Admitting: Family Medicine

## 2015-11-24 ENCOUNTER — Ambulatory Visit (INDEPENDENT_AMBULATORY_CARE_PROVIDER_SITE_OTHER)
Admission: RE | Admit: 2015-11-24 | Discharge: 2015-11-24 | Disposition: A | Discharge status: Home / Self Care | Payer: 59 | Source: Ambulatory Visit | Attending: Family Medicine | Admitting: Family Medicine

## 2015-11-24 ENCOUNTER — Ambulatory Visit (INDEPENDENT_AMBULATORY_CARE_PROVIDER_SITE_OTHER): Payer: 59 | Admitting: Family Medicine

## 2015-11-24 VITALS — BP 116/84 | HR 95 | Temp 98.4°F | Ht 64.0 in | Wt 207.8 lb

## 2015-11-24 DIAGNOSIS — R300 Dysuria: Secondary | ICD-10-CM | POA: Diagnosis not present

## 2015-11-24 DIAGNOSIS — N393 Stress incontinence (female) (male): Secondary | ICD-10-CM | POA: Diagnosis not present

## 2015-11-24 DIAGNOSIS — M25551 Pain in right hip: Secondary | ICD-10-CM

## 2015-11-24 DIAGNOSIS — M1611 Unilateral primary osteoarthritis, right hip: Secondary | ICD-10-CM | POA: Diagnosis not present

## 2015-11-24 LAB — POCT URINALYSIS DIPSTICK
Bilirubin, UA: NEGATIVE
Glucose, UA: NEGATIVE
KETONES UA: NEGATIVE
Nitrite, UA: POSITIVE
PH UA: 6
PROTEIN UA: NEGATIVE
SPEC GRAV UA: 1.02
Urobilinogen, UA: 0.2

## 2015-11-24 MED ORDER — NITROFURANTOIN MONOHYD MACRO 100 MG PO CAPS: 100.0000 mg | ORAL_CAPSULE | Freq: Two times a day (BID) | ORAL | Status: DC

## 2015-11-24 NOTE — Progress Notes (Signed)
HPI:  Acute visit for several issues:  UTI: -started 3 weeks ago, but was trying to wait until her physical at the end of this month -Symptoms include frequency, urgency, dysuria and some low back pain -Denies fevers, nausea, vomiting, flank pain, gross hematuria -History of UTI, status post hysterectomy, mild stress incontinence  Right hip pain: -Chronic, reports started after getting Repeated mattress -Chronically sleeps on the side, now when sleep on the side has right lateral hip pain -History of chronic mild low back pain at times, does not seem to radiate or be related -Denies fevers, malaise, weight loss, radiation of pain, weakness or numbness of the lower extremities, loss of bowel or bladder function -Did home exercises in the past, but these did not help  ROS: See pertinent positives and negatives per HPI.  Past Medical History  Diagnosis Date  . SVD (spontaneous vaginal delivery)     x 3  . Anemia   . Sleep apnea     lost weight 2013 gastric sleeve, no longer a problem  . Fatigue   . Tobacco use   . Obesity     Past Surgical History  Procedure Laterality Date  . Cesarean section      x 1  . Sleeve gastroplasty  2013    in [place]  . Robotic assisted total hysterectomy N/A 09/27/2013    Procedure: ROBOTIC ASSISTED TOTAL HYSTERECTOMY;  Surgeon: Lahoma Crocker, MD;  Location: [facility];  Service: Gynecology;  Laterality: N/A;  . Bilateral salpingectomy Bilateral 09/27/2013    Procedure: BILATERAL SALPINGECTOMY;  Surgeon: Lahoma Crocker, MD;  Location: [facility];  Service: Gynecology;  Laterality: Bilateral;  . Laparotomy Left 09/27/2013    Procedure: EXPLORATORY LAPAROTOMY W/ EXPLORATION OF LEFT ILLIAC ARTERY & VEIN;  Surgeon: Lahoma Crocker, MD;  Location: [facility];  Service: Gynecology;  Laterality: Left;    Family History  Problem Relation Age of Onset  . Hypertension Mother   . Colon cancer Maternal Grandfather 55  . Colon cancer Maternal Aunt     onset in  49s  . Stomach cancer Maternal Aunt   . Leukemia Paternal Grandmother   . Colon polyps Mother   . Colon polyps Maternal Uncle   . Colon polyps Maternal Aunt     Social History   Social History  . Marital Status: Married    Spouse Name: N/A  . Number of Children: N/A  . Years of Education: N/A   Social History Main Topics  . Smoking status: Current Every Day Smoker -- 1.00 packs/day for 25 years    Types: Cigarettes  . Smokeless tobacco: Current User     Comment: vape  . Alcohol Use: 0.0 oz/week    0 Standard drinks or equivalent per week     Comment: socially  . Drug Use: No  . Sexual Activity:    Partners: Male    Birth Control/ Protection: None   Other Topics Concern  . None   Social History Narrative   Work or School: Producer, television/film/video, Armed forces technical officer Situation: lives with husband and 12 yo [place] in 2016      Spiritual Beliefs: Christian      Lifestyle: no regular exercise, poor diet           Current outpatient prescriptions:  .  BIOTIN PO, Take by mouth., Disp: , Rfl:  .  buPROPion (WELLBUTRIN SR) 150 MG 12 hr tablet, Take 1 tablet (150 mg total) by mouth daily., Disp: 90 tablet,  Rfl: 0 .  ibuprofen (ADVIL,MOTRIN) 200 MG tablet, Take 200 mg by mouth every 6 (six) hours as needed., Disp: , Rfl:  .  Multiple Vitamin (MULTIVITAMIN WITH MINERALS) TABS tablet, Take 1 tablet by mouth daily., Disp: , Rfl:  .  NON FORMULARY, , Disp: , Rfl:  .  traZODone (DESYREL) 50 MG tablet, Take 25 mg by mouth at bedtime as needed for sleep., Disp: , Rfl:  .  triamcinolone ointment (KENALOG) 0.1 %, Apply 1 application topically 2 (two) times daily., Disp: 30 g, Rfl: 0 .  varenicline (CHANTIX CONTINUING MONTH PAK) 1 MG tablet, Take 1 tablet (1 mg total) by mouth 2 (two) times daily., Disp: 120 tablet, Rfl: 0 .  varenicline (CHANTIX STARTING MONTH PAK) 0.5 MG X 11 & 1 MG X 42 tablet, Take one 0.5 mg tablet by mouth once daily for 3 days, then increase to one 0.5 mg tablet twice  daily for 4 days, then increase to one 1 mg tablet twice daily., Disp: 53 tablet, Rfl: 0 .  vitamin B-12 (CYANOCOBALAMIN) 100 MCG tablet, Take 100 mcg by mouth daily., Disp: , Rfl:  .  nitrofurantoin, macrocrystal-monohydrate, (MACROBID) 100 MG capsule, Take 1 capsule (100 mg total) by mouth 2 (two) times daily., Disp: 14 capsule, Rfl: 0  EXAM:  Filed Vitals:   11/24/15 0950  BP: 116/84  Pulse: 95  Temp: 98.4 F (36.9 C)    Body mass index is 35.65 kg/(m^2).  GENERAL: vitals reviewed and listed above, alert, oriented, appears well hydrated and in no acute distress  HEENT: atraumatic, conjunttiva clear, no obvious abnormalities on inspection of external nose and ears  NECK: no obvious masses on inspection  LUNGS: clear to auscultation bilaterally, no wheezes, rales or rhonchi, good air movement  CV: HRRR, no peripheral edema  ABD: Bowel sounds positive, soft, nontender to palpation, no CVA tenderness to palpation  MS: moves all extremities without noticeable abnormality, normal gait, normal strength in lower extremities bilaterally along with normal sensitivity to light touch, she has tenderness to palpation along the greater trochanter and upper IT band region, hip range of motion seems to be preserved  PSYCH: pleasant and cooperative, no obvious depression or anxiety  ASSESSMENT AND PLAN:  Discussed the following assessment and plan:  Dysuria - Plan: POC Urinalysis Dipstick Stress incontinence -Urine dip and symptoms consistent with UTI, Treat UTI with Macrobid -Pelvic floor exercises -If continues to have recurrent UTI, incontinence issues may have her see a urologist or urogynecology if this is very bothersome to her  Right hip pain - Plan: DG HIP UNILAT W OR W/O PELVIS 2-3 VIEWS RIGHT -Plain films to further evaluate, she is less interested in injections, but may have her see sports medicine for further evaluation given persistent issues. OTC analgesics as needed in  interim.  She has a physical and the next 2 weeks, we'll plan to follow up on these issues then.  -Patient advised to return or notify a doctor immediately if symptoms worsen or persist or new concerns arise.  Patient Instructions  BEFORE YOU LEAVE: -follow up: keep physical as scheduled -xray sheet  Take the antibiotic (Macrobid) per instructions.  Kegel exercises, hold 5 seconds, 50 times daily  Get the xray of the hip     Colin Benton R., DO

## 2015-11-24 NOTE — Progress Notes (Signed)
Pre visit review using our clinic review tool, if applicable. No additional management support is needed unless otherwise documented below in the visit note. 

## 2015-11-24 NOTE — Patient Instructions (Signed)
BEFORE YOU LEAVE: -follow up: keep physical as scheduled -xray sheet  Take the antibiotic (Macrobid) per instructions.  Kegel exercises, hold 5 seconds, 50 times daily  Get the xray of the hip

## 2015-12-04 ENCOUNTER — Ambulatory Visit (INDEPENDENT_AMBULATORY_CARE_PROVIDER_SITE_OTHER): Payer: 59 | Admitting: Family Medicine

## 2015-12-04 ENCOUNTER — Encounter: Payer: Self-pay | Admitting: Family Medicine

## 2015-12-04 VITALS — BP 136/88 | HR 100 | Temp 97.6°F | Ht 64.0 in | Wt 207.7 lb

## 2015-12-04 DIAGNOSIS — M25551 Pain in right hip: Secondary | ICD-10-CM | POA: Diagnosis not present

## 2015-12-04 DIAGNOSIS — F17200 Nicotine dependence, unspecified, uncomplicated: Secondary | ICD-10-CM | POA: Diagnosis not present

## 2015-12-04 DIAGNOSIS — E669 Obesity, unspecified: Secondary | ICD-10-CM

## 2015-12-04 DIAGNOSIS — Z0000 Encounter for general adult medical examination without abnormal findings: Secondary | ICD-10-CM

## 2015-12-04 LAB — BASIC METABOLIC PANEL
BUN: 16 mg/dL (ref 6–23)
CO2: 30 meq/L (ref 19–32)
Calcium: 8.9 mg/dL (ref 8.4–10.5)
Chloride: 103 mEq/L (ref 96–112)
Creatinine, Ser: 0.73 mg/dL (ref 0.40–1.20)
GFR: 109.67 mL/min (ref >60.00)
GLUCOSE: 85 mg/dL (ref 70–99)
POTASSIUM: 4.2 meq/L (ref 3.5–5.1)
SODIUM: 136 meq/L (ref 135–145)

## 2015-12-04 LAB — LIPID PANEL
CHOLESTEROL: 137 mg/dL (ref 0–200)
HDL: 56.3 mg/dL (ref >39.00)
LDL Cholesterol: 70 mg/dL (ref 0–99)
NonHDL: 81.18
TRIGLYCERIDES: 57 mg/dL (ref 0.0–149.0)
Total CHOL/HDL Ratio: 2
VLDL: 11.4 mg/dL (ref 0.0–40.0)

## 2015-12-04 LAB — HEMOGLOBIN A1C: Hgb A1c MFr Bld: 5.7 % (ref 4.6–6.5)

## 2015-12-04 MED ORDER — BUPROPION HCL ER (SR) 150 MG PO TB12: 150.0000 mg | ORAL_TABLET | Freq: Every day | ORAL | 1 refills | Status: DC

## 2015-12-04 MED FILL — BUPROPION HCL SR 150 MG TAB: 150 | 90 days supply | Qty: 90 | Fill #0

## 2015-12-04 NOTE — Progress Notes (Signed)
HPI:  Here for CPE:  -Concerns and/or follow up today: She is still having the R hip pain and wants to see the sports medicine specialist. Still smoking. Feels wellburtin is more helpful the chantix, not taking either. Wants a medication that will make her quit. -Diet: poor diet, larger portion sizes  -Exercise: no regular exercise  -Taking folic acid, vitamin D or calcium: no  -Diabetes and Dyslipidemia Screening: FASTING for labs  -Hx of HTN: no  -Vaccines: UTD  -pap history:s/p hysterectomy for fibroids, sees gyn  -FDLMP:n/a  -wants STI testing (Hep C if born [birth year]): wants to check HIV  -FH breast, colon or ovarian ca: see FH Last mammogram: she agrees to schedule mammogram as just got letter, UTD Last colon cancer screening: n/a  -Alcohol, Tobacco, drug use: see social history  Review of Systems - no fevers, unintentional weight loss, vision loss, hearing loss, chest pain, sob, hemoptysis, melena, hematochezia, hematuria, genital discharge, changing or concerning skin lesions, bleeding, bruising, loc, thoughts of self harm or SI  Past Medical History:  Diagnosis Date  . Anemia   . Fatigue   . Obesity   . Sleep apnea    lost weight 2013 gastric sleeve, no longer a problem  . SVD (spontaneous vaginal delivery)    x 3  . Tobacco use     Past Surgical History:  Procedure Laterality Date  . BILATERAL SALPINGECTOMY Bilateral 09/27/2013   Procedure: BILATERAL SALPINGECTOMY;  Surgeon: Lahoma Crocker, MD;  Location: [facility];  Service: Gynecology;  Laterality: Bilateral;  . CESAREAN SECTION     x 1  . LAPAROTOMY Left 09/27/2013   Procedure: EXPLORATORY LAPAROTOMY W/ EXPLORATION OF LEFT ILLIAC ARTERY & VEIN;  Surgeon: Lahoma Crocker, MD;  Location: [facility];  Service: Gynecology;  Laterality: Left;  . ROBOTIC ASSISTED TOTAL HYSTERECTOMY N/A 09/27/2013   Procedure: ROBOTIC ASSISTED TOTAL HYSTERECTOMY;  Surgeon: Lahoma Crocker, MD;  Location: [facility];  Service:  Gynecology;  Laterality: N/A;  . SLEEVE GASTROPLASTY  2013   in [place]    Family History  Problem Relation Age of Onset  . Hypertension Mother   . Colon cancer Maternal Grandfather 23  . Colon cancer Maternal Aunt     onset in 83s  . Stomach cancer Maternal Aunt   . Leukemia Paternal Grandmother   . Colon polyps Mother   . Colon polyps Maternal Uncle   . Colon polyps Maternal Aunt     Social History   Social History  . Marital status: Married    Spouse name: N/A  . Number of children: N/A  . Years of education: N/A   Social History Main Topics  . Smoking status: Current Every Day Smoker    Packs/day: 1.00    Years: 25.00    Types: Cigarettes  . Smokeless tobacco: Current User     Comment: vape  . Alcohol use 0.0 oz/week     Comment: socially  . Drug use: No  . Sexual activity: Yes    Partners: Male    Birth control/ protection: None   Other Topics Concern  . 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  ibuprofen (ADVIL,MOTRIN) 200 MG tablet,  Take 200 mg by mouth every 6 (six) hours as needed., Disp: , Rfl:  .  Multiple Vitamin (MULTIVITAMIN WITH MINERALS) TABS tablet, Take 1 tablet by mouth daily., Disp: , Rfl:  .  NON FORMULARY, , Disp: , Rfl:  .  traZODone (DESYREL) 50 MG tablet, Take 25 mg by mouth at bedtime as needed for sleep., Disp: , Rfl:  .  triamcinolone ointment (KENALOG) 0.1 %, Apply 1 application topically 2 (two) times daily., Disp: 30 g, Rfl: 0 .  varenicline (CHANTIX CONTINUING MONTH PAK) 1 MG tablet, Take 1 tablet (1 mg total) by mouth 2 (two) times daily., Disp: 120 tablet, Rfl: 0 .  varenicline (CHANTIX STARTING MONTH PAK) 0.5 MG X 11 & 1 MG X 42 tablet, Take one 0.5 mg tablet by mouth once daily for 3 days, then  increase to one 0.5 mg tablet twice daily for 4 days, then increase to one 1 mg tablet twice daily., Disp: 53 tablet, Rfl: 0 .  vitamin B-12 (CYANOCOBALAMIN) 100 MCG tablet, Take 100 mcg by mouth daily., Disp: , Rfl:  .  BIOTIN PO, Take by mouth., Disp: , Rfl:  .  buPROPion (WELLBUTRIN SR) 150 MG 12 hr tablet, Take 1 tablet (150 mg total) by mouth daily., Disp: 90 tablet, Rfl: 1  EXAM:  Vitals:   12/04/15 1145  BP: 136/88  Pulse: 100  Temp: 97.6 F (36.4 C)    GENERAL: vitals reviewed and listed below, alert, oriented, appears well hydrated and in no acute distress  HEENT: head atraumatic, PERRLA, normal appearance of eyes, ears, nose and mouth. moist mucus membranes.  NECK: supple, no masses or lymphadenopathy  LUNGS: clear to auscultation bilaterally, no rales, rhonchi or wheeze  CV: HRRR, no peripheral edema or cyanosis, normal pedal pulses  BREAST: declined, does with gyn  ABDOMEN: bowel sounds normal, soft, non tender to palpation, no masses, no rebound or guarding  GU: declined, does with gyn  SKIN: no rash or abnormal lesions  MS: normal gait, moves all extremities normally  NEURO: normal gait, speech and thought processing grossly intact, muscle tone grossly intact throughout  PSYCH: normal affect, pleasant and cooperative  ASSESSMENT AND PLAN:  Discussed the following assessment and plan:  Encounter for preventive health examination - Plan: Hemoglobin A1c, Lipid Panel, HIV antibody (with reflex), Basic metabolic panel  Tobacco use disorder  Obesity  Hip pain, right - Plan: Ambulatory referral to Sports Medicine   -Discussed and advised all [place] preventive services health task force level A and B recommendations for age, sex and risks.  -Advised at least 150 minutes of exercise per week and a healthy diet with avoidance of (less then 1 serving per week) processed foods, white starches, red meat, fast foods and sweets and consisting of: * 5-9 servings of  fresh fruits and vegetables (not corn or potatoes) *nuts and seeds, beans *olives and olive oil *lean meats such as fish and white chicken  *whole grains Advised to quit smoking, counseled for 3-5 minutes. Opted to try wellbutrin again after discussion risks/benefits.  -FASTING, studies and vaccines per orders this encounter  Orders Placed This Encounter  Procedures  . Hemoglobin A1c  . Lipid Panel  . HIV antibody (with reflex)  . Basic metabolic panel  . Ambulatory referral to Sports Medicine    Referral Priority:   Routine    Referral Type:   Consultation    Referral Reason:   Patient Preference    Number of Visits Requested:   1  Patient advised to return to clinic immediately if symptoms worsen or persist or new concerns.  Patient Instructions  BEFORE YOU LEAVE: -follow up: 3 months -labs -quit line information  Schedule your mammogram.  Schedule your gynecologist exam.  -We placed a referral for you as discussed to the sports medicine physician. It usually takes about 1-2 weeks to process and schedule this referral. If you have not heard from [place] regarding this appointment in 2 weeks please contact our office.  Quit smoking: -start the wellbutrin today and take once dialy -stop smoking in 1 week -use nicotine gum for cravings  We recommend the following healthy lifestyle: 1) Small portions - eat off of salad plate instead of dinner plate 2) Eat a healthy clean diet with avoidance of (less then 1 serving per week) processed foods, sweetened drinks, white starches, red meat, fast foods and sweets and consisting of: * 5-9 servings per day of fresh or frozen fruits and vegetables (not corn or potatoes, not dried or canned) *nuts and seeds, beans *olives and olive oil *small portions of lean meats such as fish and white chicken  *small portions of whole grains 3)Get at least 150 minutes of sweaty aerobic exercise per week 4)reduce stress - counseling, meditation,  relaxation to balance other aspects of your life  We have ordered labs or studies at this visit. It can take up to 1-2 weeks for results and processing. IF results require follow up or explanation, we will call you with instructions. Clinically stable results will be released to your [facility]. If you have not heard from [place] or cannot find your results in [facility] in 2 weeks please contact our office at [phone].  If you are not yet signed up for [facility], please consider signing up.            No Follow-up on file.  Colin Benton R., DO

## 2015-12-04 NOTE — Progress Notes (Signed)
Pre visit review using our clinic review tool, if applicable. No additional management support is needed unless otherwise documented below in the visit note. 

## 2015-12-04 NOTE — Patient Instructions (Signed)
BEFORE YOU LEAVE: -follow up: 3 months -labs -quit line information  Schedule your mammogram.  Schedule your gynecologist exam.  -We placed a referral for you as discussed to the sports medicine physician. It usually takes about 1-2 weeks to process and schedule this referral. If you have not heard from [place] regarding this appointment in 2 weeks please contact our office.  Quit smoking: -start the wellbutrin today and take once dialy -stop smoking in 1 week -use nicotine gum for cravings  We recommend the following healthy lifestyle: 1) Small portions - eat off of salad plate instead of dinner plate 2) Eat a healthy clean diet with avoidance of (less then 1 serving per week) processed foods, sweetened drinks, white starches, red meat, fast foods and sweets and consisting of: * 5-9 servings per day of fresh or frozen fruits and vegetables (not corn or potatoes, not dried or canned) *nuts and seeds, beans *olives and olive oil *small portions of lean meats such as fish and white chicken  *small portions of whole grains 3)Get at least 150 minutes of sweaty aerobic exercise per week 4)reduce stress - counseling, meditation, relaxation to balance other aspects of your life  We have ordered labs or studies at this visit. It can take up to 1-2 weeks for results and processing. IF results require follow up or explanation, we will call you with instructions. Clinically stable results will be released to your [facility]. If you have not heard from [place] or cannot find your results in [facility] in 2 weeks please contact our office at [phone].  If you are not yet signed up for [facility], please consider signing up.

## 2015-12-05 LAB — HIV ANTIBODY (ROUTINE TESTING W REFLEX): HIV 1&2 Ab, 4th Generation: NONREACTIVE

## 2015-12-15 ENCOUNTER — Telehealth: Payer: Self-pay | Admitting: Family Medicine

## 2015-12-15 ENCOUNTER — Ambulatory Visit: Payer: Self-pay

## 2015-12-15 ENCOUNTER — Ambulatory Visit (INDEPENDENT_AMBULATORY_CARE_PROVIDER_SITE_OTHER): Payer: 59 | Admitting: Family Medicine

## 2015-12-15 ENCOUNTER — Encounter: Payer: Self-pay | Admitting: Family Medicine

## 2015-12-15 VITALS — BP 118/84 | HR 91 | Ht 66.0 in | Wt 209.0 lb

## 2015-12-15 DIAGNOSIS — M25551 Pain in right hip: Secondary | ICD-10-CM

## 2015-12-15 DIAGNOSIS — E669 Obesity, unspecified: Secondary | ICD-10-CM

## 2015-12-15 DIAGNOSIS — M7061 Trochanteric bursitis, right hip: Secondary | ICD-10-CM

## 2015-12-15 MED ORDER — DICLOFENAC SODIUM 2 % TD SOLN: 2.0000 "application " | Freq: Two times a day (BID) | TRANSDERMAL | 3 refills | Status: DC

## 2015-12-15 NOTE — Progress Notes (Signed)
[facility] [address], [place] [address] Phone: [phone] Subjective:    I'm seeing this patient by the request  of:  Colin Benton R., DO   CC: right hip pain  [id number]  Kayla [name] is a 45 y.o. female coming in with complaint of right hip pain.patient states that this is been a chronic problem. States that it can seem to be worse at night. When she lays on the sinus seems to give her some trouble. History of chronic low back pain. Patient denies any significant radiation down the legs. Denies any weakness or numbness. Patient continues to have worsening back pain as well. Seems to be waking her up at night being the most concerning problem. Patient states when she works out on a more regular basis and she is moving it seems to get somewhat better.     Past Medical History:  Diagnosis Date  . Anemia   . Fatigue   . Obesity   . Sleep apnea    lost weight 2013 gastric sleeve, no longer a problem  . SVD (spontaneous vaginal delivery)    x 3  . Tobacco use    Past Surgical History:  Procedure Laterality Date  . BILATERAL SALPINGECTOMY Bilateral 09/27/2013   Procedure: BILATERAL SALPINGECTOMY;  Surgeon: Lahoma Crocker, MD;  Location: [facility];  Service: Gynecology;  Laterality: Bilateral;  . CESAREAN SECTION     x 1  . LAPAROTOMY Left 09/27/2013   Procedure: EXPLORATORY LAPAROTOMY W/ EXPLORATION OF LEFT ILLIAC ARTERY & VEIN;  Surgeon: Lahoma Crocker, MD;  Location: [facility];  Service: Gynecology;  Laterality: Left;  . ROBOTIC ASSISTED TOTAL HYSTERECTOMY N/A 09/27/2013   Procedure: ROBOTIC ASSISTED TOTAL HYSTERECTOMY;  Surgeon: Lahoma Crocker, MD;  Location: [facility];  Service: Gynecology;  Laterality: N/A;  . SLEEVE GASTROPLASTY  2013   in [place]   Social History   Social History  . Marital status: Married    Spouse name: N/A  . Number of children: N/A  . Years of education: N/A   Social History Main Topics  . Smoking  status: Current Every Day Smoker    Packs/day: 1.00    Years: 25.00    Types: Cigarettes  . Smokeless tobacco: Current User     Comment: vape  . Alcohol use 0.0 oz/week     Comment: socially  . Drug use: No  . Sexual activity: Yes    Partners: Male    Birth control/ protection: None   Other Topics Concern  . 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 Morphine And Related Nausea And Vomiting   Family History  Problem Relation Age of Onset  . Hypertension Mother   . Colon cancer Maternal Grandfather 63  . Colon cancer Maternal Aunt     onset in 8s  . Stomach cancer Maternal Aunt   . Leukemia Paternal Grandmother   . Colon polyps Mother   . Colon polyps Maternal Uncle   . Colon polyps Maternal Aunt     Past medical history, social, surgical and family history all reviewed in electronic medical record.  No pertanent information unless stated regarding to the chief  complaint.   Review of Systems: No headache, visual changes, nausea, vomiting, diarrhea, constipation, dizziness, abdominal pain, skin rash, fevers, chills, night sweats, weight loss, swollen lymph nodes, body aches, joint swelling, muscle aches, chest pain, shortness of breath, mood changes.   Objective  Blood pressure 118/84, pulse 91, height 5\' 6"  (1.676 m), weight 209 lb (94.8 kg), last menstrual period 08/02/2013, SpO2 98 %.  General: No apparent distress alert and oriented x3 mood and affect normal, dressed appropriately.  HEENT: Pupils equal, extraocular movements intact  Respiratory: Patient's speak in full sentences and does not appear short of breath  Cardiovascular: No lower extremity edema, non tender, no erythema  Skin: Warm dry intact with no signs of infection or rash on  extremities or on axial skeleton.  Abdomen: Soft nontender  Neuro: Cranial nerves II through XII are intact, neurovascularly intact in all extremities with 2+ DTRs and 2+ pulses.  Lymph: No lymphadenopathy of posterior or anterior cervical chain or axillae bilaterally.  Gait normal with good balance and coordination.  MSK:  Non tender with full range of motion and good stability and symmetric strength and tone of shoulders, elbows, wrist, , knee and ankles bilaterally.  [id number] ROM IR: 15 Deg, ER: 45 Deg, Flexion: 120 Deg, Extension: 100 Deg, Abduction: 45 Deg, Adduction: 25 Deg Strength IR: 5/5, ER: 5/5, Flexion: 5/5, Extension: 5/5, Abduction: 5/5, Adduction: 5/5 Pelvic alignment unremarkable to inspection and palpation. Standing hip rotation and gait without trendelenburg sign / unsteadiness. Greater trochanter severe tenderness to palpation. Positive Faber No SI joint tenderness and normal minimal SI movement.  MSK US performed of: Right This study was ordered, performed, and interpreted by Charlann Boxer D.O.  Hip: Trochanteric bursa with significant hypoechoic changes and swelling Acetabular labrum visualized and without tears, displacement, or effusion in joint. Femoral neck appears unremarkable without increased power doppler signal along Cortex.  IMPRESSION:  Greater trochanter bursitis   Procedure: Real-time Ultrasound Guided Injection of right greater trochanteric bursitis secondary to patient's body habitus Device: GE Logiq E  Ultrasound guided injection is preferred based studies that show increased duration, increased effect, greater accuracy, decreased procedural pain, increased response rate, and decreased cost with ultrasound guided versus blind injection.  Verbal informed consent obtained.  Time-out conducted.  Noted no overlying erythema, induration, or other signs of local infection.  Skin prepped in a sterile fashion.  Local anesthesia: Topical Ethyl chloride.    With sterile technique and under real time ultrasound guidance:  Greater trochanteric area was visualized and patient's bursa was noted. A 22-gauge 3 inch needle was inserted and 4 cc of 0.5% Marcaine and 1 cc of Kenalog 40 mg/dL was injected. Pictures taken Completed without difficulty  Pain immediately resolved suggesting accurate placement of the medication.  Advised to call if fevers/chills, erythema, induration, drainage, or persistent bleeding.  Images permanently stored and available for review in the ultrasound unit.  Impression: Technically successful ultrasound guided injection.     Impression and Recommendations:     This case required medical decision making of moderate complexity.      Note: This dictation was prepared with Dragon dictation along with smaller phrase technology. Any transcriptional errors that result from this process are unintentional.

## 2015-12-15 NOTE — Assessment & Plan Note (Signed)
Encourage weight loss. 

## 2015-12-15 NOTE — Patient Instructions (Addendum)
God to see you  Ice 20 minutes 2 times daily. Usually after activity and before bed. Exercises 3 times a week.  pennsaid pinkie amount topically 2 times daily as needed.  Good shoes with rigid bottom.  Jalene Mullet, [place] or New balance greater then 700 Avoid being barefoot.  See me again in 4 weeks to make sure you are doing well.

## 2015-12-15 NOTE — Telephone Encounter (Signed)
Pt would like to have a referral to the ENT for her throat.

## 2015-12-15 NOTE — Telephone Encounter (Signed)
No referral needed to see ENT - please provide the number. Or, help her schedule appt here if she wishes. Thanks.

## 2015-12-15 NOTE — Assessment & Plan Note (Signed)
Patient given injection today and tolerated the procedure well. Topical anti-inflammatory's prescribed. We discussed icing regimen. We discussed home exercises. Given home exercises and help. Discussed which activities doing which was to avoid. Discussed proper shoes. Patient come back and see me again in 4 weeks for further evaluation and treatment. If worsening symptoms consider formal physical therapy as well as x-rays of the hip and back.

## 2015-12-15 NOTE — Telephone Encounter (Signed)
I left a message for the pt to return my call. 

## 2015-12-17 NOTE — Telephone Encounter (Signed)
I called the pt and gave her the phone numbers to [facility] and Dr Lucia Gaskins to call for an appt.

## 2016-01-07 ENCOUNTER — Telehealth: Payer: Self-pay | Admitting: *Deleted

## 2016-01-07 NOTE — Telephone Encounter (Signed)
A fax from [facility] was sent to Dr Maudie Mercury requesting orders for Lidocaine ointment, Naproxen, Vanatol, and Xyzbac to be signed for the pt.  I called [phone] and informed them Dr Maudie Mercury did not prescribe these medications for the pt and they asked that the forms be shredded and they will contact the pt.

## 2016-01-21 NOTE — Progress Notes (Deleted)
[facility] [place],  [address] Phone: [phone] Subjective:    I'm seeing this patient by the request  of:  Colin Benton R., DO   CC: right hip pain  [id number]  Kayla [name] is a 45 y.o. female coming in with complaint of right hip pain.patient states that this is been a chronic problem. States that it can seem to be worse at night. When she lays on the sinus seems to give her some trouble. History of chronic low back pain. Patient denies any significant radiation down the legs. Denies any weakness or numbness. Patient continues to have worsening back pain as well. Seems to be waking her up at night being the most concerning problem. Patient states when she works out on a more regular basis and she is moving it seems to get somewhat better.     Past Medical History:  Diagnosis Date  . Anemia   . Fatigue   . Obesity   . Sleep apnea    lost weight 2013 gastric sleeve, no longer a problem  . SVD (spontaneous vaginal delivery)    x 3  . Tobacco use    Past Surgical History:  Procedure Laterality Date  . BILATERAL SALPINGECTOMY Bilateral 09/27/2013   Procedure: BILATERAL SALPINGECTOMY;  Surgeon: Lahoma Crocker, MD;  Location: [facility];  Service: Gynecology;  Laterality: Bilateral;  . CESAREAN SECTION     x 1  . LAPAROTOMY Left 09/27/2013   Procedure: EXPLORATORY LAPAROTOMY W/ EXPLORATION OF LEFT ILLIAC ARTERY & VEIN;  Surgeon: Lahoma Crocker, MD;  Location: [facility];  Service: Gynecology;  Laterality: Left;  . ROBOTIC ASSISTED TOTAL HYSTERECTOMY N/A 09/27/2013   Procedure: ROBOTIC ASSISTED TOTAL HYSTERECTOMY;  Surgeon: Lahoma Crocker, MD;  Location: [facility];  Service: Gynecology;  Laterality: N/A;  . SLEEVE GASTROPLASTY  2013   in [place]   Social History   Social History  . Marital status: Married    Spouse name: N/A  . Number of children: N/A  . Years of education: N/A   Social History Main Topics  . Smoking  status: Current Every Day Smoker    Packs/day: 1.00    Years: 25.00    Types: Cigarettes  . Smokeless tobacco: Current User     Comment: vape  . Alcohol use 0.0 oz/week     Comment: socially  . Drug use: No  . Sexual activity: Yes    Partners: Male    Birth control/ protection: None   Other Topics Concern  . Not on file   Social History Narrative   Work or School: Producer, television/film/video, Catering manager      Home Situation: lives with husband and 34 yo sone in 2016      Spiritual Beliefs: Christian      Lifestyle: no regular exercise, poor diet         Allergies  Allergen Reactions  . Morphine And Related Nausea And Vomiting   Family History  Problem Relation Age of Onset  . Hypertension Mother   . Colon cancer Maternal Grandfather 65  . Colon cancer Maternal Aunt     onset in 20s  . Stomach cancer Maternal Aunt   . Leukemia Paternal Grandmother   . Colon polyps Mother   . Colon polyps Maternal Uncle   . Colon polyps Maternal Aunt     Past medical history, social, surgical and family history all reviewed in electronic medical record.  No pertanent information unless stated regarding to  the chief complaint.   Review of Systems: No headache, visual changes, nausea, vomiting, diarrhea, constipation, dizziness, abdominal pain, skin rash, fevers, chills, night sweats, weight loss, swollen lymph nodes, body aches, joint swelling, muscle aches, chest pain, shortness of breath, mood changes.   Objective  Last menstrual period 08/02/2013.  General: No apparent distress alert and oriented x3 mood and affect normal, dressed appropriately.  HEENT: Pupils equal, extraocular movements intact  Respiratory: Patient's speak in full sentences and does not appear short of breath  Cardiovascular: No lower extremity edema, non tender, no erythema  Skin: Warm dry intact with no signs of infection or rash on extremities or on axial skeleton.  Abdomen: Soft nontender  Neuro: Cranial nerves II  through XII are intact, neurovascularly intact in all extremities with 2+ DTRs and 2+ pulses.  Lymph: No lymphadenopathy of posterior or anterior cervical chain or axillae bilaterally.  Gait normal with good balance and coordination.  MSK:  Non tender with full range of motion and good stability and symmetric strength and tone of shoulders, elbows, wrist, , knee and ankles bilaterally.  [id number] ROM IR: 15 Deg, ER: 45 Deg, Flexion: 120 Deg, Extension: 100 Deg, Abduction: 45 Deg, Adduction: 25 Deg Strength IR: 5/5, ER: 5/5, Flexion: 5/5, Extension: 5/5, Abduction: 5/5, Adduction: 5/5 Pelvic alignment unremarkable to inspection and palpation. Standing hip rotation and gait without trendelenburg sign / unsteadiness. Greater trochanter severe tenderness to palpation. Positive Faber No SI joint tenderness and normal minimal SI movement.  MSK US performed of: Right This study was ordered, performed, and interpreted by Charlann Boxer D.O.  Hip: Trochanteric bursa with significant hypoechoic changes and swelling Acetabular labrum visualized and without tears, displacement, or effusion in joint. Femoral neck appears unremarkable without increased power doppler signal along Cortex.  IMPRESSION:  Greater trochanter bursitis   Procedure: Real-time Ultrasound Guided Injection of right greater trochanteric bursitis secondary to patient's body habitus Device: GE Logiq E  Ultrasound guided injection is preferred based studies that show increased duration, increased effect, greater accuracy, decreased procedural pain, increased response rate, and decreased cost with ultrasound guided versus blind injection.  Verbal informed consent obtained.  Time-out conducted.  Noted no overlying erythema, induration, or other signs of local infection.  Skin prepped in a sterile fashion.  Local anesthesia: Topical Ethyl chloride.  With sterile technique and under real time ultrasound guidance:  Greater trochanteric  area was visualized and patient's bursa was noted. A 22-gauge 3 inch needle was inserted and 4 cc of 0.5% Marcaine and 1 cc of Kenalog 40 mg/dL was injected. Pictures taken Completed without difficulty  Pain immediately resolved suggesting accurate placement of the medication.  Advised to call if fevers/chills, erythema, induration, drainage, or persistent bleeding.  Images permanently stored and available for review in the ultrasound unit.  Impression: Technically successful ultrasound guided injection.     Impression and Recommendations:     This case required medical decision making of moderate complexity.      Note: This dictation was prepared with Dragon dictation along with smaller phrase technology. Any transcriptional errors that result from this process are unintentional.

## 2016-01-22 ENCOUNTER — Ambulatory Visit: Payer: 59 | Admitting: Family Medicine

## 2016-02-19 ENCOUNTER — Ambulatory Visit (INDEPENDENT_AMBULATORY_CARE_PROVIDER_SITE_OTHER): Payer: 59 | Admitting: Family Medicine

## 2016-02-19 ENCOUNTER — Encounter: Payer: Self-pay | Admitting: Family Medicine

## 2016-02-19 VITALS — BP 132/90 | HR 81 | Temp 98.0°F | Wt 207.7 lb

## 2016-02-19 DIAGNOSIS — N390 Urinary tract infection, site not specified: Secondary | ICD-10-CM

## 2016-02-19 DIAGNOSIS — R300 Dysuria: Secondary | ICD-10-CM | POA: Diagnosis not present

## 2016-02-19 DIAGNOSIS — Z6833 Body mass index (BMI) 33.0-33.9, adult: Secondary | ICD-10-CM

## 2016-02-19 DIAGNOSIS — M25559 Pain in unspecified hip: Secondary | ICD-10-CM

## 2016-02-19 DIAGNOSIS — R030 Elevated blood-pressure reading, without diagnosis of hypertension: Secondary | ICD-10-CM | POA: Diagnosis not present

## 2016-02-19 DIAGNOSIS — F17200 Nicotine dependence, unspecified, uncomplicated: Secondary | ICD-10-CM | POA: Diagnosis not present

## 2016-02-19 LAB — POCT URINALYSIS DIPSTICK
BILIRUBIN UA: NEGATIVE
Glucose, UA: NEGATIVE
KETONES UA: NEGATIVE
Nitrite, UA: POSITIVE
PH UA: 7
Protein, UA: NEGATIVE
Spec Grav, UA: 1.02
UROBILINOGEN UA: 0.2

## 2016-02-19 MED ORDER — NITROFURANTOIN MONOHYD MACRO 100 MG PO CAPS: 100.0000 mg | ORAL_CAPSULE | Freq: Two times a day (BID) | ORAL | 0 refills | Status: DC

## 2016-02-19 NOTE — Progress Notes (Signed)
HPI:  Kayla [name] is a pleasant 45 year old here for an acute visit for dysuria. This started a few weeks ago. Symptoms include urgency and frequency. She denies fevers, malaise, flank pain, hematuria, nausea, vomiting or vaginal symptoms. Blood pressure is little elevated today, better on recheck. He thinks this is due to stress and has physical scheduled later this month and wishes to repeat then. She continues to smoke lifestyle is poor. She has persistent hip pain. She sees Dr. Tamala Julian for this. She does not feel like treatment so far worked, but she has not made follow-up. Denies worsening or change in symptoms.  ROS: See pertinent positives and negatives per HPI.  Past Medical History:  Diagnosis Date  . Anemia   . Fatigue   . Obesity   . Sleep apnea    lost weight 2013 gastric sleeve, no longer a problem  . SVD (spontaneous vaginal delivery)    x 3  . Tobacco use     Past Surgical History:  Procedure Laterality Date  . BILATERAL SALPINGECTOMY Bilateral 09/27/2013   Procedure: BILATERAL SALPINGECTOMY;  Surgeon: Lahoma Crocker, MD;  Location: [facility];  Service: Gynecology;  Laterality: Bilateral;  . CESAREAN SECTION     x 1  . LAPAROTOMY Left 09/27/2013   Procedure: EXPLORATORY LAPAROTOMY W/ EXPLORATION OF LEFT ILLIAC ARTERY & VEIN;  Surgeon: Lahoma Crocker, MD;  Location: [facility];  Service: Gynecology;  Laterality: Left;  . ROBOTIC ASSISTED TOTAL HYSTERECTOMY N/A 09/27/2013   Procedure: ROBOTIC ASSISTED TOTAL HYSTERECTOMY;  Surgeon: Lahoma Crocker, MD;  Location: [facility];  Service: Gynecology;  Laterality: N/A;  . SLEEVE GASTROPLASTY  2013   in [place]    Family History  Problem Relation Age of Onset  . Hypertension Mother   . Colon cancer Maternal Grandfather 53  . Colon cancer Maternal Aunt     onset in 73s  . Stomach cancer Maternal Aunt   . Leukemia Paternal Grandmother   . Colon polyps Mother   . Colon polyps Maternal Uncle   . Colon polyps Maternal Aunt      Social History   Social History  . Marital status: Married    Spouse name: N/A  . Number of children: N/A  . Years of education: N/A   Social History Main Topics  . Smoking status: Current Every Day Smoker    Packs/day: 1.00    Years: 25.00    Types: Cigarettes  . Smokeless tobacco: Current User     Comment: vape  . Alcohol use 0.0 oz/week     Comment: socially  . Drug use: No  . Sexual activity: Yes    Partners: Male    Birth control/ protection: None   Other Topics Concern  . None   Social History Narrative   Work or School: Producer, television/film/video, Armed forces technical officer Situation: lives with husband and 68 yo [place] in 2016      Spiritual Beliefs: Christian      Lifestyle: no regular exercise, poor diet           Current Outpatient Prescriptions:  .  BIOTIN PO, Take by mouth., Disp: , Rfl:  .  buPROPion (WELLBUTRIN SR) 150 MG 12 hr tablet, Take 1 tablet (150 mg total) by mouth daily., Disp: 90 tablet, Rfl: 1 .  ibuprofen (ADVIL,MOTRIN) 200 MG tablet, Take 200 mg by mouth every 6 (six) hours as needed., Disp: , Rfl:  .  Multiple Vitamin (MULTIVITAMIN WITH MINERALS) TABS tablet, Take 1 tablet  by mouth daily., Disp: , Rfl:  .  NON FORMULARY, , Disp: , Rfl:  .  traZODone (DESYREL) 50 MG tablet, Take 25 mg by mouth at bedtime as needed for sleep., Disp: , Rfl:  .  Diclofenac Sodium (PENNSAID) 2 % SOLN, Place 2 application onto the skin 2 (two) times daily. (Patient not taking: Reported on 02/19/2016), Disp: 112 g, Rfl: 3 .  nitrofurantoin, macrocrystal-monohydrate, (MACROBID) 100 MG capsule, Take 1 capsule (100 mg total) by mouth 2 (two) times daily., Disp: 14 capsule, Rfl: 0 .  triamcinolone ointment (KENALOG) 0.1 %, Apply 1 application topically 2 (two) times daily. (Patient not taking: Reported on 02/19/2016), Disp: 30 g, Rfl: 0 .  varenicline (CHANTIX CONTINUING MONTH PAK) 1 MG tablet, Take 1 tablet (1 mg total) by mouth 2 (two) times daily. (Patient not taking: Reported  on 02/19/2016), Disp: 120 tablet, Rfl: 0 .  varenicline (CHANTIX STARTING MONTH PAK) 0.5 MG X 11 & 1 MG X 42 tablet, Take one 0.5 mg tablet by mouth once daily for 3 days, then increase to one 0.5 mg tablet twice daily for 4 days, then increase to one 1 mg tablet twice daily. (Patient not taking: Reported on 02/19/2016), Disp: 53 tablet, Rfl: 0 .  vitamin B-12 (CYANOCOBALAMIN) 100 MCG tablet, Take 100 mcg by mouth daily., Disp: , Rfl:   EXAM:  Vitals:   02/19/16 1551  BP: 132/90  Pulse: 81  Temp: 98 F (36.7 C)    Body mass index is 33.52 kg/m.  GENERAL: vitals reviewed and listed above, alert, oriented, appears well hydrated and in no acute distress  HEENT: atraumatic, conjunttiva clear, no obvious abnormalities on inspection of external nose and ears  NECK: no obvious masses on inspection  LUNGS: clear to auscultation bilaterally, no wheezes, rales or rhonchi, good air movement  CV: HRRR, no peripheral edema  ABD: BS+, soft, NTTP, no CVA TTP  MS: moves all extremities without noticeable abnormality  PSYCH: pleasant and cooperative, no obvious depression or anxiety  ASSESSMENT AND PLAN:  Discussed the following assessment and plan:  Dysuria - Plan: POCT urinalysis dipstick  Transient elevated blood pressure  BMI 33.0-33.9,adult  Tobacco use disorder  Arthralgia of hip, unspecified laterality  -U dip consistent with likely UTI, will treat with Macrobid after discussion risks and benefits -She wants to see a urologist that she has had several UTIs; we will get culture to confirm infection, then placed referral if she still wishes to do that.  -Advised to quit smoking and advised healthy lifestyle -Advised follow up with Dr. Tamala Julian about her hip -Follow up for physical later this month plan and to recheck blood pressure then, advised will need treatment if persistently elevated -Patient advised to return or notify a doctor immediately if symptoms worsen or persist or  new concerns arise.  Patient Instructions  BEFORE YOU LEAVE: -follow up: keep appointment later this month as scheduled  Take the antibiotic for the UTI. Let us know if symptoms persist.  Follow up with Dr. Tamala Julian about the hip pain.  Please quit smoking.  We recommend the following healthy lifestyle for LIFE: 1) Small portions.   Tip: eat off of a salad plate instead of a dinner plate.  Tip: if you need more or a snack choose fruits, veggies and/or a handful of nuts or seeds.  2) Eat a healthy clean diet.  * Tip: Avoid (less then 1 serving per week): processed foods, sweets, sweetened drinks, white starches (rice, flour, bread, potatoes,  pasta, etc), red meat, fast foods, butter  *Tip: CHOOSE instead   * 5-9 servings per day of fresh or frozen fruits and vegetables (but not corn, potatoes, bananas, canned or dried fruit)   *nuts and seeds, beans   *olives and olive oil   *small portions of lean meats such as fish and white chicken    *small portions of whole grains  3)Get at least 150 minutes of sweaty aerobic exercise per week.  4)Reduce stress - consider counseling, meditation and relaxation to balance other aspects of your life.     Colin Benton R., DO

## 2016-02-19 NOTE — Patient Instructions (Signed)
BEFORE YOU LEAVE: -follow up: keep appointment later this month as scheduled  Take the antibiotic for the UTI. Let us know if symptoms persist.  Follow up with Dr. Tamala Julian about the hip pain.  Please quit smoking.  We recommend the following healthy lifestyle for LIFE: 1) Small portions.   Tip: eat off of a salad plate instead of a dinner plate.  Tip: if you need more or a snack choose fruits, veggies and/or a handful of nuts or seeds.  2) Eat a healthy clean diet.  * Tip: Avoid (less then 1 serving per week): processed foods, sweets, sweetened drinks, white starches (rice, flour, bread, potatoes, pasta, etc), red meat, fast foods, butter  *Tip: CHOOSE instead   * 5-9 servings per day of fresh or frozen fruits and vegetables (but not corn, potatoes, bananas, canned or dried fruit)   *nuts and seeds, beans   *olives and olive oil   *small portions of lean meats such as fish and white chicken    *small portions of whole grains  3)Get at least 150 minutes of sweaty aerobic exercise per week.  4)Reduce stress - consider counseling, meditation and relaxation to balance other aspects of your life.

## 2016-02-19 NOTE — Progress Notes (Signed)
Pre visit review using our clinic review tool, if applicable. No additional management support is needed unless otherwise documented below in the visit note. 

## 2016-02-22 LAB — URINE CULTURE

## 2016-03-01 NOTE — Addendum Note (Signed)
Addended by: Agnes Lawrence on: 03/01/2016 04:02 PM   Modules accepted: Orders

## 2016-03-04 ENCOUNTER — Ambulatory Visit: Payer: 59 | Admitting: Family Medicine

## 2016-03-04 ENCOUNTER — Telehealth: Payer: Self-pay | Admitting: *Deleted

## 2016-03-04 DIAGNOSIS — Z0289 Encounter for other administrative examinations: Secondary | ICD-10-CM

## 2016-03-04 NOTE — Progress Notes (Deleted)
HPI:  Follow up:  Tobacco use: -wellbutrin started 12/04/15 -reports  ROS: See pertinent positives and negatives per HPI.  Past Medical History:  Diagnosis Date  . Anemia   . Fatigue   . Obesity   . Sleep apnea    lost weight 2013 gastric sleeve, no longer a problem  . SVD (spontaneous vaginal delivery)    x 3  . Tobacco use     Past Surgical History:  Procedure Laterality Date  . BILATERAL SALPINGECTOMY Bilateral 09/27/2013   Procedure: BILATERAL SALPINGECTOMY;  Surgeon: Lahoma Crocker, MD;  Location: [facility];  Service: Gynecology;  Laterality: Bilateral;  . CESAREAN SECTION     x 1  . LAPAROTOMY Left 09/27/2013   Procedure: EXPLORATORY LAPAROTOMY W/ EXPLORATION OF LEFT ILLIAC ARTERY & VEIN;  Surgeon: Lahoma Crocker, MD;  Location: [facility];  Service: Gynecology;  Laterality: Left;  . ROBOTIC ASSISTED TOTAL HYSTERECTOMY N/A 09/27/2013   Procedure: ROBOTIC ASSISTED TOTAL HYSTERECTOMY;  Surgeon: Lahoma Crocker, MD;  Location: [facility];  Service: Gynecology;  Laterality: N/A;  . SLEEVE GASTROPLASTY  2013   in [place]    Family History  Problem Relation Age of Onset  . Hypertension Mother   . Colon cancer Maternal Grandfather 35  . Colon cancer Maternal Aunt     onset in 83s  . Stomach cancer Maternal Aunt   . Leukemia Paternal Grandmother   . Colon polyps Mother   . Colon polyps Maternal Uncle   . Colon polyps Maternal Aunt     Social History   Social History  . Marital status: Married    Spouse name: N/A  . Number of children: N/A  . Years of education: N/A   Social History Main Topics  . Smoking status: Current Every Day Smoker    Packs/day: 1.00    Years: 25.00    Types: Cigarettes  . Smokeless tobacco: Current User     Comment: vape  . Alcohol use 0.0 oz/week     Comment: socially  . Drug use: No  . Sexual activity: Yes    Partners: Male    Birth control/ protection: None   Other Topics Concern  . Not on file   Social History Narrative   Work or School: Producer, television/film/video, Catering manager      Home Situation: lives with husband and 42 yo sone in 2016      Spiritual Beliefs: Christian      Lifestyle: no regular exercise, poor diet           Current Outpatient Prescriptions:  .  BIOTIN PO, Take by mouth., Disp: , Rfl:  .  buPROPion (WELLBUTRIN SR) 150 MG 12 hr tablet, Take 1 tablet (150 mg total) by mouth daily., Disp: 90 tablet, Rfl: 1 .  Diclofenac Sodium (PENNSAID) 2 % SOLN, Place 2 application onto the skin 2 (two) times daily. (Patient not taking: Reported on 02/19/2016), Disp: 112 g, Rfl: 3 .  ibuprofen (ADVIL,MOTRIN) 200 MG tablet, Take 200 mg by mouth every 6 (six) hours as needed., Disp: , Rfl:  .  Multiple Vitamin (MULTIVITAMIN WITH MINERALS) TABS tablet, Take 1 tablet by mouth daily., Disp: , Rfl:  .  nitrofurantoin, macrocrystal-monohydrate, (MACROBID) 100 MG capsule, Take 1 capsule (100 mg total) by mouth 2 (two) times daily., Disp: 14 capsule, Rfl: 0 .  NON FORMULARY, , Disp: , Rfl:  .  traZODone (DESYREL) 50 MG tablet, Take 25 mg by mouth at bedtime as needed for sleep., Disp: , Rfl:  .  triamcinolone ointment (KENALOG) 0.1 %, Apply 1 application topically 2 (two) times daily. (Patient not taking: Reported on 02/19/2016), Disp: 30 g, Rfl: 0 .  varenicline (CHANTIX CONTINUING MONTH PAK) 1 MG tablet, Take 1 tablet (1 mg total) by mouth 2 (two) times daily. (Patient not taking: Reported on 02/19/2016), Disp: 120 tablet, Rfl: 0 .  varenicline (CHANTIX STARTING MONTH PAK) 0.5 MG X 11 & 1 MG X 42 tablet, Take one 0.5 mg tablet by mouth once daily for 3 days, then increase to one 0.5 mg tablet twice daily for 4 days, then increase to one 1 mg tablet twice daily. (Patient not taking: Reported on 02/19/2016), Disp: 53 tablet, Rfl: 0 .  vitamin B-12 (CYANOCOBALAMIN) 100 MCG tablet, Take 100 mcg by mouth daily., Disp: , Rfl:   EXAM:  There were no vitals filed for this visit.  There is no height or weight on file to calculate  BMI.  GENERAL: vitals reviewed and listed above, alert, oriented, appears well hydrated and in no acute distress  HEENT: atraumatic, conjunttiva clear, no obvious abnormalities on inspection of external nose and ears  NECK: no obvious masses on inspection  LUNGS: clear to auscultation bilaterally, no wheezes, rales or rhonchi, good air movement  CV: HRRR, no peripheral edema  MS: moves all extremities without noticeable abnormality  PSYCH: pleasant and cooperative, no obvious depression or anxiety  ASSESSMENT AND PLAN:  Discussed the following assessment and plan:  No diagnosis found.  -Patient advised to return or notify a doctor immediately if symptoms worsen or persist or new concerns arise.  There are no Patient Instructions on file for this visit.  Colin Benton R., DO

## 2016-03-04 NOTE — Telephone Encounter (Signed)
Patient was a no-show for today's appt.  I left a detailed message to check on the pt and asked that she call back to schedule an appt.

## 2016-06-09 DIAGNOSIS — N393 Stress incontinence (female) (male): Secondary | ICD-10-CM | POA: Diagnosis not present

## 2016-06-09 DIAGNOSIS — R350 Frequency of micturition: Secondary | ICD-10-CM | POA: Diagnosis not present

## 2016-06-09 DIAGNOSIS — N390 Urinary tract infection, site not specified: Secondary | ICD-10-CM | POA: Diagnosis not present

## 2016-06-09 MED FILL — CIPROFLOXACIN HCL 250 MG TA: 250 | 7 days supply | Qty: 14 | Fill #0

## 2016-10-15 ENCOUNTER — Emergency Department (HOSPITAL_COMMUNITY)
Admission: EM | Admit: 2016-10-15 | Discharge: 2016-10-16 | Disposition: A | Discharge status: Home / Self Care | Payer: 59 | Attending: Emergency Medicine | Admitting: Emergency Medicine

## 2016-10-15 ENCOUNTER — Encounter (HOSPITAL_COMMUNITY): Payer: Self-pay | Admitting: Emergency Medicine

## 2016-10-15 ENCOUNTER — Emergency Department (HOSPITAL_COMMUNITY): Payer: 59

## 2016-10-15 DIAGNOSIS — S6992XA Unspecified injury of left wrist, hand and finger(s), initial encounter: Secondary | ICD-10-CM | POA: Diagnosis present

## 2016-10-15 DIAGNOSIS — M2011 Hallux valgus (acquired), right foot: Secondary | ICD-10-CM | POA: Diagnosis not present

## 2016-10-15 DIAGNOSIS — Y92410 Unspecified street and highway as the place of occurrence of the external cause: Secondary | ICD-10-CM | POA: Diagnosis not present

## 2016-10-15 DIAGNOSIS — F17210 Nicotine dependence, cigarettes, uncomplicated: Secondary | ICD-10-CM | POA: Insufficient documentation

## 2016-10-15 DIAGNOSIS — Y999 Unspecified external cause status: Secondary | ICD-10-CM | POA: Insufficient documentation

## 2016-10-15 DIAGNOSIS — S62337A Displaced fracture of neck of fifth metacarpal bone, left hand, initial encounter for closed fracture: Secondary | ICD-10-CM | POA: Insufficient documentation

## 2016-10-15 DIAGNOSIS — M25552 Pain in left hip: Secondary | ICD-10-CM | POA: Diagnosis not present

## 2016-10-15 DIAGNOSIS — Y939 Activity, unspecified: Secondary | ICD-10-CM | POA: Diagnosis not present

## 2016-10-15 DIAGNOSIS — S79912A Unspecified injury of left hip, initial encounter: Secondary | ICD-10-CM | POA: Diagnosis not present

## 2016-10-15 DIAGNOSIS — M79671 Pain in right foot: Secondary | ICD-10-CM | POA: Insufficient documentation

## 2016-10-15 DIAGNOSIS — M79642 Pain in left hand: Secondary | ICD-10-CM | POA: Diagnosis not present

## 2016-10-15 MED ORDER — IBUPROFEN 600 MG PO TABS: 600.0000 mg | ORAL_TABLET | Freq: Four times a day (QID) | ORAL | 0 refills | Status: DC | PRN

## 2016-10-15 MED ORDER — OXYCODONE-ACETAMINOPHEN 5-325 MG PO TABS: 1.0000 | ORAL_TABLET | ORAL | 0 refills | Status: DC | PRN

## 2016-10-15 MED ORDER — ONDANSETRON HCL 4 MG/2ML IJ SOLN
4.0000 mg | Freq: Once | INTRAMUSCULAR | Status: AC
  Administered 2016-10-15: 4 mg via INTRAVENOUS
  Filled 2016-10-15: qty 2

## 2016-10-15 MED ORDER — OXYCODONE-ACETAMINOPHEN 5-325 MG PO TABS
1.0000 | ORAL_TABLET | Freq: Once | ORAL | Status: AC
  Administered 2016-10-15: 1 via ORAL
  Filled 2016-10-15: qty 1

## 2016-10-15 MED ORDER — METHOCARBAMOL 500 MG PO TABS: 500.0000 mg | ORAL_TABLET | Freq: Two times a day (BID) | ORAL | 0 refills | Status: DC

## 2016-10-15 MED ORDER — FENTANYL CITRATE (PF) 100 MCG/2ML IJ SOLN
50.0000 ug | INTRAMUSCULAR | Status: DC | PRN
  Administered 2016-10-15: 50 ug via INTRAVENOUS
  Filled 2016-10-15: qty 2

## 2016-10-15 NOTE — Discharge Instructions (Signed)
Take your medications as prescribed. I also recommend applying ice and/or heat to affected area for 15-20 minutes 3-4 times daily for additional pain relief. Refrain from doing any heavy lifting, squatting or repetitive movements that exacerbate your symptoms. Follow-up with your primary care provider in the next week if her symptoms have not improved.  Please return to the Emergency Department if symptoms worsen or new onset of fever, numbness, tingling, groin anesthesia, loss of bowel or bladder, weakness.

## 2016-10-15 NOTE — ED Notes (Signed)
Returned from X-Ray.
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
## 2016-10-15 NOTE — ED Notes (Signed)
Patient transported to X-ray 
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
## 2016-10-15 NOTE — ED Notes (Signed)
ED Provider at bedside. 

## 2016-10-16 DIAGNOSIS — S62337A Displaced fracture of neck of fifth metacarpal bone, left hand, initial encounter for closed fracture: Secondary | ICD-10-CM | POA: Diagnosis not present

## 2016-10-16 DIAGNOSIS — M79671 Pain in right foot: Secondary | ICD-10-CM | POA: Diagnosis not present

## 2016-10-16 DIAGNOSIS — F17210 Nicotine dependence, cigarettes, uncomplicated: Secondary | ICD-10-CM | POA: Diagnosis not present

## 2016-10-16 DIAGNOSIS — M25552 Pain in left hip: Secondary | ICD-10-CM | POA: Diagnosis not present

## 2016-10-17 DIAGNOSIS — M79642 Pain in left hand: Secondary | ICD-10-CM | POA: Diagnosis not present

## 2016-10-17 DIAGNOSIS — S62337A Displaced fracture of neck of fifth metacarpal bone, left hand, initial encounter for closed fracture: Secondary | ICD-10-CM | POA: Diagnosis not present

## 2016-10-20 DIAGNOSIS — N3941 Urge incontinence: Secondary | ICD-10-CM | POA: Diagnosis not present

## 2016-10-20 DIAGNOSIS — N390 Urinary tract infection, site not specified: Secondary | ICD-10-CM | POA: Diagnosis not present

## 2016-10-20 DIAGNOSIS — R350 Frequency of micturition: Secondary | ICD-10-CM | POA: Diagnosis not present

## 2016-10-20 MED FILL — TRIMETHOPRIM 100 MG TABLET: 100 | 30 days supply | Qty: 30 | Fill #0

## 2016-10-26 ENCOUNTER — Encounter: Payer: Self-pay | Admitting: Family Medicine

## 2016-11-03 DIAGNOSIS — S62337A Displaced fracture of neck of fifth metacarpal bone, left hand, initial encounter for closed fracture: Secondary | ICD-10-CM | POA: Diagnosis not present

## 2016-11-07 DIAGNOSIS — S62307A Unspecified fracture of fifth metacarpal bone, left hand, initial encounter for closed fracture: Secondary | ICD-10-CM | POA: Diagnosis not present

## 2016-11-07 DIAGNOSIS — S62397A Other fracture of fifth metacarpal bone, left hand, initial encounter for closed fracture: Secondary | ICD-10-CM | POA: Diagnosis not present

## 2016-11-07 MED FILL — OXYCODONE W/APAP 5/325 TAB: 5-325 | 3 days supply | Qty: 20 | Fill #0

## 2016-11-10 DIAGNOSIS — S62337D Displaced fracture of neck of fifth metacarpal bone, left hand, subsequent encounter for fracture with routine healing: Secondary | ICD-10-CM | POA: Diagnosis not present

## 2016-11-10 MED FILL — HYDROCODON-APAP 5-325: 5-325 | 2 days supply | Qty: 20 | Fill #0

## 2016-11-21 ENCOUNTER — Ambulatory Visit (INDEPENDENT_AMBULATORY_CARE_PROVIDER_SITE_OTHER): Payer: 59 | Admitting: Family Medicine

## 2016-11-21 ENCOUNTER — Encounter: Payer: Self-pay | Admitting: Family Medicine

## 2016-11-21 VITALS — BP 118/84 | HR 90 | Temp 97.6°F | Ht 66.0 in | Wt 222.7 lb

## 2016-11-21 DIAGNOSIS — L309 Dermatitis, unspecified: Secondary | ICD-10-CM | POA: Diagnosis not present

## 2016-11-21 DIAGNOSIS — F439 Reaction to severe stress, unspecified: Secondary | ICD-10-CM

## 2016-11-21 DIAGNOSIS — F17200 Nicotine dependence, unspecified, uncomplicated: Secondary | ICD-10-CM

## 2016-11-21 MED ORDER — TRIAMCINOLONE ACETONIDE 0.5 % EX OINT: 1.0000 "application " | TOPICAL_OINTMENT | Freq: Two times a day (BID) | CUTANEOUS | 0 refills | Status: DC

## 2016-11-21 MED ORDER — VARENICLINE TARTRATE 1 MG PO TABS: 1.0000 mg | ORAL_TABLET | Freq: Two times a day (BID) | ORAL | 0 refills | Status: DC

## 2016-11-21 MED ORDER — VARENICLINE TARTRATE 0.5 MG X 11 & 1 MG X 42 PO MISC: ORAL | 0 refills | Status: DC

## 2016-11-21 MED FILL — TRIAMCINOLONE 0.5% OINTMENT: 0.5 | 7 days supply | Qty: 30 | Fill #0

## 2016-11-21 MED FILL — CHANTIX 1 MG CONT MONTH BOX: 1 | 28 days supply | Qty: 56 | Fill #0
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
## 2016-11-21 NOTE — Patient Instructions (Signed)
BEFORE YOU LEAVE: -follow up: CPE in 3 months  Start the chantix - follow starter pack instructions. Quit smoking!  For the hands - wear non latex gloves for washing, cleaning, soil, etc. Apply Aquaphor several times daily to protect. Apply the steroid ointment twice daily for 2 weeks. Dermatology evaluation if not improving.

## 2016-11-22 MED FILL — HYDROCODON-APAP 5-325: 5-325 | 8 days supply | Qty: 30 | Fill #0

## 2016-12-01 DIAGNOSIS — S62337D Displaced fracture of neck of fifth metacarpal bone, left hand, subsequent encounter for fracture with routine healing: Secondary | ICD-10-CM | POA: Diagnosis not present

## 2016-12-06 MED FILL — traMADol HCL 50 MG TABS: 50 | 8 days supply | Qty: 30 | Fill #0

## 2016-12-22 DIAGNOSIS — S62337D Displaced fracture of neck of fifth metacarpal bone, left hand, subsequent encounter for fracture with routine healing: Secondary | ICD-10-CM | POA: Diagnosis not present

## 2017-01-12 DIAGNOSIS — S62337D Displaced fracture of neck of fifth metacarpal bone, left hand, subsequent encounter for fracture with routine healing: Secondary | ICD-10-CM | POA: Diagnosis not present

## 2017-01-12 MED FILL — traMADol HCL 50 MG TABS: 50 | 7 days supply | Qty: 30 | Fill #0

## 2017-01-27 DIAGNOSIS — H5213 Myopia, bilateral: Secondary | ICD-10-CM | POA: Diagnosis not present

## 2017-01-30 DIAGNOSIS — S62337D Displaced fracture of neck of fifth metacarpal bone, left hand, subsequent encounter for fracture with routine healing: Secondary | ICD-10-CM | POA: Diagnosis not present

## 2017-03-06 NOTE — Progress Notes (Signed)
HPI:  Kayla [name] is a pleasant 46 year old with a past medical history of obesity, sleep apnea, status post gastric sleeve, and smoking here for follow-up. She would like to quit smoking.She has tried several medications for her smoking in the past including Chantix and Wellbutrin. She did not like the Chantix as it gave her bad dreams. She felt like the Wellbutrin really helped when she used it in the past. She is motivated to quit because she has had a chronic cough, sometimes with yellow sputum. She reports this cough has been going on for over a month. Denies hemoptysis, fevers, malaise. She does have chronic dyspnea. Unfortunately she reports her health has not been great lately. She has regained almost all of her weight that she lost with her gastric bypass surgery. She admits that her diet is poor and she gets no regular exercise. She would like to start getting on a healthier course.She has chronic knee and hip pain. Reports she has seen orthopedic specialist in the past and was told she has osteoarthritis. Her blood pressure was elevated on arrival and remained somewhat elevated on recheck. She does not want to start a medication for this. She reports she has never had trouble with her blood pressure and feels this is related to stress. She thinks this will get better.no chest pain, headaches or dizziness. Due for flu shot, preventive health exam and mammogram.  ROS: See pertinent positives and negatives per HPI.  Past Medical History:  Diagnosis Date  . Anemia   . Fatigue   . Obesity   . Sleep apnea    lost weight 2013 gastric sleeve, no longer a problem  . SVD (spontaneous vaginal delivery)    x 3  . Tobacco use     Past Surgical History:  Procedure Laterality Date  . BILATERAL SALPINGECTOMY Bilateral 09/27/2013   Procedure: BILATERAL SALPINGECTOMY;  Surgeon: Lahoma Crocker, MD;  Location: [facility];  Service: Gynecology;  Laterality: Bilateral;  . CESAREAN SECTION     x 1  . LAPAROTOMY Left 09/27/2013   Procedure: EXPLORATORY LAPAROTOMY W/ EXPLORATION OF LEFT ILLIAC ARTERY & VEIN;  Surgeon: Lahoma Crocker, MD;  Location: [facility];  Service: Gynecology;  Laterality: Left;  . ROBOTIC ASSISTED TOTAL HYSTERECTOMY N/A 09/27/2013   Procedure: ROBOTIC ASSISTED TOTAL HYSTERECTOMY;  Surgeon: Lahoma Crocker, MD;  Location: [facility];  Service: Gynecology;  Laterality: N/A;  . SLEEVE GASTROPLASTY  2013   in [place]    Family History  Problem Relation Age of Onset  . Hypertension Mother   . Colon polyps Mother   . Colon cancer Maternal Grandfather 69  . Colon cancer Maternal Aunt        onset in 67s  . Stomach cancer Maternal Aunt   . Leukemia Paternal Grandmother   . Colon polyps Maternal Uncle   . Colon polyps Maternal Aunt     Social History   Social History  . Marital status: Married    Spouse name: N/A  . Number of children: N/A  . Years of education: N/A   Social History Main Topics  . Smoking status: Current Every Day Smoker    Packs/day: 1.00    Years: 25.00    Types: Cigarettes  . Smokeless tobacco: Current User     Comment: vape  . Alcohol use 0.0 oz/week     Comment: socially  . Drug use: No  . Sexual activity: Yes    Partners: Male    Birth control/ protection: None  Other Topics Concern  . 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  BIOTIN PO, Take by mouth., Disp: , Rfl:  .  Multiple Vitamin (MULTIVITAMIN WITH MINERALS) TABS tablet, Take 1 tablet by mouth daily., Disp: , Rfl:  .  NON FORMULARY, , Disp: , Rfl:  .  traZODone (DESYREL) 50 MG tablet, Take 25 mg by mouth at bedtime as needed for sleep., Disp: , Rfl:  .  triamcinolone ointment (KENALOG) 0.5 %, Apply 1 application topically 2 (two)  times daily., Disp: 30 g, Rfl: 0 .  trimethoprim (TRIMPEX) 100 MG tablet, Take 100 mg by mouth daily. Recurrent Bladder Infection, Disp: , Rfl:  .  vitamin B-12 (CYANOCOBALAMIN) 100 MCG tablet, Take 100 mcg by mouth daily., Disp: , Rfl:  .  buPROPion (WELLBUTRIN SR) 150 MG 12 hr tablet, Take 1 tablet (150 mg total) by mouth daily., Disp: 90 tablet, Rfl: 0  EXAM:  Vitals:   03/07/17 0954 03/07/17 1005  BP: 136/90 136/90  Pulse: 87   Temp: (!) 97.4 F (36.3 C)     Body mass index is 37.77 kg/m.  GENERAL: vitals reviewed and listed above, alert, oriented, appears well hydrated and in no acute distress  HEENT: atraumatic, conjunttiva clear, no obvious abnormalities on inspection of external nose and ears  NECK: no obvious masses on inspection  LUNGS: clear to auscultation bilaterally, no wheezes, rales or rhonchi, good air movement  CV: HRRR, no peripheral edema  MS: moves all extremities without noticeable abnormality  PSYCH: pleasant and cooperative, no obvious depression or anxiety  ASSESSMENT AND PLAN:  Discussed the following assessment and plan:  Tobacco use disorder - Plan: DG Chest 2 View -counseled for at least 5 minutes\advised to quit -advised the risk of continued smoking -Discussed motivation, options to assist her with cessation, medications including risk and benefits -She wants to try Wellbutrin, Rx provided  Cough  - Plan: DG Chest 2 View -Follow-up one month -Advised to quit smoking  Morbid obesity ([place]) - Plan: Hemoglobin A1c, HDL cholesterol, Cholesterol, total Elevated blood pressure reading - Plan: Basic metabolic panel, CBC -labs -Lifestyle recommendations discussed at length -Follow up 1 month, advised we would strongly advised her to start a blood pressure medication if her blood pressure remains elevated  Osteoarthritis, unspecified osteoarthritis type, unspecified site -she plans to follow up with her orthopedic specialist  -Patient  advised to return or notify a doctor immediately if symptoms worsen or persist or new concerns arise.  Patient Instructions  BEFORE YOU LEAVE: -flu shot if she wishes -labs -xray sheet -follow up: 1 month  Go get the chest x-ray.  Start the Wellbutrin today. Quit smoking in 1 week.  Start getting some regular gentle exercise outside on a regular basis. Please try to eat healthy, try to get at least 5-7 servings of colorful vegetables per day. Avoid processed foods, red meat, fried foods and sweets.  We have ordered labs or studies at this visit. It can take up to 1-2 weeks for results and processing. IF results require follow up or explanation, we will call you with instructions. Clinically stable results will be released to your [facility]. If you have not  heard from [place] or cannot find your results in [facility] in 2 weeks please contact our office at [phone].  If you are not yet signed up for [facility], please consider signing up.  Procedure orthopedic specialist about her knee and hip issues.        Colin Benton R., DO

## 2017-03-07 ENCOUNTER — Other Ambulatory Visit: Payer: Self-pay | Admitting: *Deleted

## 2017-03-07 ENCOUNTER — Encounter: Payer: Self-pay | Admitting: Family Medicine

## 2017-03-07 ENCOUNTER — Telehealth: Payer: Self-pay | Admitting: Family Medicine

## 2017-03-07 ENCOUNTER — Ambulatory Visit (INDEPENDENT_AMBULATORY_CARE_PROVIDER_SITE_OTHER)
Admission: RE | Admit: 2017-03-07 | Discharge: 2017-03-07 | Disposition: A | Discharge status: Home / Self Care | Payer: 59 | Source: Ambulatory Visit | Attending: Family Medicine | Admitting: Family Medicine

## 2017-03-07 ENCOUNTER — Ambulatory Visit (INDEPENDENT_AMBULATORY_CARE_PROVIDER_SITE_OTHER): Payer: 59 | Admitting: Family Medicine

## 2017-03-07 VITALS — BP 136/90 | HR 87 | Temp 97.4°F | Ht 66.0 in | Wt 234.0 lb

## 2017-03-07 DIAGNOSIS — R05 Cough: Secondary | ICD-10-CM | POA: Diagnosis not present

## 2017-03-07 DIAGNOSIS — R030 Elevated blood-pressure reading, without diagnosis of hypertension: Secondary | ICD-10-CM | POA: Diagnosis not present

## 2017-03-07 DIAGNOSIS — F17200 Nicotine dependence, unspecified, uncomplicated: Secondary | ICD-10-CM

## 2017-03-07 DIAGNOSIS — M1990 Unspecified osteoarthritis, unspecified site: Secondary | ICD-10-CM | POA: Diagnosis not present

## 2017-03-07 DIAGNOSIS — R059 Cough, unspecified: Secondary | ICD-10-CM

## 2017-03-07 LAB — BASIC METABOLIC PANEL
BUN: 14 mg/dL (ref 6–23)
CO2: 28 mEq/L (ref 19–32)
CREATININE: 0.53 mg/dL (ref 0.40–1.20)
Calcium: 8.9 mg/dL (ref 8.4–10.5)
Chloride: 102 mEq/L (ref 96–112)
GFR: 157.85 mL/min (ref >60.00)
Glucose, Bld: 75 mg/dL (ref 70–99)
Potassium: 4 mEq/L (ref 3.5–5.1)
Sodium: 137 mEq/L (ref 135–145)

## 2017-03-07 LAB — HDL CHOLESTEROL: HDL: 55 mg/dL (ref >39.00)

## 2017-03-07 LAB — CHOLESTEROL, TOTAL: CHOLESTEROL: 131 mg/dL (ref 0–200)

## 2017-03-07 LAB — CBC
HCT: 37.9 % (ref 36.0–46.0)
Hemoglobin: 12.3 g/dL (ref 12.0–15.0)
MCHC: 32.5 g/dL (ref 30.0–36.0)
MCV: 89.7 fl (ref 78.0–100.0)
Platelets: 243 10*3/uL (ref 150.0–400.0)
RBC: 4.23 Mil/uL (ref 3.87–5.11)
RDW: 13.4 % (ref 11.5–15.5)
WBC: 7 10*3/uL (ref 4.0–10.5)

## 2017-03-07 LAB — HEMOGLOBIN A1C: HEMOGLOBIN A1C: 5.9 % (ref 4.6–6.5)

## 2017-03-07 MED ORDER — BUPROPION HCL ER (SR) 150 MG PO TB12: 150.0000 mg | ORAL_TABLET | Freq: Every day | ORAL | 0 refills | Status: DC

## 2017-03-07 MED FILL — BUPROPION SR 150 MG TABLET: 150 | 90 days supply | Qty: 90 | Fill #0

## 2017-03-07 NOTE — Telephone Encounter (Signed)
The medication that was just sent to the pharmacy it should be going to [facility].

## 2017-03-07 NOTE — Telephone Encounter (Signed)
Rx sent to [facility] pharmacy.

## 2017-03-07 NOTE — Patient Instructions (Addendum)
BEFORE YOU LEAVE: -flu shot if she wishes -labs -xray sheet -follow up: 1 month  Go get the chest x-ray.  Start the Wellbutrin today. Quit smoking in 1 week.  Start getting some regular gentle exercise outside on a regular basis. Please try to eat healthy, try to get at least 5-7 servings of colorful vegetables per day. Avoid processed foods, red meat, fried foods and sweets.  We have ordered labs or studies at this visit. It can take up to 1-2 weeks for results and processing. IF results require follow up or explanation, we will call you with instructions. Clinically stable results will be released to your [facility]. If you have not heard from [place] or cannot find your results in [facility] in 2 weeks please contact our office at [phone].  If you are not yet signed up for [facility], please consider signing up.  Procedure orthopedic specialist about her knee and hip issues.

## 2017-03-07 NOTE — Telephone Encounter (Signed)
Rx done. 

## 2017-04-02 NOTE — Progress Notes (Addendum)
HPI:  Kayla [name] is a pleasant 46 yo here for follow up regarding several issues. See notes from 03/07/17 - reviewed. Due for mammo  Sore throat, swollen glands, right ear pain: -Started a few days ago -No fevers, shortness of breath, body aches, wheezing, sinus pain, hearing loss or drainage from the ear No known strep exposure  Tobacco use: -started wellbutrin last visit -Quit smoking 9 days ago  -This has been very tough for her -She is having strong cravings -She has tried gum for the cravings, does not like the gum  Elevated BP/Morbid obesity/prediabetes: -she wanted to try lifestyle changes -Trying to do better  Chronic Cough:  -neg cxr -advised to quit smoking -reports her cough is improved with quitting smoking  ROS: See pertinent positives and negatives per HPI.  Past Medical History:  Diagnosis Date  . Anemia   . Fatigue   . Obesity   . Sleep apnea    lost weight 2013 gastric sleeve, no longer a problem  . SVD (spontaneous vaginal delivery)    x 3  . Tobacco use     Past Surgical History:  Procedure Laterality Date  . BILATERAL SALPINGECTOMY Bilateral 09/27/2013   Procedure: BILATERAL SALPINGECTOMY;  Surgeon: Kayla Crocker, MD;  Location: [facility];  Service: Gynecology;  Laterality: Bilateral;  . CESAREAN SECTION     x 1  . LAPAROTOMY Left 09/27/2013   Procedure: EXPLORATORY LAPAROTOMY W/ EXPLORATION OF LEFT ILLIAC ARTERY & VEIN;  Surgeon: Kayla Crocker, MD;  Location: [facility];  Service: Gynecology;  Laterality: Left;  . ROBOTIC ASSISTED TOTAL HYSTERECTOMY N/A 09/27/2013   Procedure: ROBOTIC ASSISTED TOTAL HYSTERECTOMY;  Surgeon: Kayla Crocker, MD;  Location: [facility];  Service: Gynecology;  Laterality: N/A;  . SLEEVE GASTROPLASTY  2013   in [place]    Family History  Problem Relation Age of Onset  . Hypertension Mother   . Colon polyps Mother   . Colon cancer Maternal Grandfather 50  . Colon cancer Maternal Aunt        onset in 24s  .  Stomach cancer Maternal Aunt   . Leukemia Paternal Grandmother   . Colon polyps Maternal Uncle   . Colon polyps Maternal Aunt     Social History   Socioeconomic History  . Marital status: Married    Spouse name: None  . Number of children: None  . Years of education: None  . Highest education level: None  Social Needs  . Financial resource strain: None  . Food insecurity - worry: None  . Food insecurity - inability: None  . Transportation needs - medical: None  . Transportation needs - non-medical: None  Occupational History  . None  Tobacco Use  . Smoking status: Former Smoker    Packs/day: 1.00    Years: 25.00    Pack years: 25.00    Types: Cigarettes    Last attempt to quit: 03/27/2017    Years since quitting: 0.0  . Smokeless tobacco: Current User  . Tobacco comment: vape  Substance and Sexual Activity  . Alcohol use: Yes    Alcohol/week: 0.0 oz    Comment: socially  . Drug use: No  . Sexual activity: Yes    Partners: Male    Birth control/protection: None  Other Topics Concern  . 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  BIOTIN PO, Take by mouth., Disp: , Rfl:  .  buPROPion (WELLBUTRIN SR) 150 MG 12 hr tablet, Take 1 tablet (150 mg total) by mouth daily., Disp: 90 tablet, Rfl: 0 .  Multiple Vitamin (MULTIVITAMIN WITH MINERALS) TABS tablet, Take 1 tablet by mouth daily., Disp: , Rfl:  .  NON FORMULARY, , Disp: , Rfl:  .  traZODone (DESYREL) 50 MG tablet, Take 25 mg by mouth at bedtime as needed for sleep., Disp: , Rfl:  .  triamcinolone ointment (KENALOG) 0.5 %, Apply 1 application topically 2 (two) times daily., Disp: 30 g, Rfl: 0 .  trimethoprim (TRIMPEX) 100 MG tablet, Take 100 mg by mouth daily. Recurrent Bladder Infection, Disp:  , Rfl:  .  vitamin B-12 (CYANOCOBALAMIN) 100 MCG tablet, Take 100 mcg by mouth daily., Disp: , Rfl:   EXAM:  Vitals:   04/04/17 0958  BP: 122/84  Pulse: 90  Temp: 97.8 F (36.6 C)    Body mass index is 37.62 kg/m.  GENERAL: vitals reviewed and listed above, alert, oriented, appears well hydrated and in no acute distress  HEENT: atraumatic, conjunttiva clear, no obvious abnormalities on inspection of external nose and ears, on inspection of the oropharynx she has 2+ tonsillar edema with drainage, there is a clear effusion behind the right TM   NECK: no obvious masses on inspection, she has bilateral anterior cervical lymphadenopathy that is tender  LUNGS: clear to auscultation bilaterally, no wheezes, rales or rhonchi, good air movement  CV: HRRR, no peripheral edema  MS: moves all extremities without noticeable abnormality  PSYCH: pleasant and cooperative, no obvious depression or anxiety  ASSESSMENT AND PLAN:  Discussed the following assessment and plan:  Elevated blood pressure reading -Improving, she prefers to continue to try lifestyle changes  Tobacco use -Counseled and supported for about 5 minutes  -Discussed ways to handle cravings -She is concerned about weight gain with quitting, discussed ways to manage this as well -Follow-up in 3-4 weeks -Continue Wellbutrin  Cough -resolved with quitting smoking  Sore throat -we discussed possible serious and likely etiologies, workup and treatment, treatment risks and return precautions - likely viral pharyngitis but will do strep testing -after this discussion, Kayla [name] opted for symptomatic care, abx if strep -follow up advised in 3 weeks to recheck, sooner if worsening or not improving as expected  Addendum: strep testing +, pt informed and will send abx.   Patient Instructions  BEFORE YOU LEAVE: -Strep testing -follow up: 3-4 weeks to recheck the lymph nodes in the neck   Hang in there!  Do  this.  Continue the Wellbutrin.   We recommend the following healthy lifestyle for LIFE: 1) Small portions. But, make sure to get regular (at least 3 per day), healthy meals and small healthy snacks if needed.  2) Eat a healthy clean diet.   TRY TO EAT: -at least 5-7 servings of low sugar, colorful, and nutrient rich vegetables per day (not corn, potatoes or bananas.) -berries are the best choice if you wish to eat fruit (only eat small amounts if trying to reduce weight)  -lean meets (fish, white meat of chicken or [place]) -vegan proteins for some meals - beans or tofu, whole grains, nuts and seeds -Replace bad fats with good fats - good fats include: fish, nuts and seeds, canola oil, olive oil -small amounts  of low fat or non fat dairy -small amounts of100 % whole grains - check the lables -drink plenty of water  AVOID: -SUGAR, sweets, anything with added sugar, corn syrup or sweeteners - must read labels as even foods advertised as "healthy" often are loaded with sugar -if you must have a sweetener, small amounts of stevia may be best -sweetened beverages and artificially sweetened beverages -simple starches (rice, bread, potatoes, pasta, chips, etc - small amounts of 100% whole grains are ok) -red meat, pork, butter -fried foods, fast food, processed food, excessive dairy, eggs and coconut.  3)Get at least 150 minutes of sweaty aerobic exercise per week.  4)Reduce stress - consider counseling, meditation and relaxation to balance other aspects of your life.    Colin Benton R., DO

## 2017-04-04 ENCOUNTER — Telehealth: Payer: Self-pay | Admitting: Family Medicine

## 2017-04-04 ENCOUNTER — Encounter: Payer: Self-pay | Admitting: Family Medicine

## 2017-04-04 ENCOUNTER — Ambulatory Visit (INDEPENDENT_AMBULATORY_CARE_PROVIDER_SITE_OTHER): Payer: 59 | Admitting: Family Medicine

## 2017-04-04 VITALS — BP 122/84 | HR 90 | Temp 97.8°F | Ht 66.0 in | Wt 233.1 lb

## 2017-04-04 DIAGNOSIS — Z720 Tobacco use: Secondary | ICD-10-CM

## 2017-04-04 DIAGNOSIS — R05 Cough: Secondary | ICD-10-CM

## 2017-04-04 DIAGNOSIS — J029 Acute pharyngitis, unspecified: Secondary | ICD-10-CM | POA: Diagnosis not present

## 2017-04-04 DIAGNOSIS — R030 Elevated blood-pressure reading, without diagnosis of hypertension: Secondary | ICD-10-CM

## 2017-04-04 DIAGNOSIS — R059 Cough, unspecified: Secondary | ICD-10-CM

## 2017-04-04 LAB — POCT RAPID STREP A (OFFICE): Rapid Strep A Screen: POSITIVE — AB

## 2017-04-04 MED ORDER — AMOXICILLIN 500 MG PO CAPS
500.0000 mg | ORAL_CAPSULE | Freq: Two times a day (BID) | ORAL | 0 refills | Status: AC
Start: 2017-04-04 — End: 2017-04-14

## 2017-04-04 MED ORDER — AMOXICILLIN 500 MG PO CAPS: 500.0000 mg | ORAL_CAPSULE | Freq: Two times a day (BID) | ORAL | 0 refills | Status: DC

## 2017-04-04 NOTE — Telephone Encounter (Signed)
Copied from [place] #[id number]. Topic: Quick Communication - See Telephone Encounter >> Apr 04, 2017 11:43 AM Bea Graff, NT wrote: CRM for notification. See Telephone encounter for: Patient needing her amoxicillin rx sent to [facility] on [address]. She does not use [facility].  04/04/17.

## 2017-04-04 NOTE — Addendum Note (Signed)
Addended by: Agnes Lawrence on: 04/04/2017 10:29 AM   Modules accepted: Orders

## 2017-04-04 NOTE — Addendum Note (Signed)
Addended by: [name] on: 04/04/2017 10:44 AM   Modules accepted: Orders

## 2017-04-04 NOTE — Telephone Encounter (Signed)
Rx done and I called the pt and informed her of this. 

## 2017-04-04 NOTE — Patient Instructions (Signed)
BEFORE YOU LEAVE: -Strep testing -follow up: 3-4 weeks to recheck the lymph nodes in the neck   Hang in there!  Do this.  Continue the Wellbutrin.   We recommend the following healthy lifestyle for LIFE: 1) Small portions. But, make sure to get regular (at least 3 per day), healthy meals and small healthy snacks if needed.  2) Eat a healthy clean diet.   TRY TO EAT: -at least 5-7 servings of low sugar, colorful, and nutrient rich vegetables per day (not corn, potatoes or bananas.) -berries are the best choice if you wish to eat fruit (only eat small amounts if trying to reduce weight)  -lean meets (fish, white meat of chicken or [place]) -vegan proteins for some meals - beans or tofu, whole grains, nuts and seeds -Replace bad fats with good fats - good fats include: fish, nuts and seeds, canola oil, olive oil -small amounts of low fat or non fat dairy -small amounts of100 % whole grains - check the lables -drink plenty of water  AVOID: -SUGAR, sweets, anything with added sugar, corn syrup or sweeteners - must read labels as even foods advertised as "healthy" often are loaded with sugar -if you must have a sweetener, small amounts of stevia may be best -sweetened beverages and artificially sweetened beverages -simple starches (rice, bread, potatoes, pasta, chips, etc - small amounts of 100% whole grains are ok) -red meat, pork, butter -fried foods, fast food, processed food, excessive dairy, eggs and coconut.  3)Get at least 150 minutes of sweaty aerobic exercise per week.  4)Reduce stress - consider counseling, meditation and relaxation to balance other aspects of your life.

## 2017-07-04 ENCOUNTER — Other Ambulatory Visit: Payer: Self-pay | Admitting: Family Medicine

## 2017-07-04 MED FILL — TRIMETHOPRIM 100 MG TABLET: 100 | 30 days supply | Qty: 30 | Fill #1

## 2017-07-04 MED FILL — BUPROPION SR 150 MG TABLET: 150 | 90 days supply | Qty: 90 | Fill #0

## 2017-07-04 NOTE — Telephone Encounter (Signed)
Patient would like a call once sent

## 2017-07-04 NOTE — Telephone Encounter (Signed)
Copied from [place]. Topic: Quick Communication - Rx Refill/Question >> Jul 04, 2017  9:35 AM Synthia Innocent wrote: Medication:  buPROPion (WELLBUTRIN SR) 150 MG 12 hr tablet   Has the patient contacted their pharmacy? Yes.     (Agent: If no, request that the patient contact the pharmacy for the refill.)   Preferred Pharmacy (with phone number or street name): [place]   Agent: Please be advised that RX refills may take up to 3 business days. We ask that you follow-up with your pharmacy.

## 2017-07-04 NOTE — Telephone Encounter (Signed)
Rx done and I called the pt and informed her of this. 

## 2017-09-24 NOTE — Progress Notes (Signed)
HPI:  Using dictation device. Unfortunately this device frequently misinterprets words/phrases.  Here for CPE: Due for mammo, labs  -Concerns and/or follow up today:   Chronic medical problems summarized below were reviewed for changes. Reports diet has been bad and no regular exercise. She started smoking again and now quit a few days ago but is having difficulty with cravings and wants to start chantix. She has a history of weight loss surgery, but has regained. She feels like making dietary changes would be hard. She loves fried chicken and fried fish.   Tobacco use: -started wellbutrin last visit -Quit smoking in 2018 with Wellbutrin; was very tough -restarted in 2019  Elevated BP/Morbid obesity/prediabetes: -struggle with diet - loves fried fish and fried chicken -no regular exercise  -Diet: variety of foods, balance and well rounded, larger portion sizes -Exercise: no regular exercise -Taking folic acid, vitamin D or calcium: no -Diabetes and Dyslipidemia Screening: due for labs -Vaccines: see vaccine section EPIC -pap history: 08/2013 neg with neg hpv, S/p TRH with Dr. Delsa Sale for fibroids later in 2016 -FDLMP: see nursing notes -sexual activity: yes, female partner, no new partners -wants STI testing (Hep C if born 43-65): no -FH breast, colon or ovarian ca: see FH Last mammogram: last done 2016 in [facility] Last colon cancer screening: n/a Breast Ca Risk Assessment: see family history and pt history DEXA (>/= 65): n/a  -Alcohol, Tobacco, drug use: see social history  Review of Systems - no reported  fevers, unintentional weight loss, vision loss, hearing loss, chest pain, sob, hemoptysis, melena, hematochezia, hematuria, genital discharge, changing or concerning skin lesions, bleeding, bruising, loc, thoughts of self harm or SI  Past Medical History:  Diagnosis Date  . Anemia   . Fatigue   . Obesity   . Sleep apnea    lost weight 2013 gastric sleeve, no longer  a problem  . SVD (spontaneous vaginal delivery)    x 3  . Tobacco use     Past Surgical History:  Procedure Laterality Date  . BILATERAL SALPINGECTOMY Bilateral 09/27/2013   Procedure: BILATERAL SALPINGECTOMY;  Surgeon: Lahoma Crocker, MD;  Location: [facility];  Service: Gynecology;  Laterality: Bilateral;  . CESAREAN SECTION     x 1  . LAPAROTOMY Left 09/27/2013   Procedure: EXPLORATORY LAPAROTOMY W/ EXPLORATION OF LEFT ILLIAC ARTERY & VEIN;  Surgeon: Lahoma Crocker, MD;  Location: [facility];  Service: Gynecology;  Laterality: Left;  . ROBOTIC ASSISTED TOTAL HYSTERECTOMY N/A 09/27/2013   Procedure: ROBOTIC ASSISTED TOTAL HYSTERECTOMY;  Surgeon: Lahoma Crocker, MD;  Location: [facility];  Service: Gynecology;  Laterality: N/A;  . SLEEVE GASTROPLASTY  2013   in [place]    Family History  Problem Relation Age of Onset  . Hypertension Mother   . Colon polyps Mother   . Colon cancer Maternal Grandfather 40  . Colon cancer Maternal Aunt        onset in 70s  . Stomach cancer Maternal Aunt   . Leukemia Paternal Grandmother   . Colon polyps Maternal Uncle   . Colon polyps Maternal Aunt     Social History   Socioeconomic History  . Marital status: Married    Spouse name: Not on file  . Number of children: Not on file  . Years of education: Not on file  . Highest education level: Not on file  Occupational History  . Not on file  Social Needs  . Financial resource strain: Not on file  . Food insecurity:  Worry: Not on file    Inability: Not on file  . Transportation needs:    Medical: Not on file    Non-medical: Not on file  Tobacco Use  . Smoking status: Former Smoker    Packs/day: 1.00    Years: 25.00    Pack years: 25.00    Types: Cigarettes    Last attempt to quit: 03/27/2017    Years since quitting: 0.4  . Smokeless tobacco: Current User  . Tobacco comment: vape  Substance and Sexual Activity  . Alcohol use: Yes    Alcohol/week: 0.0 oz    Comment: socially  .  Drug use: No  . Sexual activity: Yes    Partners: Male    Birth control/protection: None  Lifestyle  . Physical activity:    Days per week: Not on file    Minutes per session: Not on file  . Stress: Not on file  Relationships  . Social connections:    Talks on phone: Not on file    Gets together: Not on file    Attends religious service: Not on file    Active member of club or organization: Not on file    Attends meetings of clubs or organizations: Not on file    Relationship status: Not on file  Other Topics Concern  . Not on file  Social History Narrative   Work or School: Producer, television/film/video, Catering manager      Home Situation: lives with husband and 42 yo sone in 2016      Spiritual Beliefs: Christian      Lifestyle: no regular exercise, poor diet        Current Outpatient Medications:  .  traZODone (DESYREL) 50 MG tablet, Take 25 mg by mouth at bedtime as needed for sleep., Disp: , Rfl:  .  varenicline (CHANTIX CONTINUING MONTH PAK) 1 MG tablet, Take 1 tablet (1 mg total) by mouth 2 (two) times daily., Disp: 120 tablet, Rfl: 0 .  varenicline (CHANTIX STARTING MONTH PAK) 0.5 MG X 11 & 1 MG X 42 tablet, Take one 0.5 mg tablet by mouth once daily for 3 days, then increase to one 0.5 mg tablet twice daily for 4 days, then increase to one 1 mg tablet twice daily., Disp: 53 tablet, Rfl: 0  EXAM:  Vitals:   09/25/17 1052  BP: 102/82  Pulse: 95  Temp: 97.8 F (36.6 C)   Body mass index is 39.4 kg/m.  GENERAL: vitals reviewed and listed below, alert, oriented, appears well hydrated and in no acute distress  HEENT: head atraumatic, PERRLA, normal appearance of eyes, ears, nose and mouth. moist mucus membranes.  NECK: supple, no masses or lymphadenopathy  LUNGS: clear to auscultation bilaterally, no rales, rhonchi or wheeze  CV: HRRR, no peripheral edema or cyanosis, normal pedal pulses  ABDOMEN: bowel sounds normal, soft, non tender to palpation, no masses, no rebound or  guarding  GU/BREAST: declined, she agrees to go to breast center for mammogram  SKIN: no rash or abnormal lesions  MS: normal gait, moves all extremities normally  NEURO: normal gait, speech and thought processing grossly intact, muscle tone grossly intact throughout  PSYCH: normal affect, pleasant and cooperative  ASSESSMENT AND PLAN:  Discussed the following assessment and plan:  PREVENTIVE EXAM: -Discussed and advised all [place] preventive services health task force level A and B recommendations for age, sex and risks. -Advised at least 150 minutes of exercise per week and a healthy diet with avoidance of (less then  1 serving per week) processed foods, white starches, red meat, fast foods and sweets and consisting of: * 5-9 servings of fresh fruits and vegetables (not corn or potatoes) *nuts and seeds, beans *olives and olive oil *lean meats such as fish and white chicken  *whole grains -she does not want to eat healthy - feels this is too heard, but she does want to get her weight down. Advised there is no good way to get weight down and keep it down without eating healthy and getting adequate exercise. Discussed options and she wants [facility] - advised assistant to place referral to Dr. Migdalia Dk clinic. -advised needs mammogram, advised assistant to order and pt to ensure she does this -labs, studies and vaccines per orders this encounter  2. Screening for depression -see phq9  3. Tobacco use -chantix rx after discussion motivation, risks, options, etc, counseling x 5 minutes  4. Morbid obesity ([place]) -counseled at length - Amb Ref to [facility] Weight [facility] -advised 10-20 lb wt reduction over next 3-6 months and lifelong commitment to healthy diet and regular exercise rather then any temporary plan  5. Hyperglycemia -lifestyle recs - Hemoglobin A1c  6. Hyperlipidemia, unspecified hyperlipidemia type -lifestyle recs - HDL cholesterol -  Cholesterol, total  7. Breast cancer screening - MM Digital Screening; Future  Patient advised to return to clinic immediately if symptoms worsen or persist or new concerns.  Patient Instructions  BEFORE YOU LEAVE: -labs -REFER for mammogram -referral to wt management clinic -follow up: 3 months  Please complete your mammogram as soon as possible.  Start the chantix starter pack. Follow instructions. Then, continuing pack after completion starter pack. Congratulations on quitting again.  We have ordered labs or studies at this visit. It can take up to 1-2 weeks for results and processing. IF results require follow up or explanation, we will call you with instructions. Clinically stable results will be released to your [facility]. If you have not heard from [place] or cannot find your results in [facility] in 2 weeks please contact our office at [phone].  If you are not yet signed up for [facility], please consider signing up.   We recommend the following healthy lifestyle for LIFE: 1) Small portions. But, make sure to get regular (at least 3 per day), healthy meals and small healthy snacks if needed.  2) Eat a healthy clean diet.   TRY TO EAT: -at least 5-7 servings of low sugar, colorful, and nutrient rich vegetables per day (not corn, potatoes or bananas.) -berries are the best choice if you wish to eat fruit (only eat small amounts if trying to reduce weight)  -lean meets (fish, white meat of chicken or [place]) -vegan proteins for some meals - beans or tofu, whole grains, nuts and seeds -Replace bad fats with good fats - good fats include: fish, nuts and seeds, canola oil, olive oil -small amounts of low fat or non fat dairy -small amounts of100 % whole grains - check the lables -drink plenty of water  AVOID: -SUGAR, sweets, anything with added sugar, corn syrup or sweeteners - must read labels as even foods advertised as "healthy" often are loaded with sugar -if you must have a  sweetener, small amounts of stevia may be best -sweetened beverages and artificially sweetened beverages -simple starches (rice, bread, potatoes, pasta, chips, etc - small amounts of 100% whole grains are ok) -red meat, pork, butter -fried foods, fast food, processed food, excessive dairy, eggs and coconut.  3)Get  at least 150 minutes of sweaty aerobic exercise per week.  4)Reduce stress - consider counseling, meditation and relaxation to balance other aspects of your life.   Preventive Care 40-64 Years, Female Preventive care refers to lifestyle choices and visits with your health care provider that can promote health and wellness. What does preventive care include?  A yearly physical exam. This is also called an annual well check.  Dental exams once or twice a year.  Routine eye exams. Ask your health care provider how often you should have your eyes checked.  Personal lifestyle choices, including: ? Daily care of your teeth and gums. ? Regular physical activity. ? Eating a healthy diet. ? Avoiding tobacco and drug use. ? Limiting alcohol use. ? Practicing safe sex. ? Taking low-dose aspirin daily starting at age 23. ? Taking vitamin and mineral supplements as recommended by your health care provider. What happens during an annual well check? The services and screenings done by your health care provider during your annual well check will depend on your age, overall health, lifestyle risk factors, and family history of disease. Counseling Your health care provider may ask you questions about your:  Alcohol use.  Tobacco use.  Drug use.  Emotional well-being.  Home and relationship well-being.  Sexual activity.  Eating habits.  Work and work Statistician.  Method of birth control.  Menstrual cycle.  Pregnancy history.  Screening You may have the following tests or measurements:  Height, weight, and BMI.  Blood pressure.  Lipid and cholesterol levels.  These may be checked every 5 years, or more frequently if you are over 37 years old.  Skin check.  Lung cancer screening. You may have this screening every year starting at age 37 if you have a 30-pack-year history of smoking and currently smoke or have quit within the past 15 years.  Fecal occult blood test (FOBT) of the stool. You may have this test every year starting at age 10.  Flexible sigmoidoscopy or colonoscopy. You may have a sigmoidoscopy every 5 years or a colonoscopy every 10 years starting at age 39.  Hepatitis C blood test.  Hepatitis B blood test.  Sexually transmitted disease (STD) testing.  Diabetes screening. This is done by checking your blood sugar (glucose) after you have not eaten for a while (fasting). You may have this done every 1-3 years.  Mammogram. This may be done every 1-2 years. Talk to your health care provider about when you should start having regular mammograms. This may depend on whether you have a family history of breast cancer.  BRCA-related cancer screening. This may be done if you have a family history of breast, ovarian, tubal, or peritoneal cancers.  Pelvic exam and Pap test. This may be done every 3 years starting at age 68. Starting at age 15, this may be done every 5 years if you have a Pap test in combination with an HPV test.  Bone density scan. This is done to screen for osteoporosis. You may have this scan if you are at high risk for osteoporosis.  Discuss your test results, treatment options, and if necessary, the need for more tests with your health care provider. Vaccines Your health care provider may recommend certain vaccines, such as:  Influenza vaccine. This is recommended every year.  Tetanus, diphtheria, and acellular pertussis (Tdap, Td) vaccine. You may need a Td booster every 10 years.  Varicella vaccine. You may need this if you have not been vaccinated.  Zoster vaccine.  You may need this after age 58.  Measles,  mumps, and rubella (MMR) vaccine. You may need at least one dose of MMR if you were born in 1957 or later. You may also need a second dose.  Pneumococcal 13-valent conjugate (PCV13) vaccine. You may need this if you have certain conditions and were not previously vaccinated.  Pneumococcal polysaccharide (PPSV23) vaccine. You may need one or two doses if you smoke cigarettes or if you have certain conditions.  Meningococcal vaccine. You may need this if you have certain conditions.  Hepatitis A vaccine. You may need this if you have certain conditions or if you travel or work in places where you may be exposed to hepatitis A.  Hepatitis B vaccine. You may need this if you have certain conditions or if you travel or work in places where you may be exposed to hepatitis B.  Haemophilus influenzae type b (Hib) vaccine. You may need this if you have certain conditions.  Talk to your health care provider about which screenings and vaccines you need and how often you need them. This information is not intended to replace advice given to you by your health care provider. Make sure you discuss any questions you have with your health care provider. Document Released: 05/22/2015 Document Revised: 01/13/2016 Document Reviewed: 02/24/2015 Elsevier Interactive Patient Education  2018 [employer].        No follow-ups on file.  [name], DO

## 2017-09-25 ENCOUNTER — Encounter: Payer: Self-pay | Admitting: Family Medicine

## 2017-09-25 ENCOUNTER — Ambulatory Visit (INDEPENDENT_AMBULATORY_CARE_PROVIDER_SITE_OTHER): Payer: 59 | Admitting: Family Medicine

## 2017-09-25 VITALS — BP 102/82 | HR 95 | Temp 97.8°F | Ht 66.0 in | Wt 244.1 lb

## 2017-09-25 DIAGNOSIS — R739 Hyperglycemia, unspecified: Secondary | ICD-10-CM | POA: Diagnosis not present

## 2017-09-25 DIAGNOSIS — Z1331 Encounter for screening for depression: Secondary | ICD-10-CM

## 2017-09-25 DIAGNOSIS — Z1231 Encounter for screening mammogram for malignant neoplasm of breast: Secondary | ICD-10-CM | POA: Diagnosis not present

## 2017-09-25 DIAGNOSIS — Z0000 Encounter for general adult medical examination without abnormal findings: Secondary | ICD-10-CM | POA: Diagnosis not present

## 2017-09-25 DIAGNOSIS — Z720 Tobacco use: Secondary | ICD-10-CM

## 2017-09-25 DIAGNOSIS — E785 Hyperlipidemia, unspecified: Secondary | ICD-10-CM | POA: Diagnosis not present

## 2017-09-25 DIAGNOSIS — Z1239 Encounter for other screening for malignant neoplasm of breast: Secondary | ICD-10-CM

## 2017-09-25 LAB — CHOLESTEROL, TOTAL: CHOLESTEROL: 160 mg/dL (ref 0–200)

## 2017-09-25 LAB — HDL CHOLESTEROL: HDL: 53 mg/dL (ref >39.00)

## 2017-09-25 LAB — HEMOGLOBIN A1C: Hgb A1c MFr Bld: 6.1 % (ref 4.6–6.5)

## 2017-09-25 MED ORDER — VARENICLINE TARTRATE 0.5 MG X 11 & 1 MG X 42 PO MISC: ORAL | 0 refills | Status: DC

## 2017-09-25 MED ORDER — VARENICLINE TARTRATE 1 MG PO TABS: 1.0000 mg | ORAL_TABLET | Freq: Two times a day (BID) | ORAL | 0 refills | Status: DC

## 2017-09-25 MED FILL — CHANTIX STARTING MONTH BOX: 0.5 MG X 11 | 28 days supply | Qty: 53 | Fill #0

## 2017-09-25 NOTE — Patient Instructions (Addendum)
BEFORE YOU LEAVE: -labs -REFER for mammogram -referral to wt management clinic -follow up: 3 months  Please complete your mammogram as soon as possible.  Start the chantix starter pack. Follow instructions. Then, continuing pack after completion starter pack. Congratulations on quitting again.  We have ordered labs or studies at this visit. It can take up to 1-2 weeks for results and processing. IF results require follow up or explanation, we will call you with instructions. Clinically stable results will be released to your [facility]. If you have not heard from [place] or cannot find your results in [facility] in 2 weeks please contact our office at [phone].  If you are not yet signed up for [facility], please consider signing up.   We recommend the following healthy lifestyle for LIFE: 1) Small portions. But, make sure to get regular (at least 3 per day), healthy meals and small healthy snacks if needed.  2) Eat a healthy clean diet.   TRY TO EAT: -at least 5-7 servings of low sugar, colorful, and nutrient rich vegetables per day (not corn, potatoes or bananas.) -berries are the best choice if you wish to eat fruit (only eat small amounts if trying to reduce weight)  -lean meets (fish, white meat of chicken or [place]) -vegan proteins for some meals - beans or tofu, whole grains, nuts and seeds -Replace bad fats with good fats - good fats include: fish, nuts and seeds, canola oil, olive oil -small amounts of low fat or non fat dairy -small amounts of100 % whole grains - check the lables -drink plenty of water  AVOID: -SUGAR, sweets, anything with added sugar, corn syrup or sweeteners - must read labels as even foods advertised as "healthy" often are loaded with sugar -if you must have a sweetener, small amounts of stevia may be best -sweetened beverages and artificially sweetened beverages -simple starches (rice, bread, potatoes, pasta, chips, etc - small amounts of 100% whole grains are  ok) -red meat, pork, butter -fried foods, fast food, processed food, excessive dairy, eggs and coconut.  3)Get at least 150 minutes of sweaty aerobic exercise per week.  4)Reduce stress - consider counseling, meditation and relaxation to balance other aspects of your life.   Preventive Care 40-64 Years, Female Preventive care refers to lifestyle choices and visits with your health care provider that can promote health and wellness. What does preventive care include?  A yearly physical exam. This is also called an annual well check.  Dental exams once or twice a year.  Routine eye exams. Ask your health care provider how often you should have your eyes checked.  Personal lifestyle choices, including: ? Daily care of your teeth and gums. ? Regular physical activity. ? Eating a healthy diet. ? Avoiding tobacco and drug use. ? Limiting alcohol use. ? Practicing safe sex. ? Taking low-dose aspirin daily starting at age 71. ? Taking vitamin and mineral supplements as recommended by your health care provider. What happens during an annual well check? The services and screenings done by your health care provider during your annual well check will depend on your age, overall health, lifestyle risk factors, and family history of disease. Counseling Your health care provider may ask you questions about your:  Alcohol use.  Tobacco use.  Drug use.  Emotional well-being.  Home and relationship well-being.  Sexual activity.  Eating habits.  Work and work Statistician.  Method of birth control.  Menstrual cycle.  Pregnancy history.  Screening You may have the following  tests or measurements:  Height, weight, and BMI.  Blood pressure.  Lipid and cholesterol levels. These may be checked every 5 years, or more frequently if you are over 43 years old.  Skin check.  Lung cancer screening. You may have this screening every year starting at age 8 if you have a 30-pack-year  history of smoking and currently smoke or have quit within the past 15 years.  Fecal occult blood test (FOBT) of the stool. You may have this test every year starting at age 81.  Flexible sigmoidoscopy or colonoscopy. You may have a sigmoidoscopy every 5 years or a colonoscopy every 10 years starting at age 62.  Hepatitis C blood test.  Hepatitis B blood test.  Sexually transmitted disease (STD) testing.  Diabetes screening. This is done by checking your blood sugar (glucose) after you have not eaten for a while (fasting). You may have this done every 1-3 years.  Mammogram. This may be done every 1-2 years. Talk to your health care provider about when you should start having regular mammograms. This may depend on whether you have a family history of breast cancer.  BRCA-related cancer screening. This may be done if you have a family history of breast, ovarian, tubal, or peritoneal cancers.  Pelvic exam and Pap test. This may be done every 3 years starting at age 67. Starting at age 74, this may be done every 5 years if you have a Pap test in combination with an HPV test.  Bone density scan. This is done to screen for osteoporosis. You may have this scan if you are at high risk for osteoporosis.  Discuss your test results, treatment options, and if necessary, the need for more tests with your health care provider. Vaccines Your health care provider may recommend certain vaccines, such as:  Influenza vaccine. This is recommended every year.  Tetanus, diphtheria, and acellular pertussis (Tdap, Td) vaccine. You may need a Td booster every 10 years.  Varicella vaccine. You may need this if you have not been vaccinated.  Zoster vaccine. You may need this after age 65.  Measles, mumps, and rubella (MMR) vaccine. You may need at least one dose of MMR if you were born in 1957 or later. You may also need a second dose.  Pneumococcal 13-valent conjugate (PCV13) vaccine. You may need this if  you have certain conditions and were not previously vaccinated.  Pneumococcal polysaccharide (PPSV23) vaccine. You may need one or two doses if you smoke cigarettes or if you have certain conditions.  Meningococcal vaccine. You may need this if you have certain conditions.  Hepatitis A vaccine. You may need this if you have certain conditions or if you travel or work in places where you may be exposed to hepatitis A.  Hepatitis B vaccine. You may need this if you have certain conditions or if you travel or work in places where you may be exposed to hepatitis B.  Haemophilus influenzae type b (Hib) vaccine. You may need this if you have certain conditions.  Talk to your health care provider about which screenings and vaccines you need and how often you need them. This information is not intended to replace advice given to you by your health care provider. Make sure you discuss any questions you have with your health care provider. Document Released: 05/22/2015 Document Revised: 01/13/2016 Document Reviewed: 02/24/2015 Elsevier Interactive Patient Education  [employer].

## 2017-09-29 ENCOUNTER — Ambulatory Visit
Admission: RE | Admit: 2017-09-29 | Discharge: 2017-09-29 | Disposition: A | Discharge status: Home / Self Care | Payer: 59 | Source: Ambulatory Visit | Attending: Family Medicine | Admitting: Family Medicine

## 2017-09-29 DIAGNOSIS — Z1231 Encounter for screening mammogram for malignant neoplasm of breast: Secondary | ICD-10-CM | POA: Diagnosis not present

## 2017-09-29 DIAGNOSIS — Z1239 Encounter for other screening for malignant neoplasm of breast: Secondary | ICD-10-CM

## 2017-10-03 NOTE — Progress Notes (Signed)
I would advise an appointment for the knee issues as last visit was a physical/preventive visit. Thanks.

## 2017-11-28 MED FILL — CHANTIX 1 MG CONT MONTH BOX: 1 | 28 days supply | Qty: 56 | Fill #0

## 2017-12-07 ENCOUNTER — Encounter (INDEPENDENT_AMBULATORY_CARE_PROVIDER_SITE_OTHER): Payer: 59

## 2017-12-12 ENCOUNTER — Encounter (INDEPENDENT_AMBULATORY_CARE_PROVIDER_SITE_OTHER): Payer: Self-pay | Admitting: Family Medicine

## 2017-12-12 ENCOUNTER — Ambulatory Visit (INDEPENDENT_AMBULATORY_CARE_PROVIDER_SITE_OTHER): Payer: 59 | Admitting: Family Medicine

## 2017-12-12 VITALS — BP 121/85 | HR 80 | Temp 98.0°F | Ht 65.0 in | Wt 246.0 lb

## 2017-12-12 DIAGNOSIS — E66813 Obesity, class 3: Secondary | ICD-10-CM

## 2017-12-12 DIAGNOSIS — Z1331 Encounter for screening for depression: Secondary | ICD-10-CM

## 2017-12-12 DIAGNOSIS — R739 Hyperglycemia, unspecified: Secondary | ICD-10-CM | POA: Diagnosis not present

## 2017-12-12 DIAGNOSIS — Z9189 Other specified personal risk factors, not elsewhere classified: Secondary | ICD-10-CM | POA: Diagnosis not present

## 2017-12-12 DIAGNOSIS — R0602 Shortness of breath: Secondary | ICD-10-CM | POA: Diagnosis not present

## 2017-12-12 DIAGNOSIS — R5383 Other fatigue: Secondary | ICD-10-CM

## 2017-12-12 DIAGNOSIS — Z0289 Encounter for other administrative examinations: Secondary | ICD-10-CM

## 2017-12-12 DIAGNOSIS — Z6841 Body Mass Index (BMI) 40.0 and over, adult: Secondary | ICD-10-CM | POA: Diagnosis not present

## 2017-12-13 LAB — LIPID PANEL WITH LDL/HDL RATIO
CHOLESTEROL TOTAL: 161 mg/dL (ref 100–199)
HDL: 53 mg/dL (ref >39)
LDL Calculated: 88 mg/dL (ref 0–99)
LDL/HDL RATIO: 1.7 ratio (ref 0.0–3.2)
TRIGLYCERIDES: 98 mg/dL (ref 0–149)
VLDL Cholesterol Cal: 20 mg/dL (ref 5–40)

## 2017-12-13 LAB — CBC WITH DIFFERENTIAL
BASOS ABS: 0 10*3/uL (ref 0.0–0.2)
BASOS: 0 %
EOS (ABSOLUTE): 0.1 10*3/uL (ref 0.0–0.4)
Eos: 1 %
HEMOGLOBIN: 13.4 g/dL (ref 11.1–15.9)
Hematocrit: 41.7 % (ref 34.0–46.6)
IMMATURE GRANS (ABS): 0 10*3/uL (ref 0.0–0.1)
Immature Granulocytes: 0 %
LYMPHS: 26 %
Lymphocytes Absolute: 1.9 10*3/uL (ref 0.7–3.1)
MCH: 28.1 pg (ref 26.6–33.0)
MCHC: 32.1 g/dL (ref 31.5–35.7)
MCV: 87 fL (ref 79–97)
MONOCYTES: 5 %
Monocytes Absolute: 0.3 10*3/uL (ref 0.1–0.9)
NEUTROS ABS: 4.9 10*3/uL (ref 1.4–7.0)
Neutrophils: 68 %
RBC: 4.77 x10E6/uL (ref 3.77–5.28)
RDW: 14.7 % (ref 12.3–15.4)
WBC: 7.2 10*3/uL (ref 3.4–10.8)

## 2017-12-13 LAB — T3: T3, Total: 133 ng/dL (ref 71–180)

## 2017-12-13 LAB — COMPREHENSIVE METABOLIC PANEL
ALBUMIN: 4.6 g/dL (ref 3.5–5.5)
ALT: 15 IU/L (ref 0–32)
AST: 14 IU/L (ref 0–40)
Albumin/Globulin Ratio: 1.6 (ref 1.2–2.2)
Alkaline Phosphatase: 55 IU/L (ref 39–117)
BILIRUBIN TOTAL: 0.3 mg/dL (ref 0.0–1.2)
BUN / CREAT RATIO: 17 (ref 9–23)
BUN: 13 mg/dL (ref 6–24)
CHLORIDE: 100 mmol/L (ref 96–106)
CO2: 24 mmol/L (ref 20–29)
Calcium: 9.5 mg/dL (ref 8.7–10.2)
Creatinine, Ser: 0.76 mg/dL (ref 0.57–1.00)
GFR calc non Af Amer: 92 mL/min/{1.73_m2} (ref >59)
GFR, EST AFRICAN AMERICAN: 107 mL/min/{1.73_m2} (ref >59)
GLUCOSE: 88 mg/dL (ref 65–99)
Globulin, Total: 2.8 g/dL (ref 1.5–4.5)
Potassium: 4.5 mmol/L (ref 3.5–5.2)
Sodium: 138 mmol/L (ref 134–144)
TOTAL PROTEIN: 7.4 g/dL (ref 6.0–8.5)

## 2017-12-13 LAB — VITAMIN D 25 HYDROXY (VIT D DEFICIENCY, FRACTURES): VIT D 25 HYDROXY: 30.6 ng/mL (ref 30.0–100.0)

## 2017-12-13 LAB — FOLATE: FOLATE: 12.6 ng/mL (ref >3.0)

## 2017-12-13 LAB — VITAMIN B12: Vitamin B-12: 555 pg/mL (ref 232–1245)

## 2017-12-13 LAB — T4, FREE: FREE T4: 1.23 ng/dL (ref 0.82–1.77)

## 2017-12-13 LAB — TSH: TSH: 1.19 u[IU]/mL (ref 0.450–4.500)

## 2017-12-13 LAB — HEMOGLOBIN A1C
ESTIMATED AVERAGE GLUCOSE: 123 mg/dL
HEMOGLOBIN A1C: 5.9 % — AB (ref 4.8–5.6)

## 2017-12-13 LAB — INSULIN, RANDOM: INSULIN: 9.8 u[IU]/mL (ref 2.6–24.9)

## 2017-12-13 NOTE — Progress Notes (Signed)
Office: [phone]  /  Fax: [phone]   Dear Dr. Maudie Mercury,   Thank you for referring Kayla [name] to our clinic. The following note includes my evaluation and treatment recommendations.  HPI:   Chief Complaint: OBESITY    Kayla [name] has been referred by [name], DO for consultation regarding her obesity and obesity related comorbidities.    Kayla [name] (MR# [id number]) is a 47 y.o. female who presents on 12/13/2017 for obesity evaluation and treatment. Current BMI is Body mass index is 40.94 kg/m.Marland Kitchen Kayla [name] has been struggling with her weight for many years and has been unsuccessful in either losing weight, maintaining weight loss, or reaching her healthy weight goal.     Kayla [name] is status post gastric sleeve in 2011; done in [place]. She lost from 300 lbs, down to 175 lbs and states she has regained 50+ lbs in the last year. She wants to have her gastric sleeve "redone".     Kayla [name] attended our information session and states she is currently in the action stage of change and ready to dedicate time achieving and maintaining a healthier weight. Kayla [name] is interested in becoming our patient and working on intensive lifestyle modifications including (but not limited to) diet, exercise and weight loss.    Kayla [name] states her family eats meals together she thinks her family will eat healthier with  her her desired weight loss is 91 lbs her heaviest weight ever was 325 lbs. she has significant food cravings issues  she snacks frequently in the evenings she wakes up frequently in the middle of the night to eat she is frequently drinking liquids with calories she frequently makes poor food choices she has problems with excessive hunger  she frequently eats larger portions than normal  she has binge eating behaviors she struggles with emotional eating    Fatigue Kayla [name] feels her energy is lower than it should be. This has worsened with weight gain and has not worsened  recently. Kayla [name] admits to daytime somnolence and  admits to waking up still tired. Patient is at risk for obstructive sleep apnea. Patent has a history of symptoms of daytime fatigue, morning fatigue and morning headache. Patient generally gets 5 hours of sleep per night, and states they generally have restless sleep. Snoring is present. Apneic episodes are not present. Epworth Sleepiness Score is 12  Dyspnea on exertion Kayla [name] notes increasing shortness of breath with exercising and seems to be worsening over time with weight gain. She notes getting out of breath sooner with activity than she used to. This has not gotten worse recently. Kayla [name] admits orthopnea.  Hyperglycemia Kayla [name] has a history of some elevated blood glucose readings without a diagnosis of diabetes. She admits to polyphagia.  Depression Screen Kayla [name] Food and Mood (modified PHQ-9) score was  Depression screen PHQ 2/9 12/12/2017  Decreased Interest 2  Down, Depressed, Hopeless 1  PHQ - 2 Score 3  Altered sleeping 3  Tired, decreased energy 3  Change in appetite 3  Feeling bad or failure about yourself  1  Trouble concentrating 1  Moving slowly or fidgety/restless 3  Suicidal thoughts 0  PHQ-9 Score 17  Difficult doing work/chores Very difficult    ALLERGIES: No Known Allergies  MEDICATIONS: No current outpatient medications on file prior to visit.   No current facility-administered medications on file prior to visit.     PAST MEDICAL HISTORY: Past Medical History:  Diagnosis Date  . Anemia   . Back pain   .  Constipation   . Dyspnea   . Fatigue   . Joint pain   . Leg edema   . Obesity   . Sleep apnea    lost weight 2013 gastric sleeve, no longer a problem  . Sleep disturbance   . SVD (spontaneous vaginal delivery)    x 3  . Tobacco use     PAST SURGICAL HISTORY: Past Surgical History:  Procedure Laterality Date  . BILATERAL SALPINGECTOMY Bilateral 09/27/2013   Procedure: BILATERAL SALPINGECTOMY;   Surgeon: Lahoma Crocker, MD;  Location: [facility];  Service: Gynecology;  Laterality: Bilateral;  . CESAREAN SECTION     x 1  . LAPAROTOMY Left 09/27/2013   Procedure: EXPLORATORY LAPAROTOMY W/ EXPLORATION OF LEFT ILLIAC ARTERY & VEIN;  Surgeon: Lahoma Crocker, MD;  Location: [facility];  Service: Gynecology;  Laterality: Left;  . ROBOTIC ASSISTED TOTAL HYSTERECTOMY N/A 09/27/2013   Procedure: ROBOTIC ASSISTED TOTAL HYSTERECTOMY;  Surgeon: Lahoma Crocker, MD;  Location: [facility];  Service: Gynecology;  Laterality: N/A;  . SLEEVE GASTROPLASTY  2013   in [place]    SOCIAL HISTORY: Social History   Tobacco Use  . Smoking status: Current Every Day Smoker    Packs/day: 1.00    Years: 25.00    Pack years: 25.00    Types: Cigarettes  . Smokeless tobacco: Current User  Substance Use Topics  . Alcohol use: Yes    Alcohol/week: 0.0 oz    Comment: socially  . Drug use: No    FAMILY HISTORY: Family History  Problem Relation Age of Onset  . Hypertension Mother   . Colon polyps Mother   . Hyperlipidemia Mother   . Colon cancer Maternal Grandfather 25  . Colon cancer Maternal Aunt        onset in 36s  . Stomach cancer Maternal Aunt   . Leukemia Paternal Grandmother   . Colon polyps Maternal Uncle   . Colon polyps Maternal Aunt     ROS: Review of Systems  Constitutional: Positive for malaise/fatigue.  Respiratory: Positive for shortness of breath (with activity).   Cardiovascular:       Shortness of Breath while Lying Down  Musculoskeletal: Positive for back pain.       Muscle Stiffness Muscle or Joint Pain  Neurological: Positive for weakness and headaches.  Endo/Heme/Allergies:       Positive for hyperglycemia Positive for polyphagia   Psychiatric/Behavioral: The patient has insomnia.     PHYSICAL EXAM: Blood pressure 121/85, pulse 80, temperature 98 F (36.7 C), temperature source Oral, height 5\' 5"  (1.651 m), weight 246 lb (111.6 kg), last menstrual period 08/02/2013,  SpO2 97 %. Body mass index is 40.94 kg/m. Physical Exam  Constitutional: She is oriented to person, place, and time. She appears well-developed and well-nourished.  HENT:  Head: Normocephalic and atraumatic.  Nose: Nose normal.  Eyes: EOM are normal. No scleral icterus.  Neck: Normal range of motion. Neck supple. No thyromegaly present.  Cardiovascular: Normal rate and regular rhythm.  Pulmonary/Chest: Effort normal. No respiratory distress.  Abdominal: Soft. There is no tenderness.  +obesity  Musculoskeletal: Normal range of motion.  Range of Motion normal in all 4 extremities  Neurological: She is alert and oriented to person, place, and time. Coordination normal.  Skin: Skin is warm and dry.  Psychiatric: She has a normal mood and affect. Her behavior is normal.  Vitals reviewed.   RECENT LABS AND TESTS: BMET    Component Value Date/Time   NA 138 12/12/2017 0904  K 4.5 12/12/2017 0904   CL 100 12/12/2017 0904   CO2 24 12/12/2017 0904   GLUCOSE 88 12/12/2017 0904   GLUCOSE 75 03/07/2017 1052   BUN 13 12/12/2017 0904   CREATININE 0.76 12/12/2017 0904   CALCIUM 9.5 12/12/2017 0904   GFRNONAA 92 12/12/2017 0904   GFRAA 107 12/12/2017 0904   Lab Results  Component Value Date   HGBA1C 5.9 (H) 12/12/2017   Lab Results  Component Value Date   INSULIN 9.8 12/12/2017   CBC    Component Value Date/Time   WBC 7.2 12/12/2017 0904   WBC 7.0 03/07/2017 1052   RBC 4.77 12/12/2017 0904   RBC 4.23 03/07/2017 1052   HGB 13.4 12/12/2017 0904   HCT 41.7 12/12/2017 0904   PLT 243.0 03/07/2017 1052   MCV 87 12/12/2017 0904   MCH 28.1 12/12/2017 0904   MCH 28.0 09/28/2013 0520   MCHC 32.1 12/12/2017 0904   MCHC 32.5 03/07/2017 1052   RDW 14.7 12/12/2017 0904   LYMPHSABS 1.9 12/12/2017 0904   EOSABS 0.1 12/12/2017 0904   BASOSABS 0.0 12/12/2017 0904   Iron/TIBC/Ferritin/ %Sat No results found for: IRON, TIBC, FERRITIN, IRONPCTSAT Lipid Panel     Component Value  Date/Time   CHOL 161 12/12/2017 0904   TRIG 98 12/12/2017 0904   HDL 53 12/12/2017 0904   CHOLHDL 2 12/04/2015 1227   VLDL 11.4 12/04/2015 1227   LDLCALC 88 12/12/2017 0904   Hepatic Function Panel     Component Value Date/Time   PROT 7.4 12/12/2017 0904   ALBUMIN 4.6 12/12/2017 0904   AST 14 12/12/2017 0904   ALT 15 12/12/2017 0904   ALKPHOS 55 12/12/2017 0904   BILITOT 0.3 12/12/2017 0904      Component Value Date/Time   TSH 1.190 12/12/2017 0904   TSH 1.621 07/05/2013 1319    ECG  shows NSR with a rate of 85 BPM INDIRECT CALORIMETER done today shows a VO2 of 210 and a REE of 1461.  Her calculated basal metabolic rate is 6144 thus her basal metabolic rate is worse than expected.    ASSESSMENT AND PLAN: Other fatigue - Plan: EKG 12-Lead, Vitamin B12, CBC With Differential, Folate, Lipid Panel With LDL/HDL Ratio, T3, T4, free, TSH, VITAMIN D 25 Hydroxy (Vit-D Deficiency, Fractures), EKG 12-Lead  Shortness of breath on exertion - Plan: CBC With Differential  Hyperglycemia - Plan: Comprehensive metabolic panel, Hemoglobin A1c, Insulin, random  Depression screening  At risk for heart disease  Class 3 severe obesity with serious comorbidity and body mass index (BMI) of 40.0 to 44.9 in adult, unspecified obesity type (HCC)  PLAN: Fatigue Kayla [name] was informed that her fatigue may be related to obesity, depression or many other causes. Labs will be ordered, and in the meanwhile Kayla [name] has agreed to work on diet, exercise and weight loss to help with fatigue. Proper sleep hygiene was discussed including the need for 7-8 hours of quality sleep each night. A sleep study was not ordered based on symptoms and Epworth score.  Dyspnea on exertion Kayla [name] shortness of breath appears to be obesity related and exercise induced. She has agreed to work on weight loss and gradually increase exercise to treat her exercise induced shortness of breath. If Nishi follows our instructions and  loses weight without improvement of her shortness of breath, we will plan to refer to pulmonology. We will monitor this condition regularly. Kayla [name] agrees to this plan.  Hyperglycemia Fasting labs will be obtained and results with  be discussed with Kayla [name] in 2 weeks at her follow up visit. In the meanwhile Kayla [name] was started on a lower simple carbohydrate diet and will work on weight loss efforts.  Cardiovascular risk counseling Kayla [name] was given extended (15 minutes) coronary artery disease prevention counseling today. She is 47 y.o. female and has risk factors for heart disease including obesity. We discussed intensive lifestyle modifications today with an emphasis on specific weight loss instructions and strategies. Pt was also informed of the importance of increasing exercise and decreasing saturated fats to help prevent heart disease.  Depression Screen Kayla [name] had a strongly positive depression screening. Depression is commonly associated with obesity and often results in emotional eating behaviors. We will monitor this closely and work on CBT to help improve the non-hunger eating patterns. Referral to Psychology may be required if no improvement is seen as she continues in our clinic.  Obesity Kayla [name] is currently in the action stage of change and her goal is to continue with weight loss efforts. I recommend Peytin begin the structured treatment plan as follows:  She has agreed to follow the Category 2 plan Nafeesa has been instructed to eventually work up to a goal of 150 minutes of combined cardio and strengthening exercise per week for weight loss and overall health benefits. We discussed the following Behavioral Modification Strategies today: no skipping meals, increasing lean protein intake and decreasing simple carbohydrates   Patient referred to [facility] to see what options she has. She will start with our program in the meanwhile.   She was informed of the importance of frequent  follow up visits to maximize her success with intensive lifestyle modifications for her multiple health conditions. She was informed we would discuss her lab results at her next visit unless there is a critical issue that needs to be addressed sooner. Herberta agreed to keep her next visit at the agreed upon time to discuss these results.    OBESITY BEHAVIORAL INTERVENTION VISIT  Today's visit was # 1 out of 22.  Starting weight: 246 lbs Starting date: 12/12/17 Today's weight : 246 lbs  Today's date: 12/12/2017 Total lbs lost to date: 0 (Patients must lose 7 lbs in the first 6 months to continue with counseling)   ASK: We discussed the diagnosis of obesity with Almedia Balls today and Haily agreed to give [place] permission to discuss obesity behavioral modification therapy today.  ASSESS: Vallerie has the diagnosis of obesity and her BMI today is 40.94 Hania is in the action stage of change   ADVISE: Yarelis was educated on the multiple health risks of obesity as well as the benefit of weight loss to improve her health. She was advised of the need for long term treatment and the importance of lifestyle modifications.  AGREE: Multiple dietary modification options and treatment options were discussed and  Evany agreed to the above obesity treatment plan.   I, Doreene Nest, am acting as transcriptionist for  Dennard Nip, MD   I have reviewed the above documentation for accuracy and completeness, and I agree with the above. -Dennard Nip, MD

## 2017-12-22 ENCOUNTER — Encounter (INDEPENDENT_AMBULATORY_CARE_PROVIDER_SITE_OTHER): Payer: Self-pay | Admitting: Family Medicine

## 2017-12-26 ENCOUNTER — Ambulatory Visit (INDEPENDENT_AMBULATORY_CARE_PROVIDER_SITE_OTHER): Payer: 59 | Admitting: Family Medicine

## 2017-12-26 ENCOUNTER — Ambulatory Visit (INDEPENDENT_AMBULATORY_CARE_PROVIDER_SITE_OTHER): Payer: Self-pay | Admitting: Family Medicine

## 2017-12-26 ENCOUNTER — Ambulatory Visit: Payer: 59 | Admitting: Family Medicine

## 2017-12-26 ENCOUNTER — Encounter (INDEPENDENT_AMBULATORY_CARE_PROVIDER_SITE_OTHER): Payer: Self-pay

## 2017-12-26 VITALS — BP 112/78 | HR 83 | Temp 97.5°F | Ht 65.0 in | Wt 240.0 lb

## 2017-12-26 DIAGNOSIS — R7303 Prediabetes: Secondary | ICD-10-CM | POA: Diagnosis not present

## 2017-12-26 DIAGNOSIS — Z6841 Body Mass Index (BMI) 40.0 and over, adult: Secondary | ICD-10-CM

## 2017-12-26 DIAGNOSIS — Z9189 Other specified personal risk factors, not elsewhere classified: Secondary | ICD-10-CM

## 2017-12-26 DIAGNOSIS — E559 Vitamin D deficiency, unspecified: Secondary | ICD-10-CM

## 2017-12-26 DIAGNOSIS — Z0289 Encounter for other administrative examinations: Secondary | ICD-10-CM

## 2017-12-26 MED ORDER — VITAMIN D (ERGOCALCIFEROL) 1.25 MG (50000 UNIT) PO CAPS: 50000.0000 [IU] | ORAL_CAPSULE | ORAL | 0 refills | Status: DC

## 2017-12-26 NOTE — Progress Notes (Signed)
Office: [phone]  /  Fax: [phone]   HPI:   Chief Complaint: OBESITY Kayla [name] is here to discuss her progress with her obesity treatment plan. She is on the Category 2 plan and is following her eating plan approximately 10 % of the time. She states she is exercising 0 minutes 0 times per week. Kayla [name] did well with weight loss, but she didn't follow her eating plan. She skipped breakfast and lunch, and she had a microwave meal for dinner; then snacked the rest of the evening. Her weight is 240 lb (108.9 kg) today and has had a weight loss of 6 pounds over a period of 2 weeks since her last visit. She has lost 6 lbs since starting treatment with [place].  Vitamin D deficiency Kayla [name] has a diagnosis of vitamin D deficiency. She is not currently taking vit D. Kayla [name] has a positive history of osteoporosis, per patient. Kayla [name] denies nausea, vomiting or muscle weakness.  Pre-Diabetes Kayla [name] has a diagnosis of prediabetes based on her elevated Hgb A1c and was informed this puts her at greater risk of developing diabetes. Kayla [name] notes increased night time polyphagia and she is not taking metformin. Kayla [name] continues to work on diet and exercise to decrease risk of diabetes. She denies nausea or hypoglycemia.  At risk for diabetes Kayla [name] is at higher than average risk for developing diabetes due to her obesity and prediabetes. She currently denies polyuria or polydipsia.  ALLERGIES: No Known Allergies  MEDICATIONS: No current outpatient medications on file prior to visit.   No current facility-administered medications on file prior to visit.     PAST MEDICAL HISTORY: Past Medical History:  Diagnosis Date  . Anemia   . Back pain   . Constipation   . Dyspnea   . Fatigue   . Joint pain   . Leg edema   . Obesity   . Sleep apnea    lost weight 2013 gastric sleeve, no longer a problem  . Sleep disturbance   . SVD (spontaneous vaginal delivery)    x 3  . Tobacco use     PAST SURGICAL  HISTORY: Past Surgical History:  Procedure Laterality Date  . BILATERAL SALPINGECTOMY Bilateral 09/27/2013   Procedure: BILATERAL SALPINGECTOMY;  Surgeon: Lahoma Crocker, MD;  Location: [facility];  Service: Gynecology;  Laterality: Bilateral;  . CESAREAN SECTION     x 1  . LAPAROTOMY Left 09/27/2013   Procedure: EXPLORATORY LAPAROTOMY W/ EXPLORATION OF LEFT ILLIAC ARTERY & VEIN;  Surgeon: Lahoma Crocker, MD;  Location: [facility];  Service: Gynecology;  Laterality: Left;  . ROBOTIC ASSISTED TOTAL HYSTERECTOMY N/A 09/27/2013   Procedure: ROBOTIC ASSISTED TOTAL HYSTERECTOMY;  Surgeon: Lahoma Crocker, MD;  Location: [facility];  Service: Gynecology;  Laterality: N/A;  . SLEEVE GASTROPLASTY  2013   in [place]    SOCIAL HISTORY: Social History   Tobacco Use  . Smoking status: Current Every Day Smoker    Packs/day: 1.00    Years: 25.00    Pack years: 25.00    Types: Cigarettes  . Smokeless tobacco: Current User  Substance Use Topics  . Alcohol use: Yes    Alcohol/week: 0.0 standard drinks    Comment: socially  . Drug use: No    FAMILY HISTORY: Family History  Problem Relation Age of Onset  . Hypertension Mother   . Colon polyps Mother   . Hyperlipidemia Mother   . Colon cancer Maternal Grandfather 51  . Colon cancer Maternal Aunt  onset in 57s  . Stomach cancer Maternal Aunt   . Leukemia Paternal Grandmother   . Colon polyps Maternal Uncle   . Colon polyps Maternal Aunt     ROS: Review of Systems  Constitutional: Positive for weight loss.  Gastrointestinal: Negative for nausea and vomiting.  Genitourinary: Negative for frequency.  Musculoskeletal:       Negative for muscle weakness  Endo/Heme/Allergies: Negative for polydipsia.       Positive for polyphagia Negative for hypoglycemia    PHYSICAL EXAM: Blood pressure 112/78, pulse 83, temperature (!) 97.5 F (36.4 C), temperature source Oral, height 5\' 5"  (1.651 m), weight 240 lb (108.9 kg), last menstrual period  08/02/2013, SpO2 99 %. Body mass index is 39.94 kg/m. Physical Exam  Constitutional: She is oriented to person, place, and time. She appears well-developed and well-nourished.  Cardiovascular: Normal rate.  Pulmonary/Chest: Effort normal.  Musculoskeletal: Normal range of motion.  Neurological: She is oriented to person, place, and time.  Skin: Skin is warm and dry.  Psychiatric: She has a normal mood and affect. Her behavior is normal.  Vitals reviewed.   RECENT LABS AND TESTS: BMET    Component Value Date/Time   NA 138 12/12/2017 0904   K 4.5 12/12/2017 0904   CL 100 12/12/2017 0904   CO2 24 12/12/2017 0904   GLUCOSE 88 12/12/2017 0904   GLUCOSE 75 03/07/2017 1052   BUN 13 12/12/2017 0904   CREATININE 0.76 12/12/2017 0904   CALCIUM 9.5 12/12/2017 0904   GFRNONAA 92 12/12/2017 0904   GFRAA 107 12/12/2017 0904   Lab Results  Component Value Date   HGBA1C 5.9 (H) 12/12/2017   HGBA1C 6.1 09/25/2017   HGBA1C 5.9 03/07/2017   HGBA1C 5.7 12/04/2015   HGBA1C 5.4 11/27/2014   Lab Results  Component Value Date   INSULIN 9.8 12/12/2017   CBC    Component Value Date/Time   WBC 7.2 12/12/2017 0904   WBC 7.0 03/07/2017 1052   RBC 4.77 12/12/2017 0904   RBC 4.23 03/07/2017 1052   HGB 13.4 12/12/2017 0904   HCT 41.7 12/12/2017 0904   PLT 243.0 03/07/2017 1052   MCV 87 12/12/2017 0904   MCH 28.1 12/12/2017 0904   MCH 28.0 09/28/2013 0520   MCHC 32.1 12/12/2017 0904   MCHC 32.5 03/07/2017 1052   RDW 14.7 12/12/2017 0904   LYMPHSABS 1.9 12/12/2017 0904   EOSABS 0.1 12/12/2017 0904   BASOSABS 0.0 12/12/2017 0904   Iron/TIBC/Ferritin/ %Sat No results found for: IRON, TIBC, FERRITIN, IRONPCTSAT Lipid Panel     Component Value Date/Time   CHOL 161 12/12/2017 0904   TRIG 98 12/12/2017 0904   HDL 53 12/12/2017 0904   CHOLHDL 2 12/04/2015 1227   VLDL 11.4 12/04/2015 1227   LDLCALC 88 12/12/2017 0904   Hepatic Function Panel     Component Value Date/Time   PROT 7.4  12/12/2017 0904   ALBUMIN 4.6 12/12/2017 0904   AST 14 12/12/2017 0904   ALT 15 12/12/2017 0904   ALKPHOS 55 12/12/2017 0904   BILITOT 0.3 12/12/2017 0904      Component Value Date/Time   TSH 1.190 12/12/2017 0904   TSH 1.621 07/05/2013 1319   Results for ANJULIE, DIPIERRO (MRN [id number]) as of 12/26/2017 17:03  Ref. Range 12/12/2017 09:04  Vitamin D, 25-Hydroxy Latest Ref Range: 30.0 - 100.0 ng/mL 30.6   ASSESSMENT AND PLAN: Vitamin D deficiency - Plan: Vitamin D, Ergocalciferol, (DRISDOL) 50000 units CAPS capsule  Prediabetes  At  risk for diabetes mellitus  Class 3 severe obesity with serious comorbidity and body mass index (BMI) of 40.0 to 44.9 in adult, unspecified obesity type (Kayla [name])  PLAN:  Vitamin D Deficiency Kayla [name] was informed that low vitamin D levels contributes to fatigue and are associated with obesity, breast, and colon cancer. She agrees to start prescription Vit D @50 ,000 IU every week #4 with no refills and increase calcium in her diet. She will follow up for routine testing of vitamin D, at least 2-3 times per year. She was informed of the risk of over-replacement of vitamin D and agrees to not increase her dose unless she discusses this with [place] first. Kayla [name] agrees to follow up as directed.  Pre-Diabetes Kayla [name] will get back to a proper diet. She will continue to work on weight loss, exercise, and decreasing simple carbohydrates in her diet to help decrease the risk of diabetes. We will defer metformin for now and will follow her closely. She was informed that eating too many simple carbohydrates or too many calories at one sitting increases the likelihood of GI side effects. Merit agreed to follow up with [place] as directed to monitor her progress.  Diabetes risk counseling Kayla [name] was given extended (30 minutes) diabetes prevention counseling today. She is 47 y.o. female and has risk factors for diabetes including obesity and prediabetes. We discussed intensive lifestyle  modifications today with an emphasis on weight loss as well as increasing exercise and decreasing simple carbohydrates in her diet.  Obesity Kayla [name] is currently in the action stage of change. As such, her goal is to continue with weight loss efforts She has agreed to follow the Category 2 plan Kayla [name] has been instructed to work up to a goal of 150 minutes of combined cardio and strengthening exercise per week for weight loss and overall health benefits. We discussed the following Behavioral Modification Strategies today: no skipping meals, increasing lean protein intake and work on meal planning and easy cooking plans Kayla [name] was educated that not all weight loss is healthy and she is likely dropping her metabolic rate; and this will cause rapid weight gain.  Kayla [name] has agreed to follow up with our clinic in 2 to 3 weeks. She was informed of the importance of frequent follow up visits to maximize her success with intensive lifestyle modifications for her multiple health conditions.   OBESITY BEHAVIORAL INTERVENTION VISIT  Today's visit was # 2   Starting weight: 246 lbs Starting date: 12/12/17 Today's weight : 240 lbs Today's date: 12/26/2017 Total lbs lost to date: 6 At least 15 minutes were spent on discussing the following behavioral intervention visit.   ASK: We discussed the diagnosis of obesity with Kayla [name] today and Kayla [name] agreed to give [place] permission to discuss obesity behavioral modification therapy today.  ASSESS: Kayla [name] has the diagnosis of obesity and her BMI today is 39.94 Kayla [name] is in the action stage of change   ADVISE: Kayla [name] was educated on the multiple health risks of obesity as well as the benefit of weight loss to improve her health. She was advised of the need for long term treatment and the importance of lifestyle modifications to improve her current health and to decrease her risk of future health problems.  AGREE: Multiple dietary modification options and  treatment options were discussed and  Kayla [name] agreed to follow the recommendations documented in the above note.  ARRANGE: Kayla [name] was educated on the importance of frequent visits to treat obesity as outlined per CMS and  USPSTF guidelines and agreed to schedule her next follow up appointment today.  I, Doreene Nest, am acting as transcriptionist for Dennard Nip, MD  I have reviewed the above documentation for accuracy and completeness, and I agree with the above. -Dennard Nip, MD

## 2017-12-27 MED FILL — VIT D2 1.25 MG (50,000 UNIT: 1.25 MG | 28 days supply | Qty: 4 | Fill #0

## 2018-01-01 MED FILL — CHANTIX 1 MG CONT MONTH BOX: 1 | 28 days supply | Qty: 56 | Fill #1

## 2018-01-15 ENCOUNTER — Encounter (INDEPENDENT_AMBULATORY_CARE_PROVIDER_SITE_OTHER): Payer: Self-pay

## 2018-01-15 ENCOUNTER — Ambulatory Visit (INDEPENDENT_AMBULATORY_CARE_PROVIDER_SITE_OTHER): Payer: 59 | Admitting: Family Medicine

## 2018-01-15 DIAGNOSIS — M25562 Pain in left knee: Secondary | ICD-10-CM | POA: Diagnosis not present

## 2018-01-16 ENCOUNTER — Ambulatory Visit (INDEPENDENT_AMBULATORY_CARE_PROVIDER_SITE_OTHER): Payer: 59 | Admitting: Family Medicine

## 2018-02-07 ENCOUNTER — Encounter (HOSPITAL_COMMUNITY): Payer: Self-pay | Admitting: *Deleted

## 2018-02-07 ENCOUNTER — Emergency Department (HOSPITAL_COMMUNITY)
Admission: EM | Admit: 2018-02-07 | Discharge: 2018-02-07 | Disposition: A | Discharge status: Home / Self Care | Payer: 59 | Attending: Emergency Medicine | Admitting: Emergency Medicine

## 2018-02-07 DIAGNOSIS — F17210 Nicotine dependence, cigarettes, uncomplicated: Secondary | ICD-10-CM | POA: Insufficient documentation

## 2018-02-07 DIAGNOSIS — R0981 Nasal congestion: Secondary | ICD-10-CM | POA: Diagnosis not present

## 2018-02-07 DIAGNOSIS — R42 Dizziness and giddiness: Secondary | ICD-10-CM | POA: Diagnosis not present

## 2018-02-07 DIAGNOSIS — R55 Syncope and collapse: Secondary | ICD-10-CM | POA: Insufficient documentation

## 2018-02-07 DIAGNOSIS — Z79899 Other long term (current) drug therapy: Secondary | ICD-10-CM | POA: Insufficient documentation

## 2018-02-07 LAB — URINALYSIS, ROUTINE W REFLEX MICROSCOPIC
Bilirubin Urine: NEGATIVE
Glucose, UA: NEGATIVE mg/dL
Hgb urine dipstick: NEGATIVE
KETONES UR: 5 mg/dL — AB
Nitrite: NEGATIVE
Protein, ur: NEGATIVE mg/dL
Specific Gravity, Urine: 1.026 (ref 1.005–1.030)
pH: 5 (ref 5.0–8.0)

## 2018-02-07 LAB — CBC
HCT: 40.4 % (ref 36.0–46.0)
Hemoglobin: 12.3 g/dL (ref 12.0–15.0)
MCH: 27.5 pg (ref 26.0–34.0)
MCHC: 30.4 g/dL (ref 30.0–36.0)
MCV: 90.4 fL (ref 78.0–100.0)
Platelets: 252 10*3/uL (ref 150–400)
RBC: 4.47 MIL/uL (ref 3.87–5.11)
RDW: 13.2 % (ref 11.5–15.5)
WBC: 7.2 10*3/uL (ref 4.0–10.5)

## 2018-02-07 LAB — I-STAT TROPONIN, ED: TROPONIN I, POC: 0.01 ng/mL (ref 0.00–0.08)

## 2018-02-07 LAB — BASIC METABOLIC PANEL
ANION GAP: 11 (ref 5–15)
BUN: 13 mg/dL (ref 6–20)
CALCIUM: 8.7 mg/dL — AB (ref 8.9–10.3)
CO2: 25 mmol/L (ref 22–32)
CREATININE: 0.71 mg/dL (ref 0.44–1.00)
Chloride: 103 mmol/L (ref 98–111)
Glucose, Bld: 115 mg/dL — ABNORMAL HIGH (ref 70–99)
Potassium: 3.6 mmol/L (ref 3.5–5.1)
SODIUM: 139 mmol/L (ref 135–145)

## 2018-02-07 LAB — CBG MONITORING, ED: GLUCOSE-CAPILLARY: 99 mg/dL (ref 70–99)

## 2018-02-07 MED ORDER — SODIUM CHLORIDE 0.9 % IV BOLUS
1000.0000 mL | Freq: Once | INTRAVENOUS | Status: AC
  Administered 2018-02-07: 1000 mL via INTRAVENOUS

## 2018-02-07 NOTE — ED Notes (Signed)
Patient verbalizes understanding of discharge instructions. Opportunity for questioning and answers were provided. Armband removed by staff, pt discharged from ED.  

## 2018-02-07 NOTE — ED Triage Notes (Signed)
Pt in after near syncopal episode, states she has been feeling dizzy and then felt like she was going to pass out, assisted to ground by a friend, states she did take her wellbutrin on an empty stomach which is not normal for her, pt felt better after drinking some cheerwine this morning, pt noted to be pale, alert and oriented, denies dizziness at this time

## 2018-02-07 NOTE — ED Provider Notes (Signed)
[facility] EMERGENCY DEPARTMENT Provider Note   CSN: [id number] Arrival date & time: 02/07/18  0803     History   Chief Complaint Chief Complaint  Patient presents with  . Near Syncope    HPI Kayla [name] is a 47 y.o. female with a past medical history significant for anemia chronic tobacco use who presents for evaluation of near syncopal episode.  Per patient she states she was waiting in line to clock in at work.  States that she was standing talking with her friends, fell "like a severe hot flash" and felt lightheaded and dizzy.  States her friends got her a chair and a soda.  Patient states she felt better after she sat down and drank a soda.  States she did not eat or drink anything this morning prior to taking her medicines.  She did recently restart on her Wellbutrin medicine.  States she has been out for a couple weeks.  Denies fever, chills, nausea, vomiting, chest pain, shortness of breath, abdominal pain, dysuria, diarrhea or constipation, headache, facial asymmetry, numbness, tingling, speech difficulty, unilateral weakness..  States she does have a history of chronic anemia however has not had any issues recently with this.  She is never had any lightheaded episodes like this prior denies history of heart problems or prior CVA.  She states she has recently felt like she was getting a sinus infection with nasal congestion over the last 3 days is not been eating and drinking like she "should be."  HPI  Past Medical History:  Diagnosis Date  . Anemia   . Back pain   . Constipation   . Dyspnea   . Fatigue   . Joint pain   . Leg edema   . Obesity   . Sleep apnea    lost weight 2013 gastric sleeve, no longer a problem  . Sleep disturbance   . SVD (spontaneous vaginal delivery)    x 3  . Tobacco use     Patient Active Problem List   Diagnosis Date Noted  . Other fatigue 12/12/2017  . Shortness of breath on exertion 12/12/2017  . Greater  trochanteric bursitis of right hip 12/15/2015  . Tobacco use disorder 11/07/2014  . Obesity 11/07/2014    Past Surgical History:  Procedure Laterality Date  . BILATERAL SALPINGECTOMY Bilateral 09/27/2013   Procedure: BILATERAL SALPINGECTOMY;  Surgeon: Lahoma Crocker, MD;  Location: [facility];  Service: Gynecology;  Laterality: Bilateral;  . CESAREAN SECTION     x 1  . LAPAROTOMY Left 09/27/2013   Procedure: EXPLORATORY LAPAROTOMY W/ EXPLORATION OF LEFT ILLIAC ARTERY & VEIN;  Surgeon: Lahoma Crocker, MD;  Location: [facility];  Service: Gynecology;  Laterality: Left;  . ROBOTIC ASSISTED TOTAL HYSTERECTOMY N/A 09/27/2013   Procedure: ROBOTIC ASSISTED TOTAL HYSTERECTOMY;  Surgeon: Lahoma Crocker, MD;  Location: [facility];  Service: Gynecology;  Laterality: N/A;  . SLEEVE GASTROPLASTY  2013   in [place]     OB History    Gravida  4   Para  4   Term  4   Preterm      AB      Living  3     SAB      TAB      Ectopic      Multiple      Live Births  4            Home Medications    Prior to Admission medications   Medication  Sig Start Date End Date Taking? Authorizing Provider  buPROPion (WELLBUTRIN XL) 150 MG 24 hr tablet Take 150 mg by mouth daily.   Yes [provider]  Vitamin D, Ergocalciferol, (DRISDOL) 50000 units CAPS capsule Take 1 capsule (50,000 Units total) by mouth every 7 (seven) days. 12/26/17  Yes Dennard Nip D, MD    Family History Family History  Problem Relation Age of Onset  . Hypertension Mother   . Colon polyps Mother   . Hyperlipidemia Mother   . Colon cancer Maternal Grandfather 69  . Colon cancer Maternal Aunt        onset in 76s  . Stomach cancer Maternal Aunt   . Leukemia Paternal Grandmother   . Colon polyps Maternal Uncle   . Colon polyps Maternal Aunt     Social History Social History   Tobacco Use  . Smoking status: Current Every Day Smoker    Packs/day: 1.00    Years: 25.00    Pack years: 25.00    Types:  Cigarettes  . Smokeless tobacco: Current User  Substance Use Topics  . Alcohol use: Yes    Alcohol/week: 0.0 standard drinks    Comment: socially  . Drug use: No     Allergies   Patient has no known allergies.   Review of Systems Review of Systems  Constitutional: Negative for activity change, appetite change, chills, diaphoresis, fatigue, fever and unexpected weight change.  HENT: Positive for congestion, sinus pressure and sinus pain. Negative for dental problem, facial swelling, postnasal drip, rhinorrhea, sore throat, tinnitus, trouble swallowing and voice change.   Respiratory: Negative for cough, choking, chest tightness, shortness of breath, wheezing and stridor.   Cardiovascular: Negative for chest pain, palpitations and leg swelling.  Gastrointestinal: Negative for abdominal pain.  Neurological: Positive for dizziness. Negative for tremors, seizures, syncope, facial asymmetry, speech difficulty, weakness, light-headedness, numbness and headaches.     Physical Exam Updated Vital Signs BP (!) 130/119   Pulse 84   Temp 98 F (36.7 C) (Oral)   Resp 20   LMP 08/02/2013   SpO2 99%   Physical Exam  Physical Exam  Constitutional: Pt is oriented to person, place, and time. Pt appears well-developed and well-nourished. No distress.  HENT:  Head: Normocephalic and atraumatic.  Mouth/Throat: Oropharynx is clear and moist.  Eyes: Conjunctivae and EOM are normal. Pupils are equal, round, and reactive to light. No scleral icterus.  No horizontal, vertical or rotational nystagmus  Neck: Normal range of motion. Neck supple.  Full active and passive ROM without pain No midline or paraspinal tenderness No nuchal rigidity or meningeal signs  Cardiovascular: Normal rate, regular rhythm and intact distal pulses.   Pulmonary/Chest: Effort normal and breath sounds normal. No respiratory distress. Pt has no wheezes. No rales.  Abdominal: Soft. Bowel sounds are normal. There is no  tenderness. There is no rebound and no guarding.  Musculoskeletal: Normal range of motion.  Lymphadenopathy:    No cervical adenopathy.  Neurological: Pt. is alert and oriented to person, place, and time. He has normal reflexes. No cranial nerve deficit.  Exhibits normal muscle tone. Coordination normal.  Mental Status:  Alert, oriented, thought content appropriate. Speech fluent without evidence of aphasia. Able to follow 2 step commands without difficulty.  Cranial Nerves:  II:  Peripheral visual fields grossly normal, pupils equal, round, reactive to light III,IV, VI: ptosis not present, extra-ocular motions intact bilaterally  V,VII: smile symmetric, facial light touch sensation equal VIII: hearing grossly normal bilaterally  IX,X: midline uvula rise  XI: bilateral shoulder shrug equal and strong XII: midline tongue extension  Motor:  5/5 in upper and lower extremities bilaterally including strong and equal grip strength and dorsiflexion/plantar flexion Sensory: Pinprick and light touch normal in all extremities.  Deep Tendon Reflexes: 2+ and symmetric  Cerebellar: normal finger-to-nose with bilateral upper extremities Gait: normal gait and balance CV: distal pulses palpable throughout   Skin: Skin is warm and dry. No rash noted. Pt is not diaphoretic.  Psychiatric: Pt has a normal mood and affect. Behavior is normal. Judgment and thought content normal.  Nursing note and vitals reviewed. ED Treatments / Results  Labs (all labs ordered are listed, but only abnormal results are displayed) Labs Reviewed  BASIC METABOLIC PANEL - Abnormal; Notable for the following components:      Result Value   Glucose, Bld 115 (*)    Calcium 8.7 (*)    All other components within normal limits  URINALYSIS, ROUTINE W REFLEX MICROSCOPIC - Abnormal; Notable for the following components:   Color, Urine AMBER (*)    APPearance CLOUDY (*)    Ketones, ur 5 (*)    Leukocytes, UA TRACE (*)     Bacteria, UA RARE (*)    All other components within normal limits  CBC  TROPONIN I  CBG MONITORING, ED  I-STAT TROPONIN, ED    EKG EKG Interpretation  Date/Time:  Wednesday February 07 2018 08:11:26 EDT Ventricular Rate:  100 PR Interval:  172 QRS Duration: 82 QT Interval:  350 QTC Calculation: 451 R Axis:   83 Text Interpretation:  Normal sinus rhythm Normal ECG No previous ECGs available Confirmed by Theotis Burrow [phone]) on 02/07/2018 9:03:49 AM   Radiology No results found.  Procedures Procedures (including critical care time)  Medications Ordered in ED Medications  sodium chloride 0.9 % bolus 1,000 mL (0 mLs Intravenous Stopped 02/07/18 1201)     Initial Impression / Assessment and Plan / ED Course  I have reviewed the triage vital signs and the nursing notes pulse past medical history.  Pertinent labs & imaging results that were available during my care of the patient were reviewed by me and considered in my medical decision making (see chart for details).  47 year old female who appears otherwise well presents for evaluation of near syncope.  Has recently been sick and has not been eating and drinking like normal.  Recently restarted Wellbutrin after being off for 2 to 3 weeks.  Nonfocal neuro exam without neuro deficits.  On initial exam patient states that she feels fine would like to go home.  Discussed with patient lab work that is resulted that was placed in triage.  She is mildly hypotensive and mildly tachycardic with heart rate at 97.  Feel patient's BP is most likely volume related as she is dehydrated. Discussed IV fluid and orthostatic vital signs with patient.  Patient is agreement with plan will reassess.   Reevaluation patient states that she would like to go home. Orthostatic negative. Discussed completion of her IV fluids and obtain orthostatic vital signs.  Patient in agreement with plan.  Feel patient's episode of dizziness most likely result of  dehydration and not eating.  Labs without leukocytosis.  Troponin negative, EKG NSR.  Focal neuro exam without neuro deficits.  Urine with trace leukocytes, however patient is asymptomatic.  Do not feel patient needs treatment at this time.  Patient patient is requesting DC home.  She is ambulating in the room without any  difficulty. Patient without arrhythmia while here in the department.  Patient without history of congestive heart failure, normal hematocrit, normal ECG, no shortness of breath and systolic blood pressure greater than 90; patient is low risk. Will plan for discharge home with close cardiology/PCP follow-up.  Possibility of recurrent syncope has been discussed. I discussed reasons to avoid driving until cardiology/PCP followup and other safety prevention including use of ladders and working at heights.   Pt has remained hemodynamically stable throughout their time in the ED.     Final Clinical Impressions(s) / ED Diagnoses   Final diagnoses:  Dizziness    ED Discharge Orders    None       Raihana Balderrama A, PA-C 02/07/18 1228    Little, Wenda Overland, MD 02/07/18 1257

## 2018-02-07 NOTE — Discharge Instructions (Signed)
You were evaluated today for dizziness.  Your work up was negative. I feel your dizziness is related to dehydration. Please continue to increase your oral intake.  Please follow-up with Cardiology is you have recurrent symptoms. Return to the ED with new or worsening symptoms such as: Get help right away if: You throw up (vomit) or have watery poop (diarrhea), and you cannot eat or drink anything. You have trouble: Talking. Walking. Swallowing. Using your arms, hands, or legs. You feel generally weak. You are not thinking clearly, or you have trouble forming sentences. A friend or family member may notice this. You have: Chest pain. Pain in your belly (abdomen). Shortness of breath. Sweating. Your vision changes. You are bleeding. You have a very bad headache. You have neck pain or a stiff neck. You have a fever.

## 2018-02-16 DIAGNOSIS — H5213 Myopia, bilateral: Secondary | ICD-10-CM | POA: Diagnosis not present

## 2018-04-23 ENCOUNTER — Ambulatory Visit: Payer: 59 | Admitting: Family Medicine

## 2018-04-23 ENCOUNTER — Encounter: Payer: Self-pay | Admitting: Family Medicine

## 2018-04-23 VITALS — BP 138/98 | HR 94 | Temp 97.9°F | Ht 65.0 in | Wt 238.2 lb

## 2018-04-23 DIAGNOSIS — F17200 Nicotine dependence, unspecified, uncomplicated: Secondary | ICD-10-CM

## 2018-04-23 DIAGNOSIS — I10 Essential (primary) hypertension: Secondary | ICD-10-CM | POA: Diagnosis not present

## 2018-04-23 DIAGNOSIS — R300 Dysuria: Secondary | ICD-10-CM

## 2018-04-23 DIAGNOSIS — R194 Change in bowel habit: Secondary | ICD-10-CM

## 2018-04-23 LAB — POCT URINALYSIS DIPSTICK
BILIRUBIN UA: NEGATIVE
GLUCOSE UA: NEGATIVE
Ketones, UA: NEGATIVE
LEUKOCYTES UA: NEGATIVE
Nitrite, UA: NEGATIVE
Protein, UA: NEGATIVE
Spec Grav, UA: 1.015 (ref 1.010–1.025)
Urobilinogen, UA: 0.2 E.U./dL
pH, UA: 7.5 (ref 5.0–8.0)

## 2018-04-23 MED ORDER — NITROFURANTOIN MONOHYD MACRO 100 MG PO CAPS: 100.0000 mg | ORAL_CAPSULE | Freq: Two times a day (BID) | ORAL | 0 refills | Status: DC

## 2018-04-23 MED ORDER — HYDROCHLOROTHIAZIDE 12.5 MG PO CAPS: 12.5000 mg | ORAL_CAPSULE | Freq: Every day | ORAL | 1 refills | Status: DC

## 2018-04-23 MED ORDER — BUPROPION HCL ER (XL) 150 MG PO TB24: 150.0000 mg | ORAL_TABLET | Freq: Every day | ORAL | 1 refills | Status: DC

## 2018-04-23 MED FILL — NITROFURANTOIN MONO-MCR 100: 100 | 7 days supply | Qty: 14 | Fill #0

## 2018-04-23 MED FILL — buPROPion HCL ER (XL) 150 M: 150 | 90 days supply | Qty: 90 | Fill #0

## 2018-04-23 MED FILL — HYDROCHLOROTHIAZIDE 12.5 MG: 12.5 | 30 days supply | Qty: 30 | Fill #0

## 2018-04-23 NOTE — Addendum Note (Signed)
Addended by: Agnes Lawrence on: 04/23/2018 09:53 AM   Modules accepted: Orders

## 2018-04-23 NOTE — Progress Notes (Signed)
HPI:  Using dictation device. Unfortunately this device frequently misinterprets words/phrases.  Due for flu vaccine and follow up visit.  Acute visit for Dysuria: -for several weeks -hx recurrent UTIs, was seeing urologist and was on prophylactic medication, reports had biopsy and thorough eval -symptoms: dysuria, frequency, urgency -no fever, blood, NV, flank pain  Many other concerns today. Check blood pressure at home and has been running high for several months. Sometimes has HA with it. Strong FH and smoking, not exercising, diet not great. Was seeing wt management clinic and reports wt down from 245 --> 238, but not going back due to preference. Smoking again - wellbutrin helped and wants refill. Change in bowels for a long time - constipation, then diarrhea when uses fiber supplement. Wants referral to GI as has FH GI cancer and her aunt told her this is how it starts.    PMH tobacco use, hyperglycemia, morbid obesity (seeing [facility]), elevated BP.  ROS: See pertinent positives and negatives per HPI.  Past Medical History:  Diagnosis Date  . Anemia   . Back pain   . Constipation   . Dyspnea   . Fatigue   . Joint pain   . Leg edema   . Obesity   . Sleep apnea    lost weight 2013 gastric sleeve, no longer a problem  . Sleep disturbance   . SVD (spontaneous vaginal delivery)    x 3  . Tobacco use     Past Surgical History:  Procedure Laterality Date  . BILATERAL SALPINGECTOMY Bilateral 09/27/2013   Procedure: BILATERAL SALPINGECTOMY;  Surgeon: Lahoma Crocker, MD;  Location: [facility];  Service: Gynecology;  Laterality: Bilateral;  . CESAREAN SECTION     x 1  . LAPAROTOMY Left 09/27/2013   Procedure: EXPLORATORY LAPAROTOMY W/ EXPLORATION OF LEFT ILLIAC ARTERY & VEIN;  Surgeon: Lahoma Crocker, MD;  Location: [facility];  Service: Gynecology;  Laterality: Left;  . ROBOTIC ASSISTED TOTAL HYSTERECTOMY N/A 09/27/2013   Procedure: ROBOTIC ASSISTED TOTAL  HYSTERECTOMY;  Surgeon: Lahoma Crocker, MD;  Location: [facility];  Service: Gynecology;  Laterality: N/A;  . SLEEVE GASTROPLASTY  2013   in [place]    Family History  Problem Relation Age of Onset  . Hypertension Mother   . Colon polyps Mother   . Hyperlipidemia Mother   . Colon cancer Maternal Grandfather 72  . Colon cancer Maternal Aunt        onset in 71s  . Stomach cancer Maternal Aunt   . Leukemia Paternal Grandmother   . Colon polyps Maternal Uncle   . Colon polyps Maternal Aunt     SOCIAL HX: see hpi   Current Outpatient Medications:  .  buPROPion (WELLBUTRIN XL) 150 MG 24 hr tablet, Take 1 tablet (150 mg total) by mouth daily., Disp: 90 tablet, Rfl: 1 .  Vitamin D, Ergocalciferol, (DRISDOL) 50000 units CAPS capsule, Take 1 capsule (50,000 Units total) by mouth every 7 (seven) days., Disp: 4 capsule, Rfl: 0 .  hydrochlorothiazide (MICROZIDE) 12.5 MG capsule, Take 1 capsule (12.5 mg total) by mouth daily., Disp: 30 capsule, Rfl: 1 .  nitrofurantoin, macrocrystal-monohydrate, (MACROBID) 100 MG capsule, Take 1 capsule (100 mg total) by mouth 2 (two) times daily., Disp: 14 capsule, Rfl: 0  EXAM:  Vitals:   04/23/18 0850  BP: (!) 138/98  Pulse: 94  Temp: 97.9 F (36.6 C)    Body mass index is 39.64 kg/m.  GENERAL: vitals reviewed and listed above, alert, oriented, appears well  hydrated and in no acute distress  HEENT: atraumatic, conjunttiva clear, no obvious abnormalities on inspection of external nose and ears  NECK: no obvious masses on inspection  LUNGS: clear to auscultation bilaterally, no wheezes, rales or rhonchi, good air movement SBD: soft, NTTP  CV: HRRR, no peripheral edema  MS: moves all extremities without noticeable abnormality  PSYCH: pleasant and cooperative, no obvious depression or anxiety  ASSESSMENT AND PLAN:  Discussed the following assessment and plan:  Dysuria - Plan: POC Urinalysis Dipstick  Essential hypertension  Tobacco use  disorder  Bowel habit changes - Plan: Ambulatory referral to Gastroenterology  -udip + hx and symptoms c/w likely UTI - opted for empiric abx and culture pending -discussed options and risks for management HTN - advise tobacco cessation (counseled 3-5 minutes and she wants to try wellbutrin again), healthy diet and exercise - she has [employer] membership so encourage water laps if not comfortable walking. Also after discussion options for meds she wants to try diuretic - rx sent. Follow up 1 month. -discussed potential etiologies constipation and options for treatment. She wants to see GI - referral sent. -follow up 1 months - sooner as needed -Patient advised to return or notify a doctor immediately if symptoms worsen or persist or new concerns arise.  Patient Instructions  BEFORE YOU LEAVE: -send urine culture -follow up: 1 month, labs then  Start the blood pressure medication (hctz) and take once daily  Start the wellbutrin today. Quit smoking in 1 week.  Get 150 minutes of aerobic exercise per week and eat a healthy diet - Mediterranean or DASH.  Take the antibiotic for the urine symptoms. A culture is pending. Follow up with your urologist if further or persistent issues.  -We placed a referral for you as discussed to the gastroenterologist per your request. It usually takes about 1-2 weeks to process and schedule this referral. If you have not heard from [place] regarding this appointment in 2 weeks please contact our office.     [name], DO

## 2018-04-23 NOTE — Patient Instructions (Signed)
BEFORE YOU LEAVE: -send urine culture -follow up: 1 month, labs then  Start the blood pressure medication (hctz) and take once daily  Start the wellbutrin today. Quit smoking in 1 week.  Get 150 minutes of aerobic exercise per week and eat a healthy diet - Mediterranean or DASH.  Take the antibiotic for the urine symptoms. A culture is pending. Follow up with your urologist if further or persistent issues.  -We placed a referral for you as discussed to the gastroenterologist per your request. It usually takes about 1-2 weeks to process and schedule this referral. If you have not heard from [place] regarding this appointment in 2 weeks please contact our office.

## 2018-04-24 LAB — URINE CULTURE
MICRO NUMBER:: 91502207
SPECIMEN QUALITY:: ADEQUATE

## 2018-05-21 NOTE — Progress Notes (Signed)
HPI:  Using dictation device. Unfortunately this device frequently misinterprets words/phrases.  Follow up several issues from December visit:  1) HTN: -strong FH and smoker, poor diet, no exercise, morbid obesity -started hctz 04/2018, also advised healthy diet, regular exercise and smoking cessation -lifestyle unchanged, taking medication and tolerating -feels better, mild lightheaded when anxious  2)Tobacco use: -advised to quit -restarted wellbutrin to help 04/23/18 - she has not quit or started the medication, she has quit date coming up -is motivated to quit  3)irr bowel habits -intermittent constipation -intermittent diarrhea -FH GI cancer  -referred to GI; looks they called her but she did not return call  4)Anxiety and Depression: -increased anxiety lately, on benzo in the past -sees behavioral health at [facility] and has appointment scheduled for this -no SI, thoughts of harm  5)still some dysuria. Did not have UTI but she has return symptoms after abx, I advised urology eval. She wants to check urine today  ROS: See pertinent positives and negatives per HPI.  Past Medical History:  Diagnosis Date  . Anemia   . Back pain   . Constipation   . Dyspnea   . Fatigue   . Joint pain   . Leg edema   . Obesity   . Sleep apnea    lost weight 2013 gastric sleeve, no longer a problem  . Sleep disturbance   . SVD (spontaneous vaginal delivery)    x 3  . Tobacco use     Past Surgical History:  Procedure Laterality Date  . BILATERAL SALPINGECTOMY Bilateral 09/27/2013   Procedure: BILATERAL SALPINGECTOMY;  Surgeon: Lahoma Crocker, MD;  Location: [facility];  Service: Gynecology;  Laterality: Bilateral;  . CESAREAN SECTION     x 1  . LAPAROTOMY Left 09/27/2013   Procedure: EXPLORATORY LAPAROTOMY W/ EXPLORATION OF LEFT ILLIAC ARTERY & VEIN;  Surgeon: Lahoma Crocker, MD;  Location: [facility];  Service: Gynecology;  Laterality: Left;  . ROBOTIC ASSISTED TOTAL HYSTERECTOMY  N/A 09/27/2013   Procedure: ROBOTIC ASSISTED TOTAL HYSTERECTOMY;  Surgeon: Lahoma Crocker, MD;  Location: [facility];  Service: Gynecology;  Laterality: N/A;  . SLEEVE GASTROPLASTY  2013   in [place]    Family History  Problem Relation Age of Onset  . Hypertension Mother   . Colon polyps Mother   . Hyperlipidemia Mother   . Colon cancer Maternal Grandfather 35  . Colon cancer Maternal Aunt        onset in 20s  . Stomach cancer Maternal Aunt   . Leukemia Paternal Grandmother   . Colon polyps Maternal Uncle   . Colon polyps Maternal Aunt     SOCIAL HX: see hpi   Current Outpatient Medications:  Marland Kitchen  Vitamin D, Ergocalciferol, (DRISDOL) 50000 units CAPS capsule, Take 1 capsule (50,000 Units total) by mouth every 7 (seven) days., Disp: 4 capsule, Rfl: 0 .  hydrochlorothiazide (HYDRODIURIL) 25 MG tablet, Take 1 tablet (25 mg total) by mouth daily., Disp: 90 tablet, Rfl: 3  EXAM:  Vitals:   05/24/18 0838  BP: 120/90  Pulse: 91  Temp: 97.8 F (36.6 C)    Body mass index is 40.87 kg/m.  GENERAL: vitals reviewed and listed above, alert, oriented, appears well hydrated and in no acute distress  HEENT: atraumatic, conjunttiva clear, no obvious abnormalities on inspection of external nose and ears  NECK: no obvious masses on inspection  LUNGS: clear to auscultation bilaterally, no wheezes, rales or rhonchi, good air movement  CV: HRRR, no peripheral edema  MS:  moves all extremities without noticeable abnormality  PSYCH: pleasant and cooperative, no obvious depression or anxiety  ASSESSMENT AND PLAN:  Discussed the following assessment and plan:  Essential hypertension  Anxiety and depression  Tobacco use  Dysuria - Plan: Ambulatory referral to Urology, POCT Urinalysis Dipstick (Automated)  Colon cancer screening - Plan: Ambulatory referral to Gastroenterology  -BP improving but still up a little - opted to increase diuretic -smoking cessation counseling and discussed  options, advised agains wellbutrin if a lot of anxiety - she plans to see behavioral health specialist about the anx/dep and follow up here if wants to consider medication (maybe ssri or snri). She wants to try chantix and reports has at home and will call if not. -will need colon cancer screening and she asked for referral now -advised urology eval, she wants to check urine and referral  today - pending -follow up 1 month -Patient advised to return or notify a doctor immediately if symptoms worsen or persist or new concerns arise.  Patient Instructions  BEFORE YOU LEAVE: -referral to urology for ongoing dysuria -udip and reflex if abnormal -needs colon ca screening in 1 week for colonosocpy -follow up: 1 months   Increased the Hydrochlorthiazide to 25 mg daily.  Quit smoking. Let us know if you need chantix prescription.  See your counselor about the anxiety and let us know if you need further help.  I hope you are feeling better soon! Seek care promptly if your symptoms worsen, new concerns arise or you are not improving with treatment.     [name], DO

## 2018-05-24 ENCOUNTER — Ambulatory Visit: Payer: 59 | Admitting: Family Medicine

## 2018-05-24 ENCOUNTER — Encounter: Payer: Self-pay | Admitting: Internal Medicine

## 2018-05-24 ENCOUNTER — Encounter: Payer: Self-pay | Admitting: Family Medicine

## 2018-05-24 VITALS — BP 120/90 | HR 91 | Temp 97.8°F | Ht 65.0 in | Wt 245.6 lb

## 2018-05-24 DIAGNOSIS — F419 Anxiety disorder, unspecified: Secondary | ICD-10-CM

## 2018-05-24 DIAGNOSIS — F329 Major depressive disorder, single episode, unspecified: Secondary | ICD-10-CM

## 2018-05-24 DIAGNOSIS — Z1211 Encounter for screening for malignant neoplasm of colon: Secondary | ICD-10-CM

## 2018-05-24 DIAGNOSIS — R300 Dysuria: Secondary | ICD-10-CM | POA: Diagnosis not present

## 2018-05-24 DIAGNOSIS — I10 Essential (primary) hypertension: Secondary | ICD-10-CM

## 2018-05-24 DIAGNOSIS — Z720 Tobacco use: Secondary | ICD-10-CM | POA: Diagnosis not present

## 2018-05-24 DIAGNOSIS — F32A Depression, unspecified: Secondary | ICD-10-CM

## 2018-05-24 LAB — POC URINALSYSI DIPSTICK (AUTOMATED)
BILIRUBIN UA: NEGATIVE
Blood, UA: NEGATIVE
GLUCOSE UA: NEGATIVE
KETONES UA: NEGATIVE
LEUKOCYTES UA: NEGATIVE
Nitrite, UA: NEGATIVE
PROTEIN UA: NEGATIVE
SPEC GRAV UA: 1.02 (ref 1.010–1.025)
Urobilinogen, UA: 1 E.U./dL
pH, UA: 6.5 (ref 5.0–8.0)

## 2018-05-24 MED ORDER — HYDROCHLOROTHIAZIDE 25 MG PO TABS: 25.0000 mg | ORAL_TABLET | Freq: Every day | ORAL | 3 refills | Status: DC

## 2018-05-24 MED FILL — HYDROCHLOROTHIAZIDE 25 MG T: 25 | 90 days supply | Qty: 90 | Fill #0

## 2018-05-24 NOTE — Patient Instructions (Addendum)
BEFORE YOU LEAVE: -referral to urology for ongoing dysuria -udip and reflex if abnormal -needs colon ca screening in 1 week for colonosocpy -follow up: 1 months   Increased the Hydrochlorthiazide to 25 mg daily.  Quit smoking. Let us know if you need chantix prescription.  See your counselor about the anxiety and let us know if you need further help.  I hope you are feeling better soon! Seek care promptly if your symptoms worsen, new concerns arise or you are not improving with treatment.

## 2018-06-20 ENCOUNTER — Ambulatory Visit (AMBULATORY_SURGERY_CENTER): Payer: 59 | Admitting: *Deleted

## 2018-06-20 DIAGNOSIS — Z8371 Family history of colonic polyps: Secondary | ICD-10-CM

## 2018-06-20 DIAGNOSIS — Z800 Family history of malignant neoplasm of digestive organs: Secondary | ICD-10-CM

## 2018-06-20 NOTE — Progress Notes (Signed)
Pt had a colon 04/2015 with Carlean Purl-  She has a [facility] and a FH colon polyps- she has a 5 yr recall in the system- pt is not due now - due 2021 Explained this to her in [place] today - I had Barb Merino Look over the chart with me to make sure she dd not have a sooner recall and she does not  Pt told me she is having increased GI issues with severe constipation and she requested an OV due to these issues- she uses Miralax daily and still has sev constipation OV made for 2-20 Thursday at 130 pm with PA,P Chester Holstein made with pt in [place]  I cancelled her 2-26 colon with Carlean Purl
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
## 2018-06-28 ENCOUNTER — Ambulatory Visit: Payer: Self-pay | Admitting: Nurse Practitioner

## 2018-07-04 ENCOUNTER — Encounter: Payer: Self-pay | Admitting: Internal Medicine

## 2018-07-30 ENCOUNTER — Encounter: Payer: Self-pay | Admitting: *Deleted

## 2018-07-30 NOTE — Telephone Encounter (Signed)
This encounter was created in error - please disregard.

## 2018-07-31 ENCOUNTER — Other Ambulatory Visit: Payer: Self-pay

## 2018-07-31 ENCOUNTER — Ambulatory Visit: Admitting: Family Medicine

## 2018-07-31 ENCOUNTER — Ambulatory Visit (INDEPENDENT_AMBULATORY_CARE_PROVIDER_SITE_OTHER): Payer: 59 | Admitting: Family Medicine

## 2018-07-31 DIAGNOSIS — R590 Localized enlarged lymph nodes: Secondary | ICD-10-CM

## 2018-07-31 DIAGNOSIS — R0982 Postnasal drip: Secondary | ICD-10-CM

## 2018-07-31 MED ORDER — AMOXICILLIN-POT CLAVULANATE 875-125 MG PO TABS: 1.0000 | ORAL_TABLET | Freq: Two times a day (BID) | ORAL | 0 refills | Status: DC

## 2018-07-31 MED FILL — AMOX-CLAV 875-125 MG TABLET: 875-125 | 7 days supply | Qty: 14 | Fill #0

## 2018-07-31 NOTE — Progress Notes (Signed)
Virtual Visit via Video Note  I connected with Kayla [name] on 07/31/18 at  2:00 PM EDT by a video enabled telemedicine application and verified that I am speaking with the correct person using two identifiers.   Location patient: hair salon Location provider:work or home office Persons participating in the virtual visit: patient, provider  I discussed the limitations of evaluation and management by telemedicine and the availability of in person appointments. The patient expressed understanding and agreed to proceed.   HPI:  Acute virtual visit for swollen gland in neck: -started: yesterday -symptoms: PND, LAD R ant cervical tender approx 1-2 cm in diameter upper R neck -denies: no fevers, SOB, cough, body aches, sore throat -exposures: none, no travel, no known strep/covid/flu exposures or other exposures -med allergies:  No abx allergies -hx of smoking   ROS: See pertinent positives and negatives per HPI.  Past Medical History:  Diagnosis Date  . Anemia   . Back pain   . Constipation   . Dyspnea   . Fatigue   . Joint pain   . Leg edema   . Obesity   . Sleep apnea    lost weight 2013 gastric sleeve, no longer a problem  . Sleep disturbance   . SVD (spontaneous vaginal delivery)    x 3  . Tobacco use     Past Surgical History:  Procedure Laterality Date  . BILATERAL SALPINGECTOMY Bilateral 09/27/2013   Procedure: BILATERAL SALPINGECTOMY;  Surgeon: Lahoma Crocker, MD;  Location: [facility];  Service: Gynecology;  Laterality: Bilateral;  . CESAREAN SECTION     x 1  . LAPAROTOMY Left 09/27/2013   Procedure: EXPLORATORY LAPAROTOMY W/ EXPLORATION OF LEFT ILLIAC ARTERY & VEIN;  Surgeon: Lahoma Crocker, MD;  Location: [facility];  Service: Gynecology;  Laterality: Left;  . ROBOTIC ASSISTED TOTAL HYSTERECTOMY N/A 09/27/2013   Procedure: ROBOTIC ASSISTED TOTAL HYSTERECTOMY;  Surgeon: Lahoma Crocker, MD;  Location: [facility];  Service: Gynecology;  Laterality: N/A;  . SLEEVE  GASTROPLASTY  2013   in [place]    Family History  Problem Relation Age of Onset  . Hypertension Mother   . Colon polyps Mother   . Hyperlipidemia Mother   . Colon cancer Maternal Grandfather 53  . Colon cancer Maternal Aunt        onset in 74s  . Stomach cancer Maternal Aunt   . Leukemia Paternal Grandmother   . Colon polyps Maternal Uncle   . Colon polyps Maternal Aunt     SOCIAL HX: see hpi   Current Outpatient Medications:  .  amoxicillin-clavulanate (AUGMENTIN) 875-125 MG tablet, Take 1 tablet by mouth 2 (two) times daily., Disp: 14 tablet, Rfl: 0 .  hydrochlorothiazide (HYDRODIURIL) 25 MG tablet, Take 1 tablet (25 mg total) by mouth daily., Disp: 90 tablet, Rfl: 3 .  Vitamin D, Ergocalciferol, (DRISDOL) 50000 units CAPS capsule, Take 1 capsule (50,000 Units total) by mouth every 7 (seven) days., Disp: 4 capsule, Rfl: 0  EXAM:  VITALS per patient if applicable: none, reports has been taking temperature and "normal" GENERAL: alert, oriented, appears well and in no acute distress  HEENT: atraumatic, conjunttiva clear, no obvious abnormalities on inspection of external nose and ears  NECK: normal movements of the head and neck, I am not able to appreciate gross swelling, redness or asymmetry on visual exam. Pt points to area in up ant cervical region.  LUNGS: on inspection no signs of respiratory distress, breathing rate appears normal, no obvious gross SOB, gasping  or wheezing  CV: no obvious cyanosis  MS: moves all visible extremities without noticeable abnormality  PSYCH/NEURO: pleasant and cooperative, no obvious depression or anxiety, speech and thought processing grossly intact  ASSESSMENT AND PLAN:  Discussed the following assessment and plan:  LAD (lymphadenopathy) of right cervical region  PND (post-nasal drip)    -we discussed possible serious and likely etiologies, workup and treatment, treatment risks and return precautions -after this discussion, Kayla [name]  opted for trial abx and if swelling remains ENT evaluation. -follow up advised in 2 weeks and sent staff message to my assistant to schedule this. Also advised patient to call us if did not receive a call from my assistant. She agreed. I discussed the assessment and treatment plan with the patient. The patient was provided an opportunity to ask questions and all were answered. The patient agreed with the plan and demonstrated an understanding of the instructions.   The patient was advised to call back or seek an in-person evaluation if the symptoms worsen or if the condition fails to improve as anticipated.  I provided 22 minutes of non-face-to-face time during this encounter.   [name], DO

## 2018-08-01 ENCOUNTER — Telehealth: Payer: Self-pay | Admitting: *Deleted

## 2018-08-01 NOTE — Telephone Encounter (Signed)
I left a detailed message for the pt to call the office to schedule a follow up appt in 2 weeks.  CRM also created.

## 2018-08-01 NOTE — Telephone Encounter (Signed)
-----   Message from [name], DO sent at 07/31/2018  5:43 PM EDT ----- Please schedule Kayla [name] a follow up virtual visit in 2 weeks with me. Thank you.

## 2018-08-01 NOTE — Telephone Encounter (Signed)
Pt called and left a voicemail that she received message and we can call her back to schedule. Pt would like a call back for scheduling.

## 2018-08-01 NOTE — Telephone Encounter (Signed)
I called the pt and scheduled an appt for 4/7 at 11:45am.

## 2018-08-14 ENCOUNTER — Other Ambulatory Visit: Payer: Self-pay

## 2018-08-14 ENCOUNTER — Encounter: Payer: Self-pay | Admitting: Family Medicine

## 2018-08-14 ENCOUNTER — Ambulatory Visit (INDEPENDENT_AMBULATORY_CARE_PROVIDER_SITE_OTHER): Payer: 59 | Admitting: Family Medicine

## 2018-08-14 DIAGNOSIS — I10 Essential (primary) hypertension: Secondary | ICD-10-CM | POA: Diagnosis not present

## 2018-08-14 DIAGNOSIS — R590 Localized enlarged lymph nodes: Secondary | ICD-10-CM

## 2018-08-14 DIAGNOSIS — F419 Anxiety disorder, unspecified: Secondary | ICD-10-CM

## 2018-08-14 DIAGNOSIS — F439 Reaction to severe stress, unspecified: Secondary | ICD-10-CM | POA: Diagnosis not present

## 2018-08-14 DIAGNOSIS — Z720 Tobacco use: Secondary | ICD-10-CM | POA: Diagnosis not present

## 2018-08-14 MED ORDER — PAROXETINE HCL 10 MG PO TABS: 10.0000 mg | ORAL_TABLET | Freq: Every day | ORAL | 2 refills | Status: DC

## 2018-08-14 MED ORDER — AMLODIPINE BESYLATE 2.5 MG PO TABS: 2.5000 mg | ORAL_TABLET | Freq: Every day | ORAL | 3 refills | Status: DC

## 2018-08-14 MED FILL — AMLODIPINE 2.5 MG TABLET: 2.5 | 30 days supply | Qty: 30 | Fill #0

## 2018-08-14 MED FILL — PARoxetine HCL 10 MG TABS: 10 | 30 days supply | Qty: 30 | Fill #0

## 2018-08-14 NOTE — Progress Notes (Signed)
Virtual Visit via Video Note  I connected with [facility] on 08/14/18 at 11:45 AM EDT by a video enabled telemedicine application and verified that I am speaking with the correct person using two identifiers.  Location patient: home Location provider:work or home office Persons participating in the virtual visit: patient, provider, patients son  I discussed the limitations of evaluation and management by telemedicine and the availability of in person appointments. The patient expressed understanding and agreed to proceed.   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 She feel the anxiety and stress is raising her BP. 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 -quit smoking cigarettes 2 weeks ago, craving -she has a therapist and sees regula and has been talking to therapist for a long time and during pandemic.   Diarrhea: -she had some diarrhea on Friday - supervisor had her go home, several of her coworkers had the same -imodium helped -no other symptoms, no fevers, sore throat, malaise or vomiting -resolved in 1 days, now symptoms resolved   ROS: See pertinent positives and negatives per HPI.  Past Medical History:  Diagnosis Date  . Anemia   . Back pain   . Constipation   . Dyspnea   . Fatigue   . Joint pain   . Leg edema   . Obesity   . Sleep apnea    lost weight 2013 gastric sleeve, no longer a problem  . Sleep disturbance   . SVD (spontaneous vaginal delivery)    x 3  . Tobacco use     Past Surgical History:  Procedure Laterality Date  . BILATERAL SALPINGECTOMY Bilateral 09/27/2013   Procedure: BILATERAL  SALPINGECTOMY;  Surgeon: Lahoma Crocker, MD;  Location: [facility];  Service: Gynecology;  Laterality: Bilateral;  . CESAREAN SECTION     x 1  . LAPAROTOMY Left 09/27/2013   Procedure: EXPLORATORY LAPAROTOMY W/ EXPLORATION OF LEFT ILLIAC ARTERY & VEIN;  Surgeon: Lahoma Crocker, MD;  Location: [facility];  Service: Gynecology;  Laterality: Left;  . ROBOTIC ASSISTED TOTAL HYSTERECTOMY N/A 09/27/2013   Procedure: ROBOTIC ASSISTED TOTAL HYSTERECTOMY;  Surgeon: Lahoma Crocker, MD;  Location: [facility];  Service: Gynecology;  Laterality: N/A;  . SLEEVE GASTROPLASTY  2013   in [place]    Family History  Problem Relation Age of Onset  . Hypertension Mother   . Colon polyps Mother   . Hyperlipidemia Mother   . Colon cancer Maternal Grandfather 30  . Colon cancer Maternal Aunt        onset in 34s  . Stomach cancer Maternal Aunt   . Leukemia Paternal Grandmother   . Colon polyps Maternal Uncle   . Colon polyps Maternal Aunt     SOCIAL HX:    Current Outpatient Medications:  .  hydrochlorothiazide (HYDRODIURIL) 25 MG tablet, Take 1 tablet (25 mg total) by mouth daily., Disp: 90 tablet, Rfl: 3 .  amLODipine (NORVASC) 2.5 MG tablet, Take 1 tablet (2.5 mg total) by mouth daily., Disp: 30 tablet, Rfl: 3 .  PARoxetine (PAXIL) 10 MG tablet, Take 1 tablet (10 mg total) by mouth daily., Disp: 30 tablet, Rfl: 2  EXAM:  VITALS per patient  if applicable: BP 287/867, P87  GENERAL: alert, oriented, appears well and in no acute distress  HEENT: atraumatic, conjunttiva clear, no obvious abnormalities on inspection of external nose and ears  NECK: normal movements of the head and neck  LUNGS: on inspection no signs of respiratory distress, breathing rate appears normal, no obvious gross SOB, gasping or wheezing  CV: no obvious cyanosis  MS: moves all visible extremities without noticeable abnormality  PSYCH/NEURO: pleasant and cooperative, no obvious depression or anxiety, speech and thought  processing grossly intact  ASSESSMENT AND PLAN:  Discussed the following assessment and plan:  Anxiety  Stress  LAD (lymphadenopathy) of right cervical region  Tobacco use  Essential hypertension  Healthy diet and regular exercise.  Congratulated and encouraged on quitting smoking.  Add amlodipine for the blood pressure. She feels like stress is contributing as well. See below.  LAD resolved. Diarrhea resolved. Call if concerns.  Advised continued CBT and she wanted to start a medication for anxiety. Discussed options and risks and she opted to start a low dose of Paxil. Follow up in 2-3 weeks. Sooner as needed.   I discussed the assessment and treatment plan with the patient. The patient was provided an opportunity to ask questions and all were answered. The patient agreed with the plan and demonstrated an understanding of the instructions.   The patient was advised to call back or seek an in-person evaluation if the symptoms worsen or if the condition fails to improve as anticipated.   Follow up instructions: Advised assistant Wendie Simmer to help patient arrange the following: -follow up with Dr. Maudie Mercury in 2-3 weeks -TOC with one of our providers taking new patients in 3 months   [name], DO

## 2018-08-27 ENCOUNTER — Encounter: Payer: Self-pay | Admitting: Family Medicine

## 2018-08-27 ENCOUNTER — Ambulatory Visit (INDEPENDENT_AMBULATORY_CARE_PROVIDER_SITE_OTHER): Payer: 59 | Admitting: Family Medicine

## 2018-08-27 ENCOUNTER — Other Ambulatory Visit: Payer: Self-pay

## 2018-08-27 VITALS — BP 124/99 | HR 86

## 2018-08-27 DIAGNOSIS — H9201 Otalgia, right ear: Secondary | ICD-10-CM

## 2018-08-27 DIAGNOSIS — F411 Generalized anxiety disorder: Secondary | ICD-10-CM | POA: Diagnosis not present

## 2018-08-27 DIAGNOSIS — I10 Essential (primary) hypertension: Secondary | ICD-10-CM

## 2018-08-27 MED ORDER — FLUTICASONE PROPIONATE 50 MCG/ACT NA SUSP: 2.0000 | Freq: Every day | NASAL | 0 refills | Status: DC

## 2018-08-27 MED ORDER — CETIRIZINE HCL 10 MG PO TABS: 10.0000 mg | ORAL_TABLET | Freq: Every day | ORAL | 11 refills | Status: AC

## 2018-08-27 MED FILL — CETIRIZINE HCL 10 MG TABS: 10 | 30 days supply | Qty: 30 | Fill #0

## 2018-08-27 MED FILL — FLUTICASONE PROP 50 MCG SPR: 50 | 30 days supply | Qty: 16 | Fill #0

## 2018-08-27 NOTE — Progress Notes (Signed)
Virtual Visit via Video Note  I connected with Kayla [name]  on 08/27/18 at  9:30 AM EDT by a video enabled telemedicine application and verified that I am speaking with the correct person using two identifiers.  Location patient: home Location provider:work or home office Persons participating in the virtual visit: patient, provider  I discussed the limitations of evaluation and management by telemedicine and the availability of in person appointments. The patient expressed understanding and agreed to proceed.   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  Past Medical History:  Diagnosis Date  . Anemia   . Back pain   . Constipation   . Dyspnea   . Fatigue   . Joint pain   . Leg edema   . Obesity   . Sleep apnea    lost weight 2013 gastric sleeve, no longer a problem  . Sleep disturbance   . SVD (spontaneous vaginal delivery)    x 3  . Tobacco use     Past Surgical History:  Procedure Laterality Date  . BILATERAL SALPINGECTOMY Bilateral 09/27/2013   Procedure: BILATERAL SALPINGECTOMY;  Surgeon: Lahoma Crocker, MD;  Location: [facility];  Service: Gynecology;  Laterality: Bilateral;  . CESAREAN SECTION     x 1  . LAPAROTOMY Left 09/27/2013   Procedure: EXPLORATORY LAPAROTOMY W/ EXPLORATION OF LEFT ILLIAC ARTERY & VEIN;  Surgeon: Lahoma Crocker, MD;  Location: [facility];  Service: Gynecology;  Laterality: Left;  . ROBOTIC ASSISTED TOTAL HYSTERECTOMY N/A 09/27/2013   Procedure: ROBOTIC ASSISTED TOTAL HYSTERECTOMY;  Surgeon: Lahoma Crocker, MD;  Location: [facility];  Service: Gynecology;  Laterality: N/A;  . SLEEVE GASTROPLASTY  2013   in [place]    Family History  Problem Relation Age of Onset  . Hypertension Mother   . Colon polyps Mother   . Hyperlipidemia Mother   . Colon cancer Maternal Grandfather 45  . Colon cancer Maternal Aunt        onset in 4s  . Stomach cancer Maternal Aunt   . Leukemia Paternal Grandmother   . Colon polyps Maternal Uncle   . Colon polyps Maternal Aunt     SOCIAL HX: see hpi  Current Outpatient Medications:  .  amLODipine (NORVASC) 2.5 MG tablet, Take 1 tablet (2.5 mg total) by mouth daily., Disp: 30 tablet, Rfl: 3 .  hydrochlorothiazide (HYDRODIURIL) 25 MG tablet, Take 1 tablet (25 mg total) by mouth daily., Disp: 90 tablet, Rfl: 3 .  PARoxetine (PAXIL) 10 MG tablet, Take 1 tablet (10 mg total) by mouth daily., Disp: 30 tablet, Rfl: 2 .  cetirizine (ZYRTEC) 10 MG tablet, Take 1 tablet (10 mg total) by mouth daily., Disp: 30 tablet, Rfl: 11 .  fluticasone (FLONASE) 50 MCG/ACT nasal spray, Place 2 sprays into both nostrils daily., Disp: 16 g, Rfl: 0  EXAM:  VITALS per patient if applicable: T 73.5, BP - has not taken her BP med yet, P 80  GENERAL: alert, oriented, appears well and in no acute  distress  HEENT: atraumatic, conjunttiva clear, no obvious abnormalities on inspection of external nose and ears, on inspection of oropharynx no erythema or purulent discharge, no gross rhinorrhea  NECK: normal movements of the head and neck  LUNGS: on inspection no signs of respiratory distress, breathing rate appears normal, no obvious gross SOB, gasping or wheezing  CV: no obvious cyanosis  MS: moves all visible extremities without noticeable abnormality  PSYCH/NEURO: pleasant and cooperative, no obvious depression or anxiety, speech and thought processing grossly intact  ASSESSMENT AND PLAN:  Discussed the following  assessment and plan:  Essential hypertension -advised to take blood pressure medication and then check BP in a few hours and let our office know -lifestyle recs -continue medication or adust pending BP results  GAD (generalized anxiety disorder) -has improved some -advised the medication is supposed to be taken daily rather then prn and she will try this -have also advised cbt  Right ear pain -suspect allergies, but discussed other potential etiologies, in light of COVID19 pandemic and her job I did advise her to also contact health at work -trial nasal saline, antihistamine, flonase -close follow up in a few days to ensure improving -advised she call health at work and follow up immediately if worsening, fevers, sob, etc   I discussed the assessment and treatment plan with the patient. The patient was provided an opportunity to ask questions and all were answered. The patient agreed with the plan and demonstrated an understanding of the instructions.   The patient was advised to call back or seek an in-person evaluation if the symptoms worsen or if the condition fails to improve as anticipated.   Follow up instructions: Advised assistant Wendie Simmer to help patient arrange the following: -call patient around lunch time 12 or 1 today to check on her blood pressure -set up doxy.me visit with Dr. Maudie Mercury this Thursday for follow up ear pain and sore throat    [name], DO

## 2018-08-27 NOTE — Patient Instructions (Signed)
Take your blood pressure medication now and check blood pressure in 2 hours.  Take the paxil every day.  Nasal saline twice daily.  Zyrtec once each night before bed.  Flonase 2 sprays each nostril daily for one month.  Call health at work if any worsening or not improving.  Follow up in 1 month.

## 2018-08-30 ENCOUNTER — Other Ambulatory Visit: Payer: Self-pay

## 2018-08-30 ENCOUNTER — Ambulatory Visit (INDEPENDENT_AMBULATORY_CARE_PROVIDER_SITE_OTHER): Payer: 59 | Admitting: Family Medicine

## 2018-08-30 DIAGNOSIS — H9201 Otalgia, right ear: Secondary | ICD-10-CM | POA: Diagnosis not present

## 2018-08-30 DIAGNOSIS — J309 Allergic rhinitis, unspecified: Secondary | ICD-10-CM

## 2018-08-30 NOTE — Progress Notes (Signed)
Virtual Visit via Video Note  I connected with Kayla [name]  on 08/30/18 at 10:30 AM EDT by a video enabled telemedicine application and verified that I am speaking with the correct person using two identifiers.  Location patient: home Location provider:work or home office Persons participating in the virtual visit: patient, provider  I discussed the limitations of evaluation and management by telemedicine and the availability of in person appointments. The patient expressed understanding and agreed to proceed.   HPI:  Reports she is doing much better and all symptoms resolved after starting the Flonase. Denies any further ear pain, sore throat, drainage or any other symptoms. Denies fever. Reports has    ROS: See pertinent positives and negatives per HPI.  Past Medical History:  Diagnosis Date  . Anemia   . Back pain   . Constipation   . Dyspnea   . Fatigue   . Joint pain   . Leg edema   . Obesity   . Sleep apnea    lost weight 2013 gastric sleeve, no longer a problem  . Sleep disturbance   . SVD (spontaneous vaginal delivery)    x 3  . Tobacco use     Past Surgical History:  Procedure Laterality Date  . BILATERAL SALPINGECTOMY Bilateral 09/27/2013   Procedure: BILATERAL SALPINGECTOMY;  Surgeon: Lahoma Crocker, MD;  Location: [facility];  Service: Gynecology;  Laterality: Bilateral;  . CESAREAN SECTION     x 1  . LAPAROTOMY Left 09/27/2013   Procedure: EXPLORATORY LAPAROTOMY W/ EXPLORATION OF LEFT ILLIAC ARTERY & VEIN;  Surgeon: Lahoma Crocker, MD;  Location: [facility];  Service: Gynecology;  Laterality: Left;  . ROBOTIC ASSISTED TOTAL HYSTERECTOMY N/A 09/27/2013   Procedure: ROBOTIC ASSISTED TOTAL HYSTERECTOMY;  Surgeon: Lahoma Crocker, MD;  Location: [facility];  Service: Gynecology;  Laterality: N/A;  . SLEEVE GASTROPLASTY  2013   in [place]    Family History  Problem Relation Age of Onset  . Hypertension Mother   . Colon polyps Mother   . Hyperlipidemia Mother   . Colon  cancer Maternal Grandfather 38  . Colon cancer Maternal Aunt        onset in 54s  . Stomach cancer Maternal Aunt   . Leukemia Paternal Grandmother   . Colon polyps Maternal Uncle   . Colon polyps Maternal Aunt     SOCIAL HX: see hpi   Current Outpatient Medications:  .  amLODipine (NORVASC) 2.5 MG tablet, Take 1 tablet (2.5 mg total) by mouth daily., Disp: 30 tablet, Rfl: 3 .  cetirizine (ZYRTEC) 10 MG tablet, Take 1 tablet (10 mg total) by mouth daily., Disp: 30 tablet, Rfl: 11 .  fluticasone (FLONASE) 50 MCG/ACT nasal spray, Place 2 sprays into both nostrils daily., Disp: 16 g, Rfl: 0 .  hydrochlorothiazide (HYDRODIURIL) 25 MG tablet, Take 1 tablet (25 mg total) by mouth daily., Disp: 90 tablet, Rfl: 3 .  PARoxetine (PAXIL) 10 MG tablet, Take 1 tablet (10 mg total) by mouth daily., Disp: 30 tablet, Rfl: 2  EXAM:  VITALS per patient if applicable: afebrile per pt - checking/screened daily for symptoms  GENERAL: alert, oriented, appears well and in no acute distress  HEENT: atraumatic, conjunttiva clear, no obvious abnormalities on inspection of external nose and ears, on inspection of the R ear no swelling, drainage or erythema, no pain with tugging the tragus or with gentle palpation over the cervical lymph chains or the TM  NECK: normal movements of the head and neck  LUNGS: on  inspection no signs of respiratory distress, breathing rate appears normal, no obvious gross SOB, gasping or wheezing  CV: no obvious cyanosis  MS: moves all visible extremities without noticeable abnormality  PSYCH/NEURO: pleasant and cooperative, no obvious depression or anxiety, speech and thought processing grossly intact  ASSESSMENT AND PLAN:  Discussed the following assessment and plan:  Right ear pain  Allergic rhinitis, unspecified seasonality, unspecified trigger  Continue allergy regimen thru the spring, INS and zyrtec or claritin She plans to get a new PCP and will call if wishes to  continue at [facility] as I reduce hours/leave clinical practice.   I discussed the assessment and treatment plan with the patient. The patient was provided an opportunity to ask questions and all were answered. The patient agreed with the plan and demonstrated an understanding of the instructions.   The patient was advised to call back or seek an in-person evaluation if the symptoms worsen or if the condition fails to improve as anticipated.   [name], DO

## 2018-10-16 ENCOUNTER — Other Ambulatory Visit: Payer: Self-pay | Admitting: Family Medicine

## 2018-10-16 MED FILL — CETIRIZINE HCL 10 MG TABS: 10 | 30 days supply | Qty: 30 | Fill #1

## 2018-10-16 MED FILL — HYDROCHLOROTHIAZIDE 25 MG T: 25 | 90 days supply | Qty: 90 | Fill #0

## 2018-10-16 MED FILL — AMLODIPINE 2.5 MG TABLET: 2.5 | 30 days supply | Qty: 30 | Fill #1

## 2018-10-16 MED FILL — PARoxetine HCL 10 MG TABS: 10 | 30 days supply | Qty: 30 | Fill #1

## 2018-10-18 MED FILL — FLUTICASONE PROP 50 MCG SPR: 50 | 30 days supply | Qty: 16 | Fill #0

## 2018-11-15 IMAGING — DX DG CHEST 2V
2 series · 2 of 2 positions shown · non-contrast
Comparison: 10/09/2015

CLINICAL DATA: Cough and congestion for 1 month

EXAM:
CHEST  2 VIEW

[chest pa]
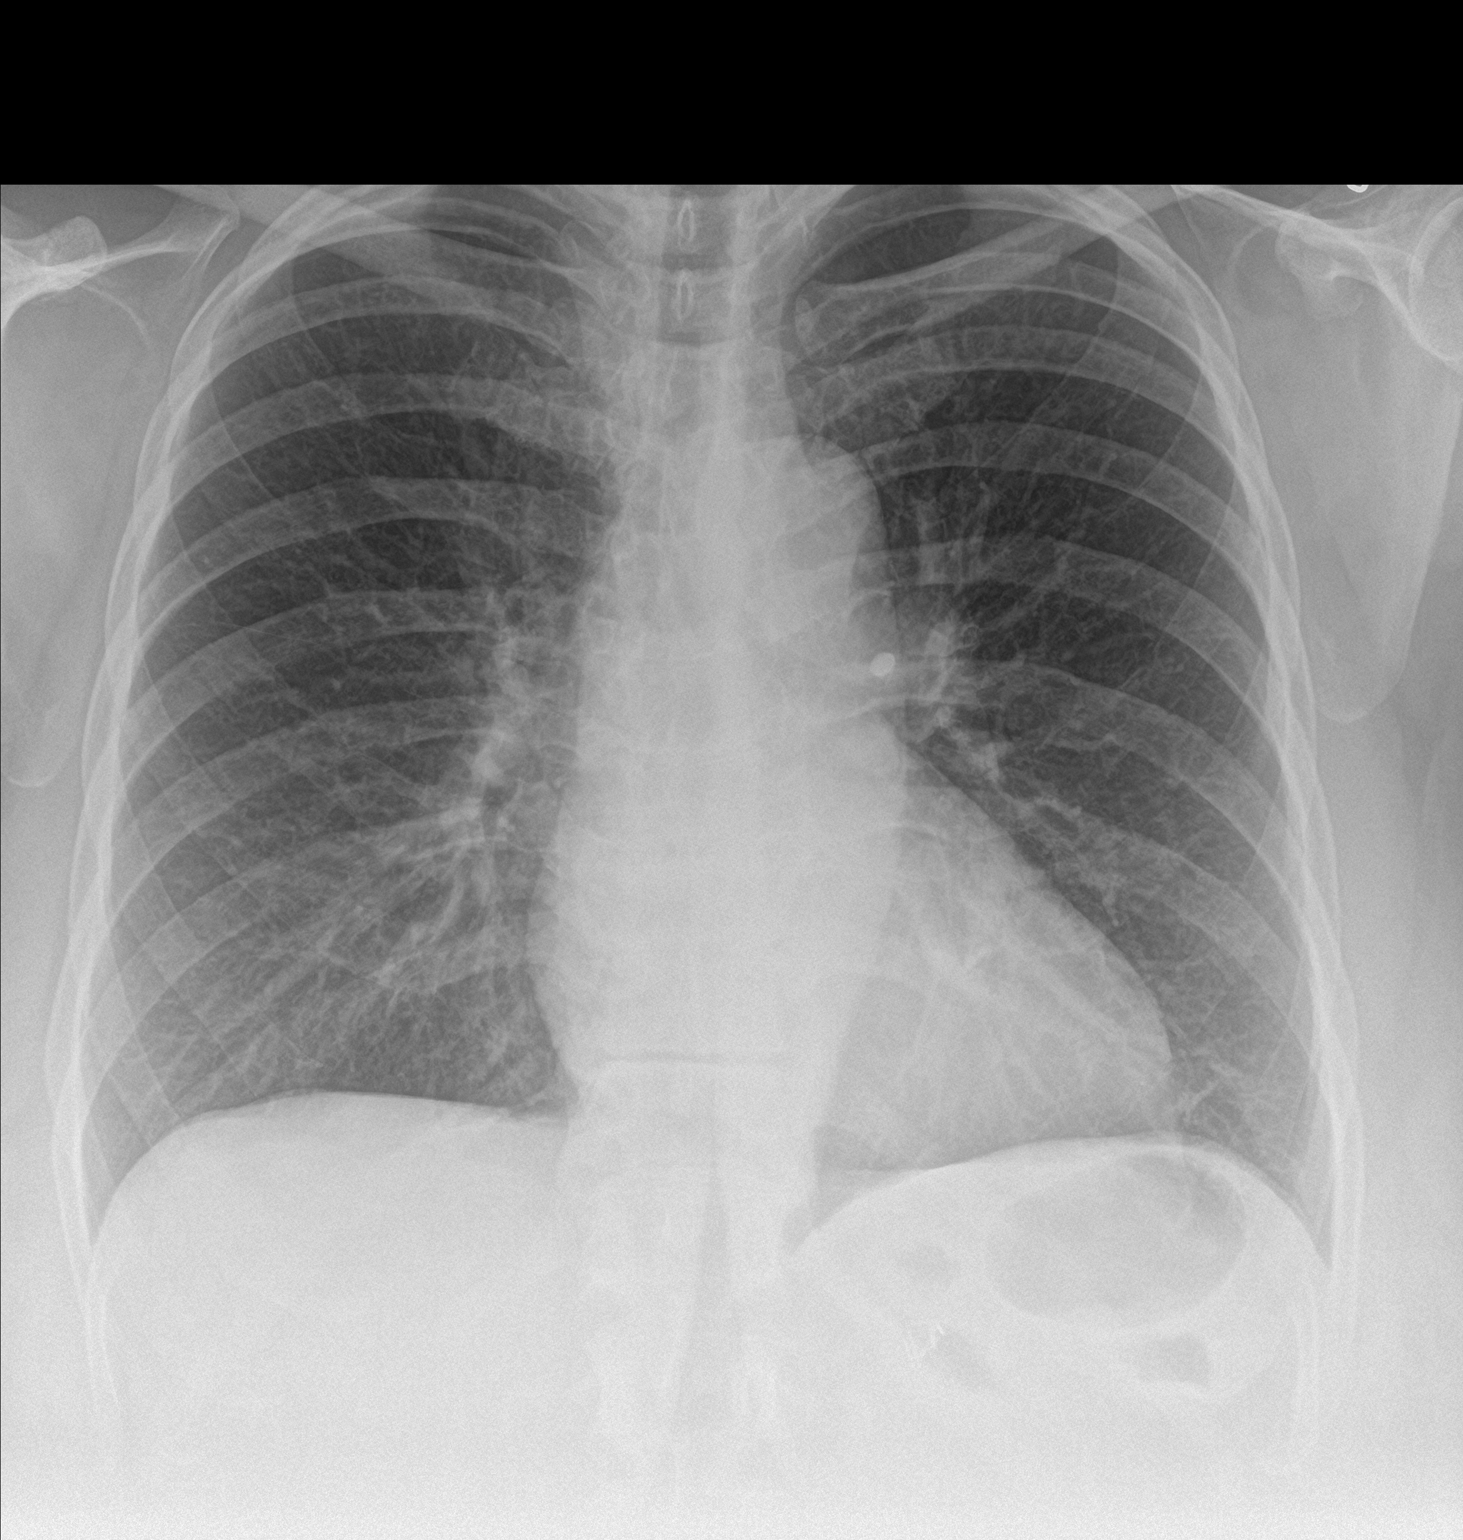

[chest lat]
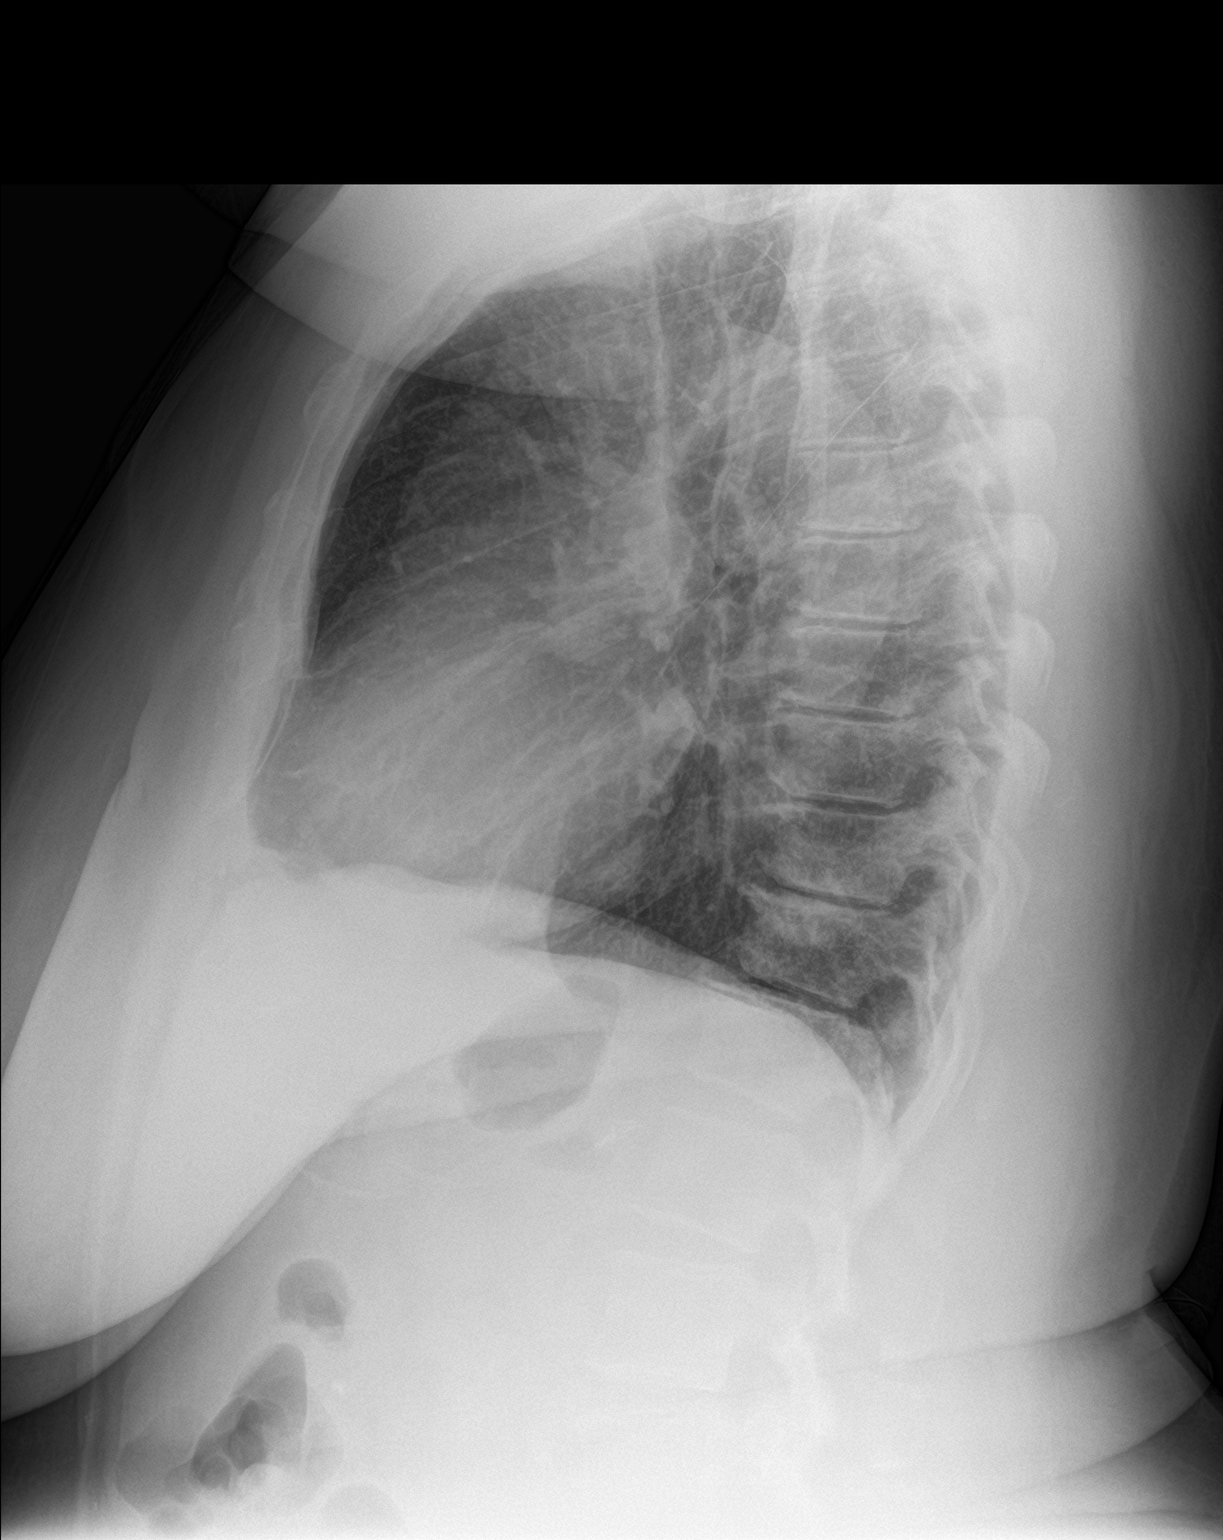

[2 of 2 positions shown; findings below may reference images not displayed]

FINDINGS: The heart size and mediastinal contours are within normal limits.
Both lungs are clear. The visualized skeletal structures show
degenerative change of thoracic spine.
IMPRESSION: No active cardiopulmonary disease.

## 2018-12-11 ENCOUNTER — Other Ambulatory Visit: Payer: Self-pay

## 2018-12-11 ENCOUNTER — Telehealth (INDEPENDENT_AMBULATORY_CARE_PROVIDER_SITE_OTHER): Payer: 59 | Admitting: Family Medicine

## 2018-12-11 DIAGNOSIS — R300 Dysuria: Secondary | ICD-10-CM

## 2018-12-11 MED ORDER — NITROFURANTOIN MONOHYD MACRO 100 MG PO CAPS: 100.0000 mg | ORAL_CAPSULE | Freq: Two times a day (BID) | ORAL | 0 refills | Status: DC

## 2018-12-11 NOTE — Progress Notes (Signed)
Virtual Visit via Video Note  I connected with Kayla [name]  on 12/11/18 at  6:00 PM EDT by a video enabled telemedicine application and verified that I am speaking with the correct person using two identifiers.  Location patient: home Location provider:work or home office Persons participating in the virtual visit: patient, provider  I discussed the limitations of evaluation and management by telemedicine and the availability of in person appointments. The patient expressed understanding and agreed to proceed.   HPI:  Acute visit for "bladder infection": -started about 5-6 days ago -symptoms include frequency, urgency, pressure in bladder -denies fevers, flank pain, NVD, pelvic pain, vaginal symptoms, hematuria -reports feels similar to UTIs she has had in the past -she tried cranberry juice but it has not helped  ROS: See pertinent positives and negatives per HPI.  Past Medical History:  Diagnosis Date  . Anemia   . Back pain   . Constipation   . Dyspnea   . Fatigue   . Joint pain   . Leg edema   . Obesity   . Sleep apnea    lost weight 2013 gastric sleeve, no longer a problem  . Sleep disturbance   . SVD (spontaneous vaginal delivery)    x 3  . Tobacco use     Past Surgical History:  Procedure Laterality Date  . BILATERAL SALPINGECTOMY Bilateral 09/27/2013   Procedure: BILATERAL SALPINGECTOMY;  Surgeon: Lahoma Crocker, MD;  Location: [facility];  Service: Gynecology;  Laterality: Bilateral;  . CESAREAN SECTION     x 1  . LAPAROTOMY Left 09/27/2013   Procedure: EXPLORATORY LAPAROTOMY W/ EXPLORATION OF LEFT ILLIAC ARTERY & VEIN;  Surgeon: Lahoma Crocker, MD;  Location: [facility];  Service: Gynecology;  Laterality: Left;  . ROBOTIC ASSISTED TOTAL HYSTERECTOMY N/A 09/27/2013   Procedure: ROBOTIC ASSISTED TOTAL HYSTERECTOMY;  Surgeon: Lahoma Crocker, MD;  Location: [facility];  Service: Gynecology;  Laterality: N/A;  . SLEEVE GASTROPLASTY  2013   in [place]    Family History   Problem Relation Age of Onset  . Hypertension Mother   . Colon polyps Mother   . Hyperlipidemia Mother   . Colon cancer Maternal Grandfather 71  . Colon cancer Maternal Aunt        onset in 74s  . Stomach cancer Maternal Aunt   . Leukemia Paternal Grandmother   . Colon polyps Maternal Uncle   . Colon polyps Maternal Aunt     SOCIAL HX: see hpi   Current Outpatient Medications:  .  amLODipine (NORVASC) 2.5 MG tablet, Take 1 tablet (2.5 mg total) by mouth daily., Disp: 30 tablet, Rfl: 3 .  cetirizine (ZYRTEC) 10 MG tablet, Take 1 tablet (10 mg total) by mouth daily., Disp: 30 tablet, Rfl: 11 .  fluticasone (FLONASE) 50 MCG/ACT nasal spray, PLACE 2 SPRAYS INTO BOTH NOSTRILS DAILY., Disp: 16 g, Rfl: 3 .  hydrochlorothiazide (HYDRODIURIL) 25 MG tablet, Take 1 tablet (25 mg total) by mouth daily., Disp: 90 tablet, Rfl: 3 .  nitrofurantoin, macrocrystal-monohydrate, (MACROBID) 100 MG capsule, Take 1 capsule (100 mg total) by mouth 2 (two) times daily., Disp: 14 capsule, Rfl: 0 .  PARoxetine (PAXIL) 10 MG tablet, Take 1 tablet (10 mg total) by mouth daily., Disp: 30 tablet, Rfl: 2  EXAM:  VITALS per patient if applicable:denies fever  GENERAL: alert, oriented, appears well and in no acute distress  HEENT: atraumatic, conjunttiva clear, no obvious abnormalities on inspection of external nose and ears  NECK: normal movements of the head  and neck  LUNGS: on inspection no signs of respiratory distress, breathing rate appears normal, no obvious gross SOB, gasping or wheezing  CV: no obvious cyanosis  MS: moves all visible extremities without noticeable abnormality  PSYCH/NEURO: pleasant and cooperative, no obvious depression or anxiety, speech and thought processing grossly intact  ASSESSMENT AND PLAN:  Discussed the following assessment and plan:  Dysuria   -we discussed possible serious and likely etiologies, treatment and return precautions -after this discussion, Alphia opted  for emp eric treatment with macrobid. Conofirmed pharmacy choice.  She agrees to follow up if worsening, new symptoms or symptoms do not resolve with treatment   I discussed the assessment and treatment plan with the patient. The patient was provided an opportunity to ask questions and all were answered. The patient agreed with the plan and demonstrated an understanding of the instructions.   The patient was advised to call back or seek an in-person evaluation if the symptoms worsen or if the condition fails to improve as anticipated.   [name], DO

## 2018-12-12 MED FILL — NITROFURANTOIN MONO-MCR 100: 100 | 7 days supply | Qty: 14 | Fill #0

## 2018-12-18 ENCOUNTER — Encounter: Payer: Self-pay | Admitting: Family Medicine

## 2018-12-18 ENCOUNTER — Other Ambulatory Visit: Payer: Self-pay

## 2018-12-18 DIAGNOSIS — Z20822 Contact with and (suspected) exposure to covid-19: Secondary | ICD-10-CM

## 2018-12-19 LAB — NOVEL CORONAVIRUS, NAA: SARS-CoV-2, NAA: NOT DETECTED

## 2018-12-27 MED FILL — PARoxetine HCL 10 MG TABS: 10 | 30 days supply | Qty: 30 | Fill #2

## 2018-12-27 MED FILL — AMLODIPINE 2.5 MG TABLET: 2.5 | 30 days supply | Qty: 30 | Fill #2

## 2019-03-15 ENCOUNTER — Telehealth (INDEPENDENT_AMBULATORY_CARE_PROVIDER_SITE_OTHER): Payer: 59 | Admitting: Family Medicine

## 2019-03-15 DIAGNOSIS — F17210 Nicotine dependence, cigarettes, uncomplicated: Secondary | ICD-10-CM

## 2019-03-15 DIAGNOSIS — U071 COVID-19: Secondary | ICD-10-CM

## 2019-03-15 NOTE — Progress Notes (Signed)
Virtual Visit via Video Note  I connected with Kayla [name] on 03/15/19 at  1:00 PM EST by a video enabled telemedicine application 2/2 [id number] pandemic and verified that I am speaking with the correct person using two identifiers.  Location patient: home Location provider:work or home office Persons participating in the virtual visit: patient, provider  I discussed the limitations of evaluation and management by telemedicine and the availability of in person appointments. The patient expressed understanding and agreed to proceed.   HPI: Pt tested positive for COVID-19 on 03/07/19 through [facility].  Pt states the results were not put in her chart as "[facility] states they don't put that in the records".  At the time pt only had diarrhea and congestion.  Took imodium.  Still having fatigue and no sense of taste or smell.  Pt endorses difficulty walking around her house 2/2 the fatigue.  Has been in bed.  Pt's husband and 2 sons also tested positive.  Pt scheduled to go back to work on Monday. Pt works in [employer].  Pt quit smoking x 3 months, then restarted.  She states she gained 25 lbs when she quit.   ROS: See pertinent positives and negatives per HPI.  Past Medical History:  Diagnosis Date  . Anemia   . Back pain   . Constipation   . Dyspnea   . Fatigue   . Joint pain   . Leg edema   . Obesity   . Sleep apnea    lost weight 2013 gastric sleeve, no longer a problem  . Sleep disturbance   . SVD (spontaneous vaginal delivery)    x 3  . Tobacco use     Past Surgical History:  Procedure Laterality Date  . BILATERAL SALPINGECTOMY Bilateral 09/27/2013   Procedure: BILATERAL SALPINGECTOMY;  Surgeon: Lahoma Crocker, MD;  Location: [facility];  Service: Gynecology;  Laterality: Bilateral;  . CESAREAN SECTION     x 1  . LAPAROTOMY Left 09/27/2013   Procedure: EXPLORATORY LAPAROTOMY W/ EXPLORATION OF LEFT ILLIAC ARTERY & VEIN;  Surgeon: Lahoma Crocker, MD;  Location: [facility];  Service: Gynecology;  Laterality: Left;  . ROBOTIC ASSISTED TOTAL HYSTERECTOMY N/A 09/27/2013   Procedure: ROBOTIC ASSISTED TOTAL HYSTERECTOMY;  Surgeon: Lahoma Crocker, MD;  Location: [facility];  Service: Gynecology;  Laterality: N/A;  . SLEEVE GASTROPLASTY  2013   in [place]    Family History  Problem Relation Age of Onset  . Hypertension Mother   . Colon polyps Mother   . Hyperlipidemia Mother   . Colon cancer Maternal Grandfather 37  . Colon cancer Maternal Aunt        onset in 6s  . Stomach cancer Maternal Aunt   . Leukemia Paternal Grandmother   . Colon polyps Maternal Uncle   . Colon polyps Maternal Aunt      Current Outpatient Medications:  .  amLODipine (NORVASC) 2.5 MG tablet, Take 1 tablet (2.5 mg total) by mouth daily., Disp: 30 tablet, Rfl: 3 .  cetirizine (ZYRTEC) 10 MG tablet, Take 1 tablet (10 mg total) by mouth daily., Disp: 30 tablet, Rfl: 11 .  fluticasone (FLONASE) 50 MCG/ACT nasal spray, PLACE 2 SPRAYS INTO BOTH NOSTRILS DAILY., Disp: 16 g, Rfl: 3 .  hydrochlorothiazide (HYDRODIURIL) 25 MG tablet, Take 1 tablet (25 mg total) by mouth daily., Disp: 90 tablet, Rfl: 3 .  nitrofurantoin, macrocrystal-monohydrate, (MACROBID) 100 MG capsule, Take 1 capsule (100 mg total) by mouth 2 (two)  times daily., Disp: 14 capsule, Rfl: 0 .  PARoxetine (PAXIL) 10 MG tablet, Take 1 tablet (10 mg total) by mouth daily., Disp: 30 tablet, Rfl: 2  EXAM:  VITALS per patient if applicable: RR between 123456 bpm   GENERAL: alert, oriented, appears well and in no acute distress  HEENT: atraumatic, conjunctiva clear, no obvious abnormalities on inspection of external nose and ears  NECK: normal movements of the head and neck  LUNGS: on inspection no signs of respiratory distress, breathing rate appears normal, no obvious gross SOB, gasping or wheezing  CV: no obvious cyanosis  MS: moves all visible extremities without noticeable  abnormality  PSYCH/NEURO: pleasant and cooperative, no obvious depression or anxiety, speech and thought processing grossly intact  ASSESSMENT AND PLAN:  Discussed the following assessment and plan:  COVID-19 virus infection -Per pt positive test on 03/07/19 via [facility].  Results not available in chart. -Will provide note for work.  Pt advised to quarantine for at least 14 days.   -plan to reassess on Thursday 03/21/19. -given precautions  Cigarette nicotine dependence without complication  -smoking cessation counseling >3 min, <10 min -pt encouraged to decrease the amount of cigarettes smoked daily -consider quit aids -will reassess.  F/u next wk.   I discussed the assessment and treatment plan with the patient. The patient was provided an opportunity to ask questions and all were answered. The patient agreed with the plan and demonstrated an understanding of the instructions.   The patient was advised to call back or seek an in-person evaluation if the symptoms worsen or if the condition fails to improve as anticipated.   Billie Ruddy, MD

## 2019-03-27 DIAGNOSIS — U071 COVID-19: Secondary | ICD-10-CM | POA: Diagnosis not present

## 2019-04-09 DIAGNOSIS — Z20828 Contact with and (suspected) exposure to other viral communicable diseases: Secondary | ICD-10-CM | POA: Diagnosis not present

## 2019-04-26 MED FILL — AMLODIPINE 2.5 MG TABLET: 2.5 | 30 days supply | Qty: 30 | Fill #3

## 2019-04-26 MED FILL — HYDROCHLOROTHIAZIDE 25 MG T: 25 | 90 days supply | Qty: 90 | Fill #1

## 2019-05-06 ENCOUNTER — Telehealth: Payer: Self-pay

## 2019-05-06 NOTE — Telephone Encounter (Signed)
Copied from [place] [phone]. Topic: Appointment Scheduling - Scheduling Inquiry for Clinic >> May 06, 2019 12:39 PM Percell Belt A wrote: Reason for CRM: pt called in and stated she thinks she has a uti and would like to make an appt asap?  Office on other calls.  Please advise  Best number [phone]

## 2019-05-07 ENCOUNTER — Ambulatory Visit (INDEPENDENT_AMBULATORY_CARE_PROVIDER_SITE_OTHER): Payer: 59 | Admitting: Family Medicine

## 2019-05-07 ENCOUNTER — Other Ambulatory Visit: Payer: Self-pay

## 2019-05-07 ENCOUNTER — Encounter: Payer: Self-pay | Admitting: Family Medicine

## 2019-05-07 VITALS — BP 130/80 | HR 98 | Resp 16 | Ht 65.0 in | Wt 244.0 lb

## 2019-05-07 DIAGNOSIS — R809 Proteinuria, unspecified: Secondary | ICD-10-CM | POA: Diagnosis not present

## 2019-05-07 DIAGNOSIS — R350 Frequency of micturition: Secondary | ICD-10-CM | POA: Diagnosis not present

## 2019-05-07 DIAGNOSIS — I10 Essential (primary) hypertension: Secondary | ICD-10-CM

## 2019-05-07 LAB — POCT URINALYSIS DIPSTICK
Blood, UA: NEGATIVE
Glucose, UA: NEGATIVE
Ketones, UA: NEGATIVE
Leukocytes, UA: NEGATIVE
Nitrite, UA: NEGATIVE
Protein, UA: POSITIVE — AB
Spec Grav, UA: 1.025 (ref 1.010–1.025)
Urobilinogen, UA: 0.2 E.U./dL
pH, UA: 6 (ref 5.0–8.0)

## 2019-05-07 LAB — BASIC METABOLIC PANEL
BUN: 17 mg/dL (ref 6–23)
CO2: 25 mEq/L (ref 19–32)
Calcium: 8.8 mg/dL (ref 8.4–10.5)
Chloride: 105 mEq/L (ref 96–112)
Creatinine, Ser: 0.72 mg/dL (ref 0.40–1.20)
GFR: 103.37 mL/min (ref >60.00)
Glucose, Bld: 94 mg/dL (ref 70–99)
Potassium: 4.1 mEq/L (ref 3.5–5.1)
Sodium: 136 mEq/L (ref 135–145)

## 2019-05-07 LAB — HEMOGLOBIN A1C: Hgb A1c MFr Bld: 6.5 % (ref 4.6–6.5)

## 2019-05-07 MED ORDER — MIRABEGRON ER 25 MG PO TB24: 25.0000 mg | ORAL_TABLET | Freq: Every day | ORAL | 1 refills | Status: DC

## 2019-05-07 MED FILL — MYRBETRIQ ER 25 MG TABLET: 25 | 30 days supply | Qty: 30 | Fill #0

## 2019-05-07 NOTE — Patient Instructions (Signed)
A few things to remember from today's visit:   Urinary frequency - Plan: POC Urinalysis Dipstick, Urine culture  Proteinuria, unspecified type - Plan: Basic Metabolic Panel  Essential hypertension - Plan: Basic Metabolic Panel  Urine today and symptoms do not suggest a urine tract infection. Tighten and relax the pelvic muscles intermittently during the day. Once you are familiar with exercise try to hold pelvic muscles contraction for about 8-10 seconds.in the beginning you may not be able to hold contraction for more than a second or 2 but eventually you will be able to hold contraction harder and for longer time. Perform  8-12 exercises 3 times per day and daily for 15-20 weeks. You will need to continue exercises indefinitely to have a lasting effect.    Overactive Bladder, Adult  Overactive bladder refers to a condition in which a person has a sudden need to pass urine. The person may leak urine if he or she cannot get to the bathroom fast enough (urinary incontinence). A person with this condition may also wake up several times in the night to go to the bathroom. Overactive bladder is associated with poor nerve signals between your bladder and your brain. Your bladder may get the signal to empty before it is full. You may also have very sensitive muscles that make your bladder squeeze too soon. These symptoms might interfere with daily work or social activities. What are the causes? This condition may be associated with or caused by:  Urinary tract infection.  Infection of nearby tissues, such as the prostate.  Surgery on the uterus or urethra.  Bladder stones, inflammation, or tumors.  Drinking too much caffeine or alcohol.  Certain medicines, especially medicines that get rid of extra fluid in the body (diuretics).  Muscle or nerve weakness, especially from: ? A spinal cord injury. ? Stroke. ? Multiple sclerosis. ? Parkinson's  disease.  Diabetes.  Constipation. What increases the risk? You may be at greater risk for overactive bladder if you:  Are an older adult.  Smoke.  Are going through menopause.  Have prostate problems.  Have a neurological disease, such as stroke, dementia, Parkinson's disease, or multiple sclerosis (MS).  Eat or drink things that irritate the bladder. These include alcohol, spicy food, and caffeine.  Are overweight or obese. What are the signs or symptoms? Symptoms of this condition include:  Sudden, strong urge to urinate.  Leaking urine.  Urinating 8 or more times a day.  Waking up to urinate 2 or more times a night. How is this diagnosed? Your health care provider may suspect overactive bladder based on your symptoms. He or she will diagnose this condition by:  A physical exam and medical history.  Blood or urine tests. You might need bladder or urine tests to help determine what is causing your overactive bladder. You might also need to see a health care provider who specializes in urinary tract problems (urologist). How is this treated? Treatment for overactive bladder depends on the cause of your condition and whether it is mild or severe. You can also make lifestyle changes at home. Options include:  Bladder training. This may include: ? Learning to control the urge to urinate by following a schedule that directs you to urinate at regular intervals (timed voiding). ? Doing Kegel exercises to strengthen your pelvic floor muscles, which support your bladder. Toning these muscles can help you control urination, even if your bladder muscles are overactive.  Special devices. This may include: ? Biofeedback, which  uses sensors to help you become aware of your body's signals. ? Electrical stimulation, which uses electrodes placed inside the body (implanted) or outside the body. These electrodes send gentle pulses of electricity to strengthen the nerves or muscles that  control the bladder. ? Women may use a plastic device that fits into the vagina and supports the bladder (pessary).  Medicines. ? Antibiotics to treat bladder infection. ? Antispasmodics to stop the bladder from releasing urine at the wrong time. ? Tricyclic antidepressants to relax bladder muscles. ? Injections of botulinum toxin type A directly into the bladder tissue to relax bladder muscles.  Lifestyle changes. This may include: ? Weight loss. Talk to your health care provider about weight loss methods that would work best for you. ? Diet changes. This may include reducing how much alcohol and caffeine you consume, or drinking fluids at different times of the day. ? Not smoking. Do not use any products that contain nicotine or tobacco, such as cigarettes and e-cigarettes. If you need help quitting, ask your health care provider.  Surgery. ? A device may be implanted to help manage the nerve signals that control urination. ? An electrode may be implanted to stimulate electrical signals in the bladder. ? A procedure may be done to change the shape of the bladder. This is done only in very severe cases. Follow these instructions at home: Lifestyle  Make any diet or lifestyle changes that are recommended by your health care provider. These may include: ? Drinking less fluid or drinking fluids at different times of the day. ? Cutting down on caffeine or alcohol. ? Doing Kegel exercises. ? Losing weight if needed. ? Eating a healthy and balanced diet to prevent constipation. This may include:  Eating foods that are high in fiber, such as fresh fruits and vegetables, whole grains, and beans.  Limiting foods that are high in fat and processed sugars, such as fried and sweet foods. General instructions  Take over-the-counter and prescription medicines only as told by your health care provider.  If you were prescribed an antibiotic medicine, take it as told by your health care provider.  Do not stop taking the antibiotic even if you start to feel better.  Use any implants or pessary as told by your health care provider.  If needed, wear pads to absorb urine leakage.  Keep a journal or log to track how much and when you drink and when you feel the need to urinate. This will help your health care provider monitor your condition.  Keep all follow-up visits as told by your health care provider. This is important. Contact a health care provider if:  You have a fever.  Your symptoms do not get better with treatment.  Your pain and discomfort get worse.  You have more frequent urges to urinate. Get help right away if:  You are not able to control your bladder. Summary  Overactive bladder refers to a condition in which a person has a sudden need to pass urine.  Several conditions may lead to an overactive bladder.  Treatment for overactive bladder depends on the cause and severity of your condition.  Follow your health care provider's instructions about lifestyle changes, doing Kegel exercises, keeping a journal, and taking medicines. This information is not intended to replace advice given to you by your health care provider. Make sure you discuss any questions you have with your health care provider. Document Released: 02/19/2009 Document Revised: 08/16/2018 Document Reviewed: 05/11/2017 Elsevier Patient Education  [place].     Please be sure medication list is accurate. If a new problem present, please set up appointment sooner than planned today.

## 2019-05-07 NOTE — Progress Notes (Signed)
HPI:  Chief Complaint  Patient presents with  . Urinary Tract Infection    x 2 weeks, has seen urologist in the past due to frequent utis    [name] is a 48 y.o. female with Hx of HTN who is here today complaining of of urinary symptoms as described above.  Dysuria: Negative. But she c/o pressure like sensation while voiding. Urinary frequency: Positive. Nocturia x 1. Urinary urgency: Positive. Incontinence: Negative. Gross hematuria: Negative.  She Denies fever, chills, change in appetite, or unusual fatigue.  Abdominal pain: Negative. Nausea or vomiting: Negative.  Abnormal vaginal bleeding or discharge: She ahs noted mild whitish vaginal discharge. No Hx of STD's.  [id number] menopausal. Sexual activity: Yes Hx of UTI: Follows with urologist. States that she was started on daily oral abx but still having UTI's. According to pt,she underwent bladder biopsy.  Ucx in 04/2018 grew a Streptococcus viridans, 50,000-10,000 CFU/mL.  OTC medications for this problem: No  Urine dipstick positive for protein.  HTN, she is taking medication as instructed. She is on Amlodipine 2.5 mg daily and HCTZ 25 mg daily. No Hx of renal disease.   Lab Results  Component Value Date   CREATININE 0.71 02/07/2018   BUN 13 02/07/2018   NA 139 02/07/2018   K 3.6 02/07/2018   CL 103 02/07/2018   CO2 25 02/07/2018    Review of Systems  Constitutional: Negative for activity change, appetite change, fatigue and fever.  Cardiovascular: Negative for leg swelling.  Gastrointestinal:       Changes in bowel habits.  Genitourinary: Negative for decreased urine volume and difficulty urinating.  Musculoskeletal: Negative for back pain and myalgias.  Skin: Negative for pallor and rash.  Rest see pertinent positives and negatives per HPI.   Current Outpatient Medications on File Prior to Visit  Medication Sig Dispense Refill  . amLODipine (NORVASC) 2.5 MG tablet Take 1  tablet (2.5 mg total) by mouth daily. 30 tablet 3  . cetirizine (ZYRTEC) 10 MG tablet Take 1 tablet (10 mg total) by mouth daily. 30 tablet 11  . fluticasone (FLONASE) 50 MCG/ACT nasal spray PLACE 2 SPRAYS INTO BOTH NOSTRILS DAILY. 16 g 3  . hydrochlorothiazide (HYDRODIURIL) 25 MG tablet Take 1 tablet (25 mg total) by mouth daily. 90 tablet 3  . PARoxetine (PAXIL) 10 MG tablet Take 1 tablet (10 mg total) by mouth daily. 30 tablet 2   No current facility-administered medications on file prior to visit.     Past Medical History:  Diagnosis Date  . Anemia   . Back pain   . Constipation   . Dyspnea   . Fatigue   . Joint pain   . Leg edema   . Obesity   . Sleep apnea    lost weight 2013 gastric sleeve, no longer a problem  . Sleep disturbance   . SVD (spontaneous vaginal delivery)    x 3  . Tobacco use    No Known Allergies  Social History   Socioeconomic History  . Marital status: Married    Spouse name: Kinnie Scales  . Number of children: 4  . Years of education: Not on file  . Highest education level: Not on file  Occupational History  . Not on file  Tobacco Use  . Smoking status: Current Every Day Smoker    Packs/day: 1.00    Years: 25.00    Pack years: 25.00    Types: Cigarettes  . Smokeless  tobacco: Current User  Substance and Sexual Activity  . Alcohol use: Yes    Alcohol/week: 0.0 standard drinks    Comment: socially  . Drug use: No  . Sexual activity: Yes    Partners: Male    Birth control/protection: None  Other Topics Concern  . Not on file  Social History Narrative   Work or School: Producer, television/film/video, Catering manager      Home Situation: lives with husband and 21 yo sone in 2016      Spiritual Beliefs: Christian      Lifestyle: no regular exercise, poor diet      Social Determinants of Health   Financial Resource Strain:   . Difficulty of Paying Living Expenses: Not on file  Food Insecurity:   . Worried About Charity fundraiser in the Last  Year: Not on file  . Ran Out of Food in the Last Year: Not on file  Transportation Needs:   . Lack of Transportation (Medical): Not on file  . Lack of Transportation (Non-Medical): Not on file  Physical Activity:   . Days of Exercise per Week: Not on file  . Minutes of Exercise per Session: Not on file  Stress:   . Feeling of Stress : Not on file  Social Connections:   . Frequency of Communication with Friends and Family: Not on file  . Frequency of Social Gatherings with Friends and Family: Not on file  . Attends Religious Services: Not on file  . Active Member of Clubs or Organizations: Not on file  . Attends Archivist Meetings: Not on file  . Marital Status: Not on file    Vitals:   05/07/19 0854  BP: 130/80  Pulse: 98  Resp: 16  SpO2: 98%   Body mass index is 40.6 kg/m.   Physical Exam  Nursing note and vitals reviewed. Constitutional: She is oriented to person, place, and time. She appears well-developed. No distress.  HENT:  Head: Normocephalic and atraumatic.  Eyes: Conjunctivae are normal.  Cardiovascular: Normal rate and regular rhythm.  Respiratory: Effort normal and breath sounds normal. No respiratory distress.  GI: Soft. She exhibits no mass. There is no abdominal tenderness. There is no CVA tenderness.  Musculoskeletal:        General: No edema.  Neurological: She is alert and oriented to person, place, and time. She has normal strength. No cranial nerve deficit. Gait normal.  Skin: Skin is warm. No rash noted. No erythema.  Psychiatric: She has a normal mood and affect.  Well groomed,good eye contact.    ASSESSMENT AND PLAN:  Ms. Kayla [name] was seen today for urinary tract infection.  Diagnoses and all orders for this visit: Orders Placed This Encounter  Procedures  . Urine culture  . Basic Metabolic Panel  . Hemoglobin A1c  . POC Urinalysis Dipstick   Lab Results  Component Value Date   HGBA1C 6.5 05/07/2019   Lab Results    Component Value Date   CREATININE 0.72 05/07/2019   BUN 17 05/07/2019   NA 136 05/07/2019   K 4.1 05/07/2019   CL 105 05/07/2019   CO2 25 05/07/2019   Urinary frequency Hx does not suggest UTI. Other possible etiologies discussed. HCTZ could also aggravate problem. We will follow UCx. Further recommendations will be given according to lab results.   Urine dipstick today negative otherwise, except for protein.Myrbeqtriq 25 mg daily and Kegel/pelvic floor exercises.  Proteinuria, unspecified type Adequate BP controlled.  -  Basic Metabolic Panel  Essential hypertension BP adequately controlled, No changes in current management.  -     Basic Metabolic Panel  Other orders -     mirabegron ER (MYRBETRIQ) 25 MG TB24 tablet; Take 1 tablet (25 mg total) by mouth daily.   Return in 4 weeks (on 06/04/2019) for urinary symptoms with PCP.    Kayla [name] G. [place], MD  [facility]. [place] office.

## 2019-05-08 ENCOUNTER — Encounter: Payer: Self-pay | Admitting: Family Medicine

## 2019-05-09 LAB — URINE CULTURE
MICRO NUMBER:: 1236943
Result:: NO GROWTH
SPECIMEN QUALITY:: ADEQUATE

## 2019-05-12 ENCOUNTER — Encounter: Payer: Self-pay | Admitting: Family Medicine

## 2019-06-03 ENCOUNTER — Telehealth: Payer: Self-pay | Admitting: Family Medicine

## 2019-06-03 DIAGNOSIS — R350 Frequency of micturition: Secondary | ICD-10-CM

## 2019-06-03 NOTE — Telephone Encounter (Signed)
Pt saw Dr. Martinique back in December for a UTI. Pt states she referred her to a Urology (believes to be the one at [facility]) specialist due to it not being a UTI. Pt has not been contacted and when she called the Urology place they said there is no referral. Pt would like a referral for Urology and to contact her at [phone] and if you can't reach her there send a message through [place].

## 2019-06-03 NOTE — Telephone Encounter (Signed)
Referral placed, message sent to pt through mychart.

## 2019-06-04 NOTE — Telephone Encounter (Signed)
Spoke with pt, she is aware referral has been placed to [facility].

## 2019-07-05 DIAGNOSIS — R350 Frequency of micturition: Secondary | ICD-10-CM | POA: Diagnosis not present

## 2019-07-05 DIAGNOSIS — R8271 Bacteriuria: Secondary | ICD-10-CM | POA: Diagnosis not present

## 2019-07-05 DIAGNOSIS — R351 Nocturia: Secondary | ICD-10-CM | POA: Diagnosis not present

## 2019-07-05 MED FILL — NITROFURANTOIN MONO-MCR 100: 100 | 30 days supply | Qty: 30 | Fill #0

## 2019-07-05 MED FILL — CIPROFLOXACIN HCL 250 MG TA: 250 | 7 days supply | Qty: 14 | Fill #0

## 2019-07-09 ENCOUNTER — Telehealth: Payer: Self-pay | Admitting: Family Medicine

## 2019-07-09 DIAGNOSIS — R350 Frequency of micturition: Secondary | ICD-10-CM

## 2019-07-09 NOTE — Telephone Encounter (Signed)
I tried contacting pt to see if there is somewhere specific she wants to go - unable to leave a voicemail.

## 2019-07-09 NOTE — Telephone Encounter (Signed)
Pt would like a second opinion. If possible she would like a different referral to an Urologist that is not with [facility].  Pt can be reached at [phone]

## 2019-07-10 NOTE — Telephone Encounter (Signed)
Pt called back and said that any urology office that accepts her insurance is ok.

## 2019-07-10 NOTE — Addendum Note (Signed)
Addended by: Rodrigo Ran on: 07/10/2019 02:46 PM   Modules accepted: Orders

## 2019-07-10 NOTE — Telephone Encounter (Signed)
New referral placed for second opinion. Their office will contact pt to schedule.

## 2019-08-07 NOTE — Progress Notes (Signed)
08/08/19 4:01 PM   Kayla [name] November 13, 1970 [id number]  Referring provider: [name], DO [place],  [place] [address]  Chief Complaint  Patient presents with  . Urinary Frequency    HPI: 49 y.o. female presents today for the evaluation and management of urinary frequency.   -PCP visit on 05/07/19 c/o UTI sxs x 2 weeks  -Urine culture negative  -reports of pressure and urgency however little urine when she voids  -denies frequency, incontinence, and gross hematuria  -seen by urologist in [place]  -had a biopsy couple years ago and she was told of bacterial colonization   -treated with antibiotic suppression and symptoms worsened afterwards   PMH: Past Medical History:  Diagnosis Date  . Anemia   . Back pain   . Constipation   . Dyspnea   . Fatigue   . Joint pain   . Leg edema   . Obesity   . Sleep apnea    lost weight 2013 gastric sleeve, no longer a problem  . Sleep disturbance   . SVD (spontaneous vaginal delivery)    x 3  . Tobacco use     Surgical History: Past Surgical History:  Procedure Laterality Date  . BILATERAL SALPINGECTOMY Bilateral 09/27/2013   Procedure: BILATERAL SALPINGECTOMY;  Surgeon: Lahoma Crocker, MD;  Location: [facility];  Service: Gynecology;  Laterality: Bilateral;  . CESAREAN SECTION     x 1  . LAPAROTOMY Left 09/27/2013   Procedure: EXPLORATORY LAPAROTOMY W/ EXPLORATION OF LEFT ILLIAC ARTERY & VEIN;  Surgeon: Lahoma Crocker, MD;  Location: [facility];  Service: Gynecology;  Laterality: Left;  . ROBOTIC ASSISTED TOTAL HYSTERECTOMY N/A 09/27/2013   Procedure: ROBOTIC ASSISTED TOTAL HYSTERECTOMY;  Surgeon: Lahoma Crocker, MD;  Location: [facility];  Service: Gynecology;  Laterality: N/A;  . SLEEVE GASTROPLASTY  2013   in [place]    Home Medications:  Allergies as of 08/08/2019   No Known Allergies     Medication List       Accurate as of August 08, 2019  4:01 PM. If you have any questions, ask your nurse or  doctor.        amLODipine 2.5 MG tablet Commonly known as: NORVASC Take 1 tablet (2.5 mg total) by mouth daily.   cetirizine 10 MG tablet Commonly known as: ZYRTEC Take 1 tablet (10 mg total) by mouth daily.   fluticasone 50 MCG/ACT nasal spray Commonly known as: FLONASE PLACE 2 SPRAYS INTO BOTH NOSTRILS DAILY.   hydrochlorothiazide 25 MG tablet Commonly known as: HYDRODIURIL Take 1 tablet (25 mg total) by mouth daily.   mirabegron ER 25 MG Tb24 tablet Commonly known as: Myrbetriq Take 1 tablet (25 mg total) by mouth daily.   PARoxetine 10 MG tablet Commonly known as: Paxil Take 1 tablet (10 mg total) by mouth daily.       Allergies: No Known Allergies  Family History: Family History  Problem Relation Age of Onset  . Hypertension Mother   . Colon polyps Mother   . Hyperlipidemia Mother   . Colon cancer Maternal Grandfather 29  . Colon cancer Maternal Aunt        onset in 42s  . Stomach cancer Maternal Aunt   . Leukemia Paternal Grandmother   . Colon polyps Maternal Uncle   . Colon polyps Maternal Aunt     Social History:  reports that she has been smoking cigarettes. She has a 25.00 pack-year smoking history. She uses smokeless tobacco. She  reports current alcohol use. She reports that she does not use drugs.   Physical Exam: BP 129/84   Pulse 96   Ht 5\' 7"  (1.702 m)   Wt 243 lb (110.2 kg)   LMP 08/02/2013   BMI 38.06 kg/m   Constitutional:  Alert and oriented, No acute distress. HEENT: [place] AT, moist mucus membranes.  Trachea midline, no masses. Cardiovascular: No clubbing, cyanosis, or edema. Respiratory: Normal respiratory effort, no increased work of breathing. Skin: No rashes, bruises or suspicious lesions. Neurologic: Grossly intact, no focal deficits, moving all 4 extremities. Psychiatric: Normal mood and affect.  Laboratory Data:  Urinalysis UA today unremarkable   Assessment & Plan:    1. Overactive bladder  Requested records from  [facility] in [place] of [place] ER sent to pharmacy  Will f/u with pt after records reviewed    Abbie Sons, MD  [facility] [address] [place], [place] [address] [phone]  I, Lucas Mallow, am acting as a scribe for Dr. Nicki Reaper C. Tilton Marsalis,  I have reviewed the above documentation for accuracy and completeness, and I agree with the above.    Abbie Sons, MD

## 2019-08-08 ENCOUNTER — Other Ambulatory Visit: Payer: Self-pay | Admitting: Family Medicine

## 2019-08-08 ENCOUNTER — Encounter: Payer: Self-pay | Admitting: Urology

## 2019-08-08 ENCOUNTER — Other Ambulatory Visit: Payer: Self-pay

## 2019-08-08 ENCOUNTER — Ambulatory Visit: Payer: 59 | Admitting: Urology

## 2019-08-08 VITALS — BP 129/84 | HR 96 | Ht 67.0 in | Wt 243.0 lb

## 2019-08-08 DIAGNOSIS — R350 Frequency of micturition: Secondary | ICD-10-CM

## 2019-08-08 MED ORDER — TOLTERODINE TARTRATE ER 4 MG PO CP24: 4.0000 mg | ORAL_CAPSULE | Freq: Every day | ORAL | 1 refills | Status: DC

## 2019-08-08 MED FILL — AMLODIPINE 2.5 MG TABLET: 2.5 | 30 days supply | Qty: 30 | Fill #0

## 2019-08-08 MED FILL — TOLTERODINE TARTRATE ER 4 M: 4 | 30 days supply | Qty: 30 | Fill #0

## 2019-08-09 LAB — URINALYSIS, COMPLETE
Bilirubin, UA: NEGATIVE
Leukocytes,UA: NEGATIVE
Nitrite, UA: NEGATIVE
RBC, UA: NEGATIVE
Specific Gravity, UA: 1.025 (ref 1.005–1.030)
Urobilinogen, Ur: 0.2 mg/dL (ref 0.2–1.0)
pH, UA: 6.5 (ref 5.0–7.5)

## 2019-08-09 LAB — MICROSCOPIC EXAMINATION
Bacteria, UA: NONE SEEN
Epithelial Cells (non renal): >10 /hpf — AB (ref 0–10)

## 2019-08-15 ENCOUNTER — Telehealth: Payer: Self-pay | Admitting: *Deleted

## 2019-08-15 DIAGNOSIS — N390 Urinary tract infection, site not specified: Secondary | ICD-10-CM

## 2019-08-15 NOTE — Telephone Encounter (Signed)
Patient called Triage line-still having ongoing frequency, urgency. Denies fevers, chills, body aches. Has been having some pressure flank area. Has been taking Detrol as prescribed has not noticed any difference.  Received records from [facility]. Please advise.

## 2019-08-16 NOTE — Telephone Encounter (Signed)
[facility] records reviewed and she was seeing Dr. Matilde Sprang.  He last saw her in February 2021 and wanted her to follow-up for a cystoscopy and renal ultrasound which she did not do.  We will put in an order for renal ultrasound.  My cystoscopy slots are backed up so would schedule a follow-up cystoscopy with MacDiarmid

## 2019-08-28 ENCOUNTER — Ambulatory Visit (HOSPITAL_COMMUNITY)
Admission: RE | Admit: 2019-08-28 | Discharge: 2019-08-28 | Disposition: A | Discharge status: Home / Self Care | Payer: 59 | Source: Ambulatory Visit | Attending: Urology | Admitting: Urology

## 2019-08-28 ENCOUNTER — Other Ambulatory Visit: Payer: Self-pay

## 2019-08-28 DIAGNOSIS — N390 Urinary tract infection, site not specified: Secondary | ICD-10-CM | POA: Insufficient documentation

## 2019-08-28 DIAGNOSIS — R3989 Other symptoms and signs involving the genitourinary system: Secondary | ICD-10-CM | POA: Diagnosis not present

## 2019-08-29 ENCOUNTER — Telehealth: Payer: Self-pay | Admitting: *Deleted

## 2019-08-29 NOTE — Telephone Encounter (Signed)
-----   Message from Abbie Sons, MD sent at 08/28/2019  6:04 PM EDT ----- Renal ultrasound was normal

## 2019-09-02 ENCOUNTER — Ambulatory Visit (INDEPENDENT_AMBULATORY_CARE_PROVIDER_SITE_OTHER): Payer: 59 | Admitting: Urology

## 2019-09-02 ENCOUNTER — Encounter: Payer: Self-pay | Admitting: Urology

## 2019-09-02 ENCOUNTER — Other Ambulatory Visit: Payer: Self-pay

## 2019-09-02 VITALS — BP 124/88 | HR 90 | Wt 243.0 lb

## 2019-09-02 DIAGNOSIS — N3020 Other chronic cystitis without hematuria: Secondary | ICD-10-CM

## 2019-09-02 LAB — URINALYSIS, COMPLETE
Bilirubin, UA: NEGATIVE
Glucose, UA: NEGATIVE
Leukocytes,UA: NEGATIVE
Nitrite, UA: NEGATIVE
RBC, UA: NEGATIVE
Specific Gravity, UA: 1.025 (ref 1.005–1.030)
Urobilinogen, Ur: 1 mg/dL (ref 0.2–1.0)
pH, UA: 6.5 (ref 5.0–7.5)

## 2019-09-02 LAB — MICROSCOPIC EXAMINATION
Bacteria, UA: NONE SEEN
RBC, Urine: NONE SEEN /hpf (ref 0–2)

## 2019-09-02 NOTE — Progress Notes (Signed)
09/02/2019 9:53 AM   Kayla [name] 07-04-1970 [id number]  Referring provider: [name], DO [address] [place],  [place] [address]  Chief Complaint  Patient presents with  . Cysto    HPI: I saw this patient July 05, 2019 in [place].  She was having suprapubic pressure with negative cultures.  I saw her a few years previous and she did not get better on trimethoprim.  History was a bit limiting when I saw her.  Cystoscopically I thought she may have had bacterial cystitis evidence.  I placed her on Macrodantin.  I was going to perform cystoscopy that day.  My index of suspicion was moderate with her flareups that she could have interstitial cystitis.     Today Frequency stable.  She was placed on Detrol by my partner here in [place].  Her culture from [facility] was normal.  Ultrasound normal.  Patient no longer taking 1 today antibiotic.  Detrol has not helped.  She has a smoking history  Cystoscopy: Patient underwent flexible cystoscopy utilizing sterile technique.  At the posterior wall of her bladder there was an erythematous area almost like a thumbprint in size.  It was not raised but it was discrete and reddened.  There was no other mucosal abnormalities.  Trigone otherwise normal.  Prominent blood vessels.  No obvious carcinoma.  No sniffing pressure on bladder filling.  There was a lot of sediment in the urine.  Urine was normal but sent for culture with no microscopic hematuria   PMH: Past Medical History:  Diagnosis Date  . Anemia   . Back pain   . Constipation   . Dyspnea   . Fatigue   . Joint pain   . Leg edema   . Obesity   . Sleep apnea    lost weight 2013 gastric sleeve, no longer a problem  . Sleep disturbance   . SVD (spontaneous vaginal delivery)    x 3  . Tobacco use     Surgical History: Past Surgical History:  Procedure Laterality Date  . BILATERAL SALPINGECTOMY Bilateral 09/27/2013   Procedure: BILATERAL SALPINGECTOMY;   Surgeon: Lahoma Crocker, MD;  Location: [facility];  Service: Gynecology;  Laterality: Bilateral;  . CESAREAN SECTION     x 1  . LAPAROTOMY Left 09/27/2013   Procedure: EXPLORATORY LAPAROTOMY W/ EXPLORATION OF LEFT ILLIAC ARTERY & VEIN;  Surgeon: Lahoma Crocker, MD;  Location: [facility];  Service: Gynecology;  Laterality: Left;  . ROBOTIC ASSISTED TOTAL HYSTERECTOMY N/A 09/27/2013   Procedure: ROBOTIC ASSISTED TOTAL HYSTERECTOMY;  Surgeon: Lahoma Crocker, MD;  Location: [facility];  Service: Gynecology;  Laterality: N/A;  . SLEEVE GASTROPLASTY  2013   in [place]    Home Medications:  Allergies as of 09/02/2019   No Known Allergies     Medication List       Accurate as of September 02, 2019  9:53 AM. If you have any questions, ask your nurse or doctor.        STOP taking these medications   mirabegron ER 25 MG Tb24 tablet Commonly known as: Myrbetriq Stopped by: Reece Packer, MD     TAKE these medications   amLODipine 2.5 MG tablet Commonly known as: NORVASC TAKE 1 TABLET (2.5 MG TOTAL) BY MOUTH DAILY.   cetirizine 10 MG tablet Commonly known as: ZYRTEC Take 1 tablet (10 mg total) by mouth daily.   fluticasone 50 MCG/ACT nasal spray Commonly known as: FLONASE PLACE 2 SPRAYS INTO BOTH  NOSTRILS DAILY.   hydrochlorothiazide 25 MG tablet Commonly known as: HYDRODIURIL Take 1 tablet (25 mg total) by mouth daily.   PARoxetine 10 MG tablet Commonly known as: Paxil Take 1 tablet (10 mg total) by mouth daily.   tolterodine 4 MG 24 hr capsule Commonly known as: DETROL LA Take 4 mg by mouth daily. What changed: Another medication with the same name was removed. Continue taking this medication, and follow the directions you see here. Changed by: Reece Packer, MD       Allergies: No Known Allergies  Family History: Family History  Problem Relation Age of Onset  . Hypertension Mother   . Colon polyps Mother   . Hyperlipidemia Mother   . Colon cancer Maternal  Grandfather 62  . Colon cancer Maternal Aunt        onset in 64s  . Stomach cancer Maternal Aunt   . Leukemia Paternal Grandmother   . Colon polyps Maternal Uncle   . Colon polyps Maternal Aunt     Social History:  reports that she has been smoking cigarettes. She has a 25.00 pack-year smoking history. She uses smokeless tobacco. She reports current alcohol use. She reports that she does not use drugs.  ROS:                                        Physical Exam: BP 124/88   Pulse 90   Wt 243 lb (110.2 kg)   LMP 08/02/2013   BMI 38.06 kg/m   Constitutional:  Alert and oriented, No acute distress.   Laboratory Data: Lab Results  Component Value Date   WBC 7.2 02/07/2018   HGB 12.3 02/07/2018   HCT 40.4 02/07/2018   MCV 90.4 02/07/2018   PLT 252 02/07/2018    Lab Results  Component Value Date   CREATININE 0.72 05/07/2019    No results found for: PSA  No results found for: TESTOSTERONE  Lab Results  Component Value Date   HGBA1C 6.5 05/07/2019    Urinalysis    Component Value Date/Time   COLORURINE AMBER (A) 02/07/2018 1005   APPEARANCEUR Cloudy (A) 08/08/2019 1441   LABSPEC 1.026 02/07/2018 1005   PHURINE 5.0 02/07/2018 1005   GLUCOSEU Trace (A) 08/08/2019 1441   HGBUR NEGATIVE 02/07/2018 1005   BILIRUBINUR Negative 08/08/2019 1441   KETONESUR 5 (A) 02/07/2018 1005   PROTEINUR Trace (A) 08/08/2019 1441   PROTEINUR NEGATIVE 02/07/2018 1005   UROBILINOGEN 0.2 05/07/2019 0921   UROBILINOGEN 1.0 09/29/2014 0838   NITRITE Negative 08/08/2019 1441   NITRITE NEGATIVE 02/07/2018 1005   LEUKOCYTESUR Negative 08/08/2019 1441    Pertinent Imaging:   Assessment & Plan: Patient still has to pubic pressure.  There is 1 erythematous area in the bladder.  With a smoking history I think she should have a bladder hydrodistention and if the lesion is still present I would biopsy and fulgurate.  I do not think it is an ulcer and likely represents  some cystitis.  She understands the concept of carcinoma in situ but my index of suspicion is lower.  Pros cons risk described.  Bladder perforation sequelae described.  There are no diagnoses linked to this encounter.  No follow-ups on file.  Reece Packer, MD  [facility] [address] [place], [place] [address] [phone]

## 2019-09-03 ENCOUNTER — Telehealth: Payer: Self-pay

## 2019-09-03 NOTE — Telephone Encounter (Signed)
Patient called with a follow up question from her cysto visit with Dr Matilde Sprang yesterday. Patient wants to know if Dr. 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 Please advise.

## 2019-09-04 LAB — CULTURE, URINE COMPREHENSIVE

## 2019-09-04 NOTE — Telephone Encounter (Signed)
No restrictions

## 2019-09-20 ENCOUNTER — Other Ambulatory Visit: Payer: Self-pay | Admitting: Urology

## 2019-09-20 DIAGNOSIS — N3020 Other chronic cystitis without hematuria: Secondary | ICD-10-CM

## 2019-09-23 ENCOUNTER — Inpatient Hospital Stay: Admission: RE | Admit: 2019-09-23 | Payer: 59 | Source: Ambulatory Visit

## 2019-09-23 ENCOUNTER — Telehealth: Payer: Self-pay | Admitting: Urology

## 2019-09-23 NOTE — Telephone Encounter (Signed)
Pt left vm she states she has a procedure scheduled 09/30/19 and someone  Was supposed to call her about what medicines to take and questions regarding her procedure she states she has not heard from anyone please call pt ok to leave message she may be at work

## 2019-09-24 ENCOUNTER — Other Ambulatory Visit: Payer: Self-pay

## 2019-09-24 ENCOUNTER — Telehealth: Payer: Self-pay | Admitting: Urology

## 2019-09-24 ENCOUNTER — Encounter
Admission: RE | Admit: 2019-09-24 | Discharge: 2019-09-24 | Disposition: A | Discharge status: Home / Self Care | Payer: 59 | Source: Ambulatory Visit | Attending: Urology | Admitting: Urology

## 2019-09-24 HISTORY — DX: Essential (primary) hypertension: I10

## 2019-09-24 NOTE — Telephone Encounter (Signed)
Talked to pt. Yesterday she stated PreAdmit Testing was scheduled to call her between 8am-1:00pm  no one had called her and it was 12:40. Pt works second shift and would not have access to her phone. I did call preadmit to let them know the situation. I then spoke to Mariel Aloe RN from [employer] and she stated she left the pt. A message on her cell phone before lunch and that she would continue to try and reach pt. She said she would also put a note in for someone to call her early if she moved her appointment to Tuesday 09/24/19. I called pt. At 10:50am and no one had called her at that time. I called preadmit and they will call her as soon as possible.

## 2019-09-24 NOTE — Patient Instructions (Signed)
Your procedure is scheduled on: Monday 09/30/19.  Report to DAY SURGERY DEPARTMENT LOCATED ON 2ND FLOOR MEDICAL MALL ENTRANCE. To find out your arrival time please call [phone] between 1PM - 3PM on Friday 09/27/19.    Remember: Instructions that are not followed completely may result in serious medical risk, up to and including death, or upon the discretion of your surgeon and anesthesiologist your surgery may need to be rescheduled.      _X__ 1. Do not eat food after midnight the night before your procedure.                 No gum chewing or hard candies. You may drink clear liquids up to 2 hours                 before you are scheduled to arrive for your surgery- DO NOT drink clear                 liquids within 2 hours of the start of your surgery.                 Clear Liquids include:  water, apple juice without pulp, clear carbohydrate                 drink such as Clearfast or Gatorade, Black Coffee or Tea (Do not add                 anything to coffee or tea).    __X__2.  On the morning of surgery brush your teeth with toothpaste and water, you may rinse your mouth with mouthwash if you wish.  Do not swallow any toothpaste or mouthwash.       _X__ 3.  No Alcohol for 24 hours before or after surgery.     _X__ 4.  Do Not Smoke or use e-cigarettes For 24 Hours Prior to Your Surgery.                 Do not use any chewable tobacco products for at least 6 hours prior to                 surgery.    __X__5.  Notify your doctor if there is any change in your medical condition      (cold, fever, infections).       Do not wear jewelry, make-up, hairpins, clips or nail polish. Do not wear lotions, powders, or perfumes.  Do not shave 48 hours prior to surgery. Men may shave face and neck. Do not bring valuables to the hospital.     [facility] is not responsible for any belongings or valuables.   Contacts, dentures/partials or body piercings may not be worn into  surgery. Bring a case for your contacts, glasses or hearing aids, a denture cup will be supplied.    Patients discharged the day of surgery will not be allowed to drive home.     __X__ Take these medicines the morning of surgery with A SIP OF WATER:     1. amLODipine (NORVASC)  2. cetirizine (ZYRTEC) if needed  3. fluticasone (FLONASE) if needed     __X__ Stop Anti-inflammatories 7 days before surgery such as Advil, Ibuprofen, Motrin, BC or Goodies Powder, Naprosyn, Naproxen, Aleve, Aspirin, Meloxicam. May take Tylenol if needed for pain or discomfort.     __X__ Don't start taking any new herbal supplements or vitamins prior to your procedure.

## 2019-09-24 NOTE — Pre-Procedure Instructions (Signed)
REGARDING PRE-SURGICAL LABS  PAT call completed. Patient requested labs and EKG to be done at [facility]. This was arranged for her. Appointment is scheduled for Wednesday 09/25/19 at 1000 at [facility] in [place]. Patient was called and made aware. Questions were encouraged and answered. Understanding verbalized. Patient is very satisfied with this arrangement.

## 2019-09-25 ENCOUNTER — Encounter (HOSPITAL_COMMUNITY)
Admission: RE | Admit: 2019-09-25 | Discharge: 2019-09-25 | Disposition: A | Discharge status: Home / Self Care | Payer: 59 | Source: Ambulatory Visit | Attending: Urology | Admitting: Urology

## 2019-09-25 ENCOUNTER — Other Ambulatory Visit: Payer: Self-pay

## 2019-09-25 DIAGNOSIS — Z01818 Encounter for other preprocedural examination: Secondary | ICD-10-CM | POA: Diagnosis not present

## 2019-09-25 LAB — CBC
HCT: 40 % (ref 36.0–46.0)
Hemoglobin: 12.4 g/dL (ref 12.0–15.0)
MCH: 27.9 pg (ref 26.0–34.0)
MCHC: 31 g/dL (ref 30.0–36.0)
MCV: 90.1 fL (ref 80.0–100.0)
Platelets: 275 10*3/uL (ref 150–400)
RBC: 4.44 MIL/uL (ref 3.87–5.11)
RDW: 13.6 % (ref 11.5–15.5)
WBC: 7.9 10*3/uL (ref 4.0–10.5)
nRBC: 0 % (ref 0.0–0.2)

## 2019-09-25 LAB — BASIC METABOLIC PANEL
Anion gap: 8 (ref 5–15)
BUN: 17 mg/dL (ref 6–20)
CO2: 26 mmol/L (ref 22–32)
Calcium: 9 mg/dL (ref 8.9–10.3)
Chloride: 107 mmol/L (ref 98–111)
Creatinine, Ser: 0.78 mg/dL (ref 0.44–1.00)
GFR calc Af Amer: >60 mL/min (ref >60)
GFR calc non Af Amer: >60 mL/min (ref >60)
Glucose, Bld: 91 mg/dL (ref 70–99)
Potassium: 3.8 mmol/L (ref 3.5–5.1)
Sodium: 141 mmol/L (ref 135–145)

## 2019-09-25 NOTE — Pre-Procedure Instructions (Signed)
Pre-Admit Testing Provider Notification Note  Provider Notified: Dr. Colin Benton (PCP)  Notification Mode:  1) Fax 2) Confirmed the correct fax number with the office.  Reason: Medical Clearance Request  Response:  1) Fax Confirmation received.   Additional Information: Placed on chart.  Signed: Beulah Gandy, RN

## 2019-09-26 ENCOUNTER — Other Ambulatory Visit (HOSPITAL_COMMUNITY): Payer: 59 | Attending: Urology

## 2019-09-27 ENCOUNTER — Other Ambulatory Visit (HOSPITAL_COMMUNITY)
Admission: RE | Admit: 2019-09-27 | Discharge: 2019-09-27 | Disposition: A | Discharge status: Home / Self Care | Payer: 59 | Source: Ambulatory Visit | Attending: Urology | Admitting: Urology

## 2019-09-27 DIAGNOSIS — Z01812 Encounter for preprocedural laboratory examination: Secondary | ICD-10-CM | POA: Diagnosis not present

## 2019-09-27 DIAGNOSIS — Z20822 Contact with and (suspected) exposure to covid-19: Secondary | ICD-10-CM | POA: Diagnosis not present

## 2019-09-27 LAB — SARS CORONAVIRUS 2 (TAT 6-24 HRS): SARS Coronavirus 2: NEGATIVE

## 2019-09-27 NOTE — Pre-Procedure Instructions (Addendum)
SECURE CHAT WITH DR Randa Lynn:  Pt is having cysto with bladder bx on 5-24 with MacDiarmid. Pt had abnormal Ekg done on 09-25-19 and Dr Andree Elk asked for clearance. Since then cardiology has read ekg and Dr Johnsie Cancel (cardiology) confirmed on Ekg when he read it that there was no significant change since 2019 ekg. Pt's pcp does not see pt's anymore so pt is having to find a new pcp and ultimately pt's surgery for 5-24 will have to be cancelled. Do we still need this clearance since the cardiologist read ekg as no significant change from her 2019 ekg? You can see in Epic ekg where Dr Jenkins Rouge read ekg done on 5-19  I agree, I don't see a significant change either. I think it's ok to proceed without having cardiology see her as long as she's also not having any cardiac symptoms  Ok great I will call pt and make sure she is not having any cardiac symptoms. Thanks!  UPDATE 09-27-19 @ 1400: REACHED OUT TO PT TO MAKE SURE SHE WAS NOT HAVING ANY CARDIAC ISSUES. SHE DENIED SOB OR CP AND CAN WALK A MILE WITHOUT GETTING SOB OR HAVING CP.

## 2019-09-30 ENCOUNTER — Ambulatory Visit: Payer: 59 | Admitting: Anesthesiology

## 2019-09-30 ENCOUNTER — Encounter: Payer: Self-pay | Admitting: Urology

## 2019-09-30 ENCOUNTER — Encounter
Admission: RE | Disposition: A | Discharge status: Home / Self Care | Payer: Self-pay | Source: Home / Self Care | Attending: Urology

## 2019-09-30 ENCOUNTER — Other Ambulatory Visit: Payer: Self-pay

## 2019-09-30 ENCOUNTER — Ambulatory Visit
Admission: RE | Admit: 2019-09-30 | Discharge: 2019-09-30 | Disposition: A | Discharge status: Home / Self Care | Payer: 59 | Attending: Urology | Admitting: Urology

## 2019-09-30 DIAGNOSIS — N3020 Other chronic cystitis without hematuria: Secondary | ICD-10-CM

## 2019-09-30 DIAGNOSIS — Z79899 Other long term (current) drug therapy: Secondary | ICD-10-CM | POA: Insufficient documentation

## 2019-09-30 DIAGNOSIS — Z6838 Body mass index (BMI) 38.0-38.9, adult: Secondary | ICD-10-CM | POA: Insufficient documentation

## 2019-09-30 DIAGNOSIS — L539 Erythematous condition, unspecified: Secondary | ICD-10-CM | POA: Diagnosis not present

## 2019-09-30 DIAGNOSIS — G4730 Sleep apnea, unspecified: Secondary | ICD-10-CM | POA: Insufficient documentation

## 2019-09-30 DIAGNOSIS — F17200 Nicotine dependence, unspecified, uncomplicated: Secondary | ICD-10-CM | POA: Diagnosis not present

## 2019-09-30 DIAGNOSIS — Z8249 Family history of ischemic heart disease and other diseases of the circulatory system: Secondary | ICD-10-CM | POA: Diagnosis not present

## 2019-09-30 DIAGNOSIS — E669 Obesity, unspecified: Secondary | ICD-10-CM | POA: Diagnosis not present

## 2019-09-30 DIAGNOSIS — I10 Essential (primary) hypertension: Secondary | ICD-10-CM | POA: Diagnosis not present

## 2019-09-30 DIAGNOSIS — N3090 Cystitis, unspecified without hematuria: Secondary | ICD-10-CM | POA: Diagnosis not present

## 2019-09-30 DIAGNOSIS — N3289 Other specified disorders of bladder: Secondary | ICD-10-CM

## 2019-09-30 HISTORY — PX: CYSTO WITH HYDRODISTENSION: SHX5453

## 2019-09-30 HISTORY — PX: CYSTOSCOPY WITH BIOPSY: SHX5122

## 2019-09-30 SURGERY — CYSTOSCOPY, WITH BLADDER HYDRODISTENSION
Anesthesia: General

## 2019-09-30 MED ORDER — PROPOFOL 10 MG/ML IV BOLUS
INTRAVENOUS | Status: AC
  Filled 2019-09-30: qty 20

## 2019-09-30 MED ORDER — ONDANSETRON HCL 4 MG/2ML IJ SOLN
INTRAMUSCULAR | Status: AC
  Filled 2019-09-30: qty 2

## 2019-09-30 MED ORDER — LIDOCAINE HCL (PF) 2 % IJ SOLN
INTRAMUSCULAR | Status: AC
  Filled 2019-09-30: qty 5

## 2019-09-30 MED ORDER — ONDANSETRON HCL 4 MG/2ML IJ SOLN
INTRAMUSCULAR | Status: DC | PRN
  Administered 2019-09-30: 4 mg via INTRAVENOUS

## 2019-09-30 MED ORDER — FAMOTIDINE 20 MG PO TABS: 20.0000 mg | ORAL_TABLET | Freq: Once | ORAL | Status: AC

## 2019-09-30 MED ORDER — METHYLENE BLUE 0.5 % INJ SOLN
INTRAVENOUS | Status: AC
  Filled 2019-09-30: qty 10

## 2019-09-30 MED ORDER — LIDOCAINE HCL (CARDIAC) PF 100 MG/5ML IV SOSY
PREFILLED_SYRINGE | INTRAVENOUS | Status: DC | PRN
  Administered 2019-09-30: 50 mg via INTRAVENOUS

## 2019-09-30 MED ORDER — FAMOTIDINE 20 MG PO TABS
ORAL_TABLET | ORAL | Status: AC
  Administered 2019-09-30: 20 mg via ORAL
  Filled 2019-09-30: qty 1

## 2019-09-30 MED ORDER — MIDAZOLAM HCL 2 MG/2ML IJ SOLN
INTRAMUSCULAR | Status: AC
  Filled 2019-09-30: qty 2

## 2019-09-30 MED ORDER — CEFAZOLIN SODIUM-DEXTROSE 2-4 GM/100ML-% IV SOLN
INTRAVENOUS | Status: AC
  Filled 2019-09-30: qty 100

## 2019-09-30 MED ORDER — FENTANYL CITRATE (PF) 100 MCG/2ML IJ SOLN
25.0000 ug | INTRAMUSCULAR | Status: DC | PRN
  Administered 2019-09-30 (×2): 50 ug via INTRAVENOUS

## 2019-09-30 MED ORDER — MIDAZOLAM HCL 2 MG/2ML IJ SOLN
INTRAMUSCULAR | Status: DC | PRN
  Administered 2019-09-30: 2 mg via INTRAVENOUS

## 2019-09-30 MED ORDER — BUPIVACAINE HCL (PF) 0.5 % IJ SOLN
INTRAMUSCULAR | Status: AC
  Filled 2019-09-30: qty 60

## 2019-09-30 MED ORDER — ONDANSETRON HCL 4 MG/2ML IJ SOLN
4.0000 mg | Freq: Once | INTRAMUSCULAR | Status: AC | PRN
  Administered 2019-09-30: 4 mg via INTRAVENOUS

## 2019-09-30 MED ORDER — CEFAZOLIN SODIUM-DEXTROSE 2-4 GM/100ML-% IV SOLN
2.0000 g | INTRAVENOUS | Status: AC
  Administered 2019-09-30: 2 g via INTRAVENOUS

## 2019-09-30 MED ORDER — OXYCODONE HCL 5 MG PO TABS: 5.0000 mg | ORAL_TABLET | Freq: Once | ORAL | Status: DC | PRN

## 2019-09-30 MED ORDER — FENTANYL CITRATE (PF) 100 MCG/2ML IJ SOLN
INTRAMUSCULAR | Status: DC | PRN
  Administered 2019-09-30 (×2): 25 ug via INTRAVENOUS

## 2019-09-30 MED ORDER — FENTANYL CITRATE (PF) 100 MCG/2ML IJ SOLN
INTRAMUSCULAR | Status: AC
  Filled 2019-09-30: qty 2

## 2019-09-30 MED ORDER — LIDOCAINE HCL URETHRAL/MUCOSAL 2 % EX GEL
CUTANEOUS | Status: AC
  Filled 2019-09-30: qty 10

## 2019-09-30 MED ORDER — DEXAMETHASONE SODIUM PHOSPHATE 10 MG/ML IJ SOLN
INTRAMUSCULAR | Status: AC
  Filled 2019-09-30: qty 1

## 2019-09-30 MED ORDER — BELLADONNA ALKALOIDS-OPIUM 16.2-60 MG RE SUPP
RECTAL | Status: AC
  Filled 2019-09-30: qty 1

## 2019-09-30 MED ORDER — PROPOFOL 10 MG/ML IV BOLUS
INTRAVENOUS | Status: DC | PRN
  Administered 2019-09-30: 150 mg via INTRAVENOUS

## 2019-09-30 MED ORDER — LABETALOL HCL 5 MG/ML IV SOLN
INTRAVENOUS | Status: AC
  Filled 2019-09-30: qty 4

## 2019-09-30 MED ORDER — ACETAMINOPHEN 10 MG/ML IV SOLN: 1000.0000 mg | Freq: Once | INTRAVENOUS | Status: DC | PRN

## 2019-09-30 MED ORDER — LACTATED RINGERS IV SOLN: INTRAVENOUS | Status: DC

## 2019-09-30 MED ORDER — HYDROCODONE-ACETAMINOPHEN 5-325 MG PO TABS: 1.0000 | ORAL_TABLET | Freq: Four times a day (QID) | ORAL | 0 refills | Status: DC | PRN

## 2019-09-30 MED ORDER — OXYCODONE HCL 5 MG/5ML PO SOLN: 5.0000 mg | Freq: Once | ORAL | Status: DC | PRN

## 2019-09-30 MED ORDER — DEXAMETHASONE SODIUM PHOSPHATE 10 MG/ML IJ SOLN
INTRAMUSCULAR | Status: DC | PRN
  Administered 2019-09-30: 8 mg via INTRAVENOUS

## 2019-09-30 MED ORDER — LABETALOL HCL 5 MG/ML IV SOLN
5.0000 mg | INTRAVENOUS | Status: AC | PRN
  Administered 2019-09-30 (×2): 5 mg via INTRAVENOUS

## 2019-09-30 MED FILL — HYDROCODON-APAP 5-325: 5-325 | 2 days supply | Qty: 10 | Fill #0

## 2019-09-30 SURGICAL SUPPLY — 22 items
BAG DRAIN CYSTO-URO LG1000N (MISCELLANEOUS) ×2 IMPLANT
BRUSH SCRUB EZ 1% IODOPHOR (MISCELLANEOUS) ×2 IMPLANT
CATH FOLEY 2WAY  5CC 16FR (CATHETERS)
CATH ROBINSON RED A/P 16FR (CATHETERS) ×2 IMPLANT
CATH URTH 16FR FL 2W BLN LF (CATHETERS) ×1 IMPLANT
COVER WAND RF STERILE (DRAPES) ×2 IMPLANT
DRAPE UNDER BUTTOCK W/FLU (DRAPES) IMPLANT
ELECT REM PT RETURN 9FT ADLT (ELECTROSURGICAL) ×2
ELECTRODE REM PT RTRN 9FT ADLT (ELECTROSURGICAL) ×1 IMPLANT
GLOVE BIO SURGEON STRL SZ 6.5 (GLOVE) ×3 IMPLANT
GLOVE BIOGEL M STRL SZ7.5 (GLOVE) ×3 IMPLANT
GOWN STRL REUS W/TWL XL LVL3 (GOWN DISPOSABLE) ×2 IMPLANT
NDL HYPO 25X1 1.5 SAFETY (NEEDLE) IMPLANT
NEEDLE HYPO 25X1 1.5 SAFETY (NEEDLE) ×2 IMPLANT
PACK CYSTO AR (MISCELLANEOUS) ×2 IMPLANT
SET CYSTO W/LG BORE CLAMP LF (SET/KITS/TRAYS/PACK) ×2 IMPLANT
SURGILUBE 2OZ TUBE FLIPTOP (MISCELLANEOUS) ×2 IMPLANT
SYR 10ML LL (SYRINGE) ×2 IMPLANT
SYR 30ML LL (SYRINGE) ×2 IMPLANT
SYR TOOMEY 50ML (SYRINGE) ×1 IMPLANT
TUBING CONNECTING 10 (TUBING) ×2 IMPLANT
WATER STERILE IRR 3000ML UROMA (IV SOLUTION) ×2 IMPLANT

## 2019-09-30 NOTE — Progress Notes (Signed)
CV exam normal REsp exam normal Pros and cons and post op discussed

## 2019-09-30 NOTE — Discharge Instructions (Addendum)
I have reviewed discharge instructions in detail with the patient. They will follow-up with me or their physician as scheduled. My nurse will also be calling the patients as per protocol.   Resume normal activity tomorrow Drink plenty of fluids for any blood in urine     AMBULATORY SURGERY  DISCHARGE INSTRUCTIONS   1) The drugs that you were given will stay in your system until tomorrow so for the next 24 hours you should not:  A) Drive an automobile B) Make any legal decisions C) Drink any alcoholic beverage   2) You may resume regular meals tomorrow.  Today it is better to start with liquids and gradually work up to solid foods.  You may eat anything you prefer, but it is better to start with liquids, then soup and crackers, and gradually work up to solid foods.   3) Please notify your doctor immediately if you have any unusual bleeding, trouble breathing, redness and pain at the surgery site, drainage, fever, or pain not relieved by medication.    4) Additional Instructions:        Please contact your physician with any problems or Same Day Surgery at [phone], Monday through Friday 6 am to 4 pm, or [place] at [facility] number at [phone].

## 2019-09-30 NOTE — H&P (Signed)
I saw this patient July 05, 2019 in [place]. She was having suprapubic pressure with negative cultures. I saw her a few years previous and she did not get better on trimethoprim. History was a bit limiting when I saw her. Cystoscopically I thought she may have had bacterial cystitis evidence. I placed her on Macrodantin. I was going to perform cystoscopy that day. My index of suspicion was moderate with her flareups that she could have interstitial cystitis.  Today  Frequency stable. She was placed on Detrol by my partner here in [place]. Her culture from [facility] was normal. Ultrasound normal.  Patient no longer taking 1 today antibiotic. Detrol has not helped. She has a smoking history  Cystoscopy: Patient underwent flexible cystoscopy utilizing sterile technique. At the posterior wall of her bladder there was an erythematous area almost like a thumbprint in size. It was not raised but it was discrete and reddened. There was no other mucosal abnormalities. Trigone otherwise normal. Prominent blood vessels. No obvious carcinoma. No sniffing pressure on bladder filling. There was a lot of sediment in the urine. Urine was normal but sent for culture with no microscopic hematuria  PMH:      Past Medical History:  Diagnosis Date  . Anemia   . Back pain   . Constipation   . Dyspnea   . Fatigue   . Joint pain   . Leg edema   . Obesity   . Sleep apnea    lost weight 2013 gastric sleeve, no longer a problem  . Sleep disturbance   . SVD (spontaneous vaginal delivery)    x 3  . Tobacco use    Surgical History:       Past Surgical History:  Procedure Laterality Date  . BILATERAL SALPINGECTOMY Bilateral 09/27/2013   Procedure: BILATERAL SALPINGECTOMY; Surgeon: Lahoma Crocker, MD; Location: [facility]; Service: Gynecology; Laterality: Bilateral;  . CESAREAN SECTION     x 1  . LAPAROTOMY Left 09/27/2013   Procedure: EXPLORATORY LAPAROTOMY W/ EXPLORATION OF LEFT ILLIAC ARTERY & VEIN;  Surgeon: Lahoma Crocker, MD; Location: [facility]; Service: Gynecology; Laterality: Left;  . ROBOTIC ASSISTED TOTAL HYSTERECTOMY N/A 09/27/2013   Procedure: ROBOTIC ASSISTED TOTAL HYSTERECTOMY; Surgeon: Lahoma Crocker, MD; Location: [facility]; Service: Gynecology; Laterality: N/A;  . SLEEVE GASTROPLASTY  2013   in [place]   Home Medications:  Allergies as of 09/02/2019   No Known Allergies          Medication List       Accurate as of September 02, 2019 9:53 AM. If you have any questions, ask your nurse or doctor.        STOP taking these medications    mirabegron ER 25 MG Tb24 tablet  Commonly known as: Myrbetriq  Stopped by: Reece Packer, MD       TAKE these medications    amLODipine 2.5 MG tablet  Commonly known as: NORVASC  TAKE 1 TABLET (2.5 MG TOTAL) BY MOUTH DAILY.   cetirizine 10 MG tablet  Commonly known as: ZYRTEC  Take 1 tablet (10 mg total) by mouth daily.   fluticasone 50 MCG/ACT nasal spray  Commonly known as: FLONASE  PLACE 2 SPRAYS INTO BOTH NOSTRILS DAILY.   hydrochlorothiazide 25 MG tablet  Commonly known as: HYDRODIURIL  Take 1 tablet (25 mg total) by mouth daily.   PARoxetine 10 MG tablet  Commonly known as: Paxil  Take 1 tablet (10 mg total) by mouth daily.   tolterodine 4 MG 24 hr  capsule  Commonly known as: DETROL LA  Take 4 mg by mouth daily.  What changed: Another medication with the same name was removed. Continue taking this medication, and follow the directions you see here.  Changed by: Reece Packer, MD       Allergies: No Known Allergies  Family History:       Family History  Problem Relation Age of Onset  . Hypertension Mother   . Colon polyps Mother   . Hyperlipidemia Mother   . Colon cancer Maternal Grandfather 65  . Colon cancer Maternal Aunt    onset in 83s  . Stomach cancer Maternal Aunt   . Leukemia Paternal Grandmother   . Colon polyps Maternal Uncle   . Colon polyps Maternal Aunt    Social History: reports  that she has been smoking cigarettes. She has a 25.00 pack-year smoking history. She uses smokeless tobacco. She reports current alcohol use. She reports that she does not use drugs.  ROS:               Physical Exam:  BP 124/88  Pulse 90  Wt 243 lb (110.2 kg)  LMP 08/02/2013  BMI 38.06 kg/m  Constitutional: Alert and oriented, No acute distress.  Laboratory Data:  Recent Labs                                               Recent Labs                       Recent Labs     Recent Labs     Recent Labs                      Urinalysis  Labs (Brief)                                                                                                          Pertinent Imaging:  Assessment & Plan: Patient still has to pubic pressure. There is 1 erythematous area in the bladder. With a smoking history I think she should have a bladder hydrodistention and if the lesion is still present I would biopsy and fulgurate. I do not think it is an ulcer and likely represents some cystitis. She understands the concept of carcinoma in situ but my index of suspicion is lower. Pros cons risk described. Bladder perforation sequelae described.

## 2019-09-30 NOTE — Transfer of Care (Signed)
Immediate Anesthesia Transfer of Care Note  Patient: Kayla [name]  Procedure(s) Performed: CYSTOSCOPY/HYDRODISTENSION (N/A ) CYSTOSCOPY WITH BLADDER BIOPSY (N/A )  Patient Location: PACU  Anesthesia Type:General  Level of Consciousness: drowsy and patient cooperative  Airway & Oxygen Therapy: Patient Spontanous Breathing and Patient connected to face mask oxygen  Post-op Assessment: Report given to RN and Post -op Vital signs reviewed and stable  Post vital signs: Reviewed and stable  Last Vitals:  Vitals Value Taken Time  BP 149/92 09/30/19 1458  Temp 36.2 C 09/30/19 1430  Pulse 73 09/30/19 1504  Resp 13 09/30/19 1504  SpO2 93 % 09/30/19 1504  Vitals shown include unvalidated device data.  Last Pain:  Vitals:   09/30/19 1455  TempSrc:   PainSc: 4          Complications: No apparent anesthesia complications

## 2019-09-30 NOTE — Anesthesia Procedure Notes (Signed)
Procedure Name: LMA Insertion Date/Time: 09/30/2019 1:50 PM Performed by: Jonna Clark, CRNA Pre-anesthesia Checklist: Patient identified, Patient being monitored, Timeout performed, Emergency Drugs available and Suction available Patient Re-evaluated:Patient Re-evaluated prior to induction Oxygen Delivery Method: Circle system utilized Preoxygenation: Pre-oxygenation with 100% oxygen Induction Type: IV induction Ventilation: Mask ventilation without difficulty LMA: LMA inserted LMA Size: 4.0 Tube type: Oral Number of attempts: 1 Placement Confirmation: positive ETCO2 and breath sounds checked- equal and bilateral Tube secured with: Tape Dental Injury: Teeth and Oropharynx as per pre-operative assessment

## 2019-09-30 NOTE — Op Note (Signed)
Preoperative diagnosis: Chronic cystitis Postoperative diagnosis: Chronic cystitis with mild bladder erythema Surgery: Cystoscopy and bladder hydrodistention and bladder biopsy and fulguration Surgeon: Dr. Nicki Reaper Shir Bergman  The patient has the above diagnosis and consented the above procedure.  Extra care was taken with leg positioning.  21 French cystoscope was utilized.  Preoperative antibiotics were given.  Bladder mucosa and trigone were normal.  Having said that she is very prominent blood vessels and at low bladder volume there appeared to be mild erythematous hue in the lower third of the bladder posteriorly.  As I distended the bladder further the findings became more in keeping with normal bladder mucosa.  I still felt it was best to biopsy the area in question as it was mildly different from the rest of the bladder especially the lateral walls.  2 superficial biopsies were taken with the bladder at modest volume with cold cup biopsy forceps.  There was no bladder perforation.  The area was fulgurated and triple checked.    Before the biopsy hydrodistended to 550 mL.  I emptied the bladder.  When I reexamined there were no glomerulations or findings in keeping with interstitial cystitis.  There was no ulcers.  The patient I suspect will have negative biopsies.  I do not think she has carcinoma in situ.  She had no evidence of interstitial cystitis.  She will be followed as per protocol

## 2019-09-30 NOTE — Anesthesia Preprocedure Evaluation (Signed)
Anesthesia Evaluation  Patient identified by MRN, date of birth, ID band Patient awake    Reviewed: Allergy & Precautions, NPO status , Patient's Chart, lab work & pertinent test results  History of Anesthesia Complications Negative for: history of anesthetic complications  Airway Mallampati: II  TM Distance: >3 FB Neck ROM: Full    Dental no notable dental hx. (+) Teeth Intact, Dental Advisory Given   Pulmonary neg pulmonary ROS, sleep apnea , neg COPD, Current Smoker and Patient abstained from smoking.,  Doesn't wear cpap   Pulmonary exam normal breath sounds clear to auscultation       Cardiovascular Exercise Tolerance: Good METShypertension, (-) CAD and (-) Past MI negative cardio ROS  (-) dysrhythmias  Rhythm:Regular Rate:Normal - Systolic murmurs    Neuro/Psych  Headaches, negative neurological ROS  negative psych ROS   GI/Hepatic neg GERD  ,(+)     (-) substance abuse  ,   Endo/Other  neg diabetes  Renal/GU negative Renal ROS     Musculoskeletal   Abdominal (+) + obese,   Peds  Hematology  (+) anemia ,   Anesthesia Other Findings Past Medical History: No date: Anemia No date: Back pain No date: Constipation No date: Dyspnea No date: Fatigue No date: Headache No date: Hypertension No date: Joint pain No date: Leg edema No date: Obesity No date: Sleep apnea     Comment:  lost weight 2013 gastric sleeve, no longer a problem No date: Sleep disturbance No date: SVD (spontaneous vaginal delivery)     Comment:  x 3 No date: Tobacco use  Reproductive/Obstetrics                             Anesthesia Physical Anesthesia Plan  ASA: II  Anesthesia Plan: General   Post-op Pain Management:    Induction: Intravenous  PONV Risk Score and Plan: 3 and Ondansetron, Dexamethasone and Midazolam  Airway Management Planned: LMA  Additional Equipment: None  Intra-op Plan:    Post-operative Plan: Extubation in OR  Informed Consent: I have reviewed the patients History and Physical, chart, labs and discussed the procedure including the risks, benefits and alternatives for the proposed anesthesia with the patient or authorized representative who has indicated his/her understanding and acceptance.     Dental advisory given  Plan Discussed with: CRNA and Surgeon  Anesthesia Plan Comments: (Discussed risks of anesthesia with patient, including PONV, sore throat, lip/dental damage. Rare risks discussed as well, such as cardiorespiratory and neurological sequelae. Patient understands.)        Anesthesia Quick Evaluation

## 2019-10-01 ENCOUNTER — Telehealth: Payer: Self-pay

## 2019-10-01 MED ORDER — OXYBUTYNIN CHLORIDE ER 10 MG PO TB24
10.0000 mg | ORAL_TABLET | Freq: Every day | ORAL | 1 refills | Status: DC
Start: 2019-10-01 — End: 2019-10-21

## 2019-10-01 MED ORDER — CEPHALEXIN 500 MG PO CAPS: 500.0000 mg | ORAL_CAPSULE | Freq: Three times a day (TID) | ORAL | 0 refills | Status: DC

## 2019-10-01 MED FILL — OXYBUTYNIN CL ER 10 MG TAB: 10 | 30 days supply | Qty: 30 | Fill #0

## 2019-10-01 MED FILL — CEPHALEXIN 500 MG CAPSULE: 500 | 5 days supply | Qty: 15 | Fill #0

## 2019-10-01 NOTE — Telephone Encounter (Signed)
Per Dr. Matilde Sprang patient it most likely experiencing bladder spasms due to surgery. She is to start oxybutinin, AZO OTC and Keflex to help with these symptoms. Patient was notified and told to call if symptoms worsen

## 2019-10-01 NOTE — Anesthesia Postprocedure Evaluation (Signed)
Anesthesia Post Note  Patient: Kayla [name]  Procedure(s) Performed: CYSTOSCOPY/HYDRODISTENSION (N/A ) CYSTOSCOPY WITH BLADDER BIOPSY (N/A )  Anesthesia Type: General     Last Vitals:  Vitals:   09/30/19 1545 09/30/19 1559  BP: 135/82 (!) 129/91  Pulse: 67 70  Resp: 13 17  Temp: (!) 36.1 C (!) 36.4 C  SpO2: 98% 100%    Last Pain:  Vitals:   10/01/19 0809  TempSrc:   PainSc: 6                  Arita Miss

## 2019-10-01 NOTE — Telephone Encounter (Signed)
Incoming call from pt who states that the [place] is not working to control her pain. Pt denies fever, chills, and nausea. Patient is supposed to return to work tomorrow. Please advise.

## 2019-10-02 ENCOUNTER — Telehealth: Payer: Self-pay

## 2019-10-02 LAB — SURGICAL PATHOLOGY

## 2019-10-02 NOTE — Telephone Encounter (Signed)
Called patient today to follow up on her symptoms from yesterday, she states she is doing much better symptoms are all but gone. She will return to work today

## 2019-10-03 LAB — URINE CULTURE: Culture: 20000 — AB

## 2019-10-21 ENCOUNTER — Ambulatory Visit (INDEPENDENT_AMBULATORY_CARE_PROVIDER_SITE_OTHER): Payer: 59 | Admitting: Urology

## 2019-10-21 ENCOUNTER — Encounter: Payer: Self-pay | Admitting: Urology

## 2019-10-21 ENCOUNTER — Other Ambulatory Visit: Payer: Self-pay

## 2019-10-21 VITALS — BP 133/90 | HR 102

## 2019-10-21 DIAGNOSIS — N3020 Other chronic cystitis without hematuria: Secondary | ICD-10-CM

## 2019-10-21 MED ORDER — MIRABEGRON ER 50 MG PO TB24
50.0000 mg | ORAL_TABLET | Freq: Every day | ORAL | 11 refills | Status: DC
Start: 2019-10-21 — End: 2022-04-18

## 2019-10-21 NOTE — Progress Notes (Signed)
10/21/2019 2:11 PM   Kayla [name] August 16, 1970 [id number]  Referring provider: [name], DO [place],  [place] [address]  Chief Complaint  Patient presents with  . Post-op Follow-up    HPI: I saw this patient July 05, 2019 in [place].  She was having suprapubic pressure with negative cultures.  I saw her a few years previous and she did not get better on trimethoprim.  History was a bit limiting when I saw her.   My index of suspicion was moderate with her flareups that she could have interstitial cystitis. Daily Macrodantin  Today Frequency stable.  She was placed on Detrol by my partner here in [place].    Cystoscopy: Patient underwent flexible cystoscopy utilizing sterile technique.  At the posterior wall of her bladder there was an erythematous area almost like a thumbprint in size.  I  Patient still has to pubic pressure.  There is 1 erythematous area in the bladder.  With a smoking history I think she should have a bladder hydrodistention and if the lesion is still present I would biopsy and fulgurate.  I do not think it is an ulcer and likely represents some cystitis.  She understands the concept of carcinoma in situ but my index of suspicion is lower.  Pros cons risk described.  Bladder perforation sequelae described.  Today When I did cystoscopy under anesthesia the erythematous area basically had normalized with some mild findings biopsy that were negative for carcinoma.  Frequency stable.  No evidence of carcinoma in situ  Patient still feels suprapubic pressure not relieved by voiding.  She says is more than a pain versus urgency.  She feels full been voids frequent small amounts  Clinically not infected today       PMH: Past Medical History:  Diagnosis Date  . Anemia   . Back pain   . Constipation   . Dyspnea   . Fatigue   . Headache   . Hypertension   . Joint pain   . Leg edema   . Obesity   . Sleep apnea    lost  weight 2013 gastric sleeve, no longer a problem  . Sleep disturbance   . SVD (spontaneous vaginal delivery)    x 3  . Tobacco use     Surgical History: Past Surgical History:  Procedure Laterality Date  . BILATERAL SALPINGECTOMY Bilateral 09/27/2013   Procedure: BILATERAL SALPINGECTOMY;  Surgeon: Lahoma Crocker, MD;  Location: [facility];  Service: Gynecology;  Laterality: Bilateral;  . CESAREAN SECTION     x 1  . CYSTO WITH HYDRODISTENSION N/A 09/30/2019   Procedure: CYSTOSCOPY/HYDRODISTENSION;  Surgeon: Bjorn Loser, MD;  Location: [facility];  Service: Urology;  Laterality: N/A;  fulgeration  . CYSTOSCOPY WITH BIOPSY N/A 09/30/2019   Procedure: CYSTOSCOPY WITH BLADDER BIOPSY;  Surgeon: Bjorn Loser, MD;  Location: [facility];  Service: Urology;  Laterality: N/A;  . HAND FUSION    . LAPAROTOMY Left 09/27/2013   Procedure: EXPLORATORY LAPAROTOMY W/ EXPLORATION OF LEFT ILLIAC ARTERY & VEIN;  Surgeon: Lahoma Crocker, MD;  Location: [facility];  Service: Gynecology;  Laterality: Left;  . ROBOTIC ASSISTED TOTAL HYSTERECTOMY N/A 09/27/2013   Procedure: ROBOTIC ASSISTED TOTAL HYSTERECTOMY;  Surgeon: Lahoma Crocker, MD;  Location: [facility];  Service: Gynecology;  Laterality: N/A;  . SLEEVE GASTROPLASTY  2013   in [place]    Home Medications:  Allergies as of 10/21/2019   No Known Allergies     Medication List  Accurate as of October 21, 2019  2:11 PM. If you have any questions, ask your nurse or doctor.        STOP taking these medications   HYDROcodone-acetaminophen 5-325 MG tablet Commonly known as: Norco Stopped by: Reece Packer, MD   oxybutynin 10 MG 24 hr tablet Commonly known as: DITROPAN-XL Stopped by: Reece Packer, MD     TAKE these medications   amLODipine 2.5 MG tablet Commonly known as: NORVASC TAKE 1 TABLET (2.5 MG TOTAL) BY MOUTH DAILY. What changed: when to take this   cephALEXin 500 MG capsule Commonly known as: Keflex Take 1 capsule (500  mg total) by mouth 3 (three) times daily.   cetirizine 10 MG tablet Commonly known as: ZYRTEC Take 1 tablet (10 mg total) by mouth daily. What changed:   when to take this  reasons to take this   fluticasone 50 MCG/ACT nasal spray Commonly known as: FLONASE PLACE 2 SPRAYS INTO BOTH NOSTRILS DAILY. What changed:   when to take this  reasons to take this   hydrochlorothiazide 25 MG tablet Commonly known as: HYDRODIURIL Take 1 tablet (25 mg total) by mouth daily. What changed: when to take this   LIDOCAINE EX Apply 1 application topically 4 (four) times daily as needed (back pain.). OTC   MENOPAUSE SUPPORT PO Take 1 tablet by mouth daily. Blossome   OVER THE COUNTER MEDICATION Take 1 capsule by mouth daily. [place]       Allergies: No Known Allergies  Family History: Family History  Problem Relation Age of Onset  . Hypertension Mother   . Colon polyps Mother   . Hyperlipidemia Mother   . Colon cancer Maternal Grandfather 51  . Colon cancer Maternal Aunt        onset in 54s  . Stomach cancer Maternal Aunt   . Leukemia Paternal Grandmother   . Colon polyps Maternal Uncle   . Colon polyps Maternal Aunt     Social History:  reports that she has been smoking cigarettes. She has a 25.00 pack-year smoking history. She uses smokeless tobacco. She reports current alcohol use. She reports that she does not use drugs.  ROS:                                        Physical Exam: BP 133/90   Pulse (!) 102   LMP 08/02/2013   Constitutional:  Alert and oriented, No acute distress.  Laboratory Data: Lab Results  Component Value Date   WBC 7.9 09/25/2019   HGB 12.4 09/25/2019   HCT 40.0 09/25/2019   MCV 90.1 09/25/2019   PLT 275 09/25/2019    Lab Results  Component Value Date   CREATININE 0.78 09/25/2019    No results found for: PSA  No results found for: TESTOSTERONE  Lab Results  Component Value Date   HGBA1C 6.5 05/07/2019      Urinalysis    Component Value Date/Time   COLORURINE AMBER (A) 02/07/2018 1005   APPEARANCEUR Cloudy (A) 09/02/2019 1017   LABSPEC 1.026 02/07/2018 1005   PHURINE 5.0 02/07/2018 1005   GLUCOSEU Negative 09/02/2019 1017   HGBUR NEGATIVE 02/07/2018 1005   BILIRUBINUR Negative 09/02/2019 1017   KETONESUR 5 (A) 02/07/2018 1005   PROTEINUR 1+ (A) 09/02/2019 1017   PROTEINUR NEGATIVE 02/07/2018 1005   UROBILINOGEN 0.2 05/07/2019 0921   UROBILINOGEN 1.0 09/29/2014 7893  NITRITE Negative 09/02/2019 1017   NITRITE NEGATIVE 02/07/2018 1005   LEUKOCYTESUR Negative 09/02/2019 1017    Pertinent Imaging:   Assessment & Plan: I will treat the patient as an overactive bladder first.  If this fails I will offer rescue treatments from a diagnostic and treatment standpoint.  I am not can start her on [place] at this stage but I may offer it in the future.  Of course would need to be on it for 6 months  There are no diagnoses linked to this encounter.  No follow-ups on file.  Reece Packer, MD  [facility] [address] [place], [place] [address] [phone]

## 2019-11-12 ENCOUNTER — Encounter: Payer: 59 | Admitting: Family Medicine

## 2019-11-15 ENCOUNTER — Other Ambulatory Visit: Payer: Self-pay | Admitting: Family Medicine

## 2019-11-15 MED FILL — HYDROCHLOROTHIAZIDE 25 MG T: 25 | 30 days supply | Qty: 30 | Fill #0

## 2019-11-15 MED FILL — AMLODIPINE 2.5 MG TABLET: 2.5 | 30 days supply | Qty: 30 | Fill #1

## 2019-12-02 ENCOUNTER — Ambulatory Visit: Payer: Self-pay | Admitting: Urology

## 2019-12-03 ENCOUNTER — Other Ambulatory Visit: Payer: Self-pay | Admitting: Family Medicine

## 2019-12-03 ENCOUNTER — Other Ambulatory Visit: Payer: Self-pay

## 2019-12-03 ENCOUNTER — Ambulatory Visit (INDEPENDENT_AMBULATORY_CARE_PROVIDER_SITE_OTHER): Payer: 59 | Admitting: Family Medicine

## 2019-12-03 ENCOUNTER — Encounter: Payer: Self-pay | Admitting: Family Medicine

## 2019-12-03 VITALS — BP 132/80 | HR 86 | Temp 97.5°F | Resp 16 | Ht 66.0 in | Wt 251.5 lb

## 2019-12-03 DIAGNOSIS — I10 Essential (primary) hypertension: Secondary | ICD-10-CM | POA: Diagnosis not present

## 2019-12-03 DIAGNOSIS — E119 Type 2 diabetes mellitus without complications: Secondary | ICD-10-CM

## 2019-12-03 DIAGNOSIS — E1169 Type 2 diabetes mellitus with other specified complication: Secondary | ICD-10-CM | POA: Insufficient documentation

## 2019-12-03 DIAGNOSIS — F17200 Nicotine dependence, unspecified, uncomplicated: Secondary | ICD-10-CM

## 2019-12-03 DIAGNOSIS — E559 Vitamin D deficiency, unspecified: Secondary | ICD-10-CM

## 2019-12-03 DIAGNOSIS — L309 Dermatitis, unspecified: Secondary | ICD-10-CM

## 2019-12-03 LAB — POCT GLYCOSYLATED HEMOGLOBIN (HGB A1C): Hemoglobin A1C: 5.6 % (ref 4.0–5.6)

## 2019-12-03 MED ORDER — AMLODIPINE BESYLATE 2.5 MG PO TABS: 2.5000 mg | ORAL_TABLET | Freq: Every day | ORAL | 2 refills | Status: DC

## 2019-12-03 MED ORDER — CHANTIX STARTING MONTH PAK 0.5 MG X 11 & 1 MG X 42 PO TABS: ORAL_TABLET | ORAL | 0 refills | Status: DC

## 2019-12-03 MED ORDER — TRIAMCINOLONE ACETONIDE 0.1 % EX CREA: 1.0000 "application " | TOPICAL_CREAM | Freq: Every day | CUTANEOUS | 3 refills | Status: DC | PRN

## 2019-12-03 MED ORDER — HYDROCHLOROTHIAZIDE 25 MG PO TABS: 25.0000 mg | ORAL_TABLET | Freq: Every day | ORAL | 2 refills | Status: DC

## 2019-12-03 MED ORDER — SAXENDA 18 MG/3ML ~~LOC~~ SOPN: 3.0000 mg | PEN_INJECTOR | Freq: Every day | SUBCUTANEOUS | 1 refills | Status: DC

## 2019-12-03 MED FILL — TRIAMCINOLONE ACETONIDE 0.1: 0.1 | 10 days supply | Qty: 30 | Fill #0

## 2019-12-03 MED FILL — CHANTIX STARTING MONTH BOX: 0.5 MG X 11 | 28 days supply | Qty: 53 | Fill #0

## 2019-12-03 NOTE — Assessment & Plan Note (Signed)
BP adequately controlled. Continue amlodipine 2.5 mg daily and HCTZ 25 mg daily. Low-salt diet. Weight loss will help.

## 2019-12-03 NOTE — Assessment & Plan Note (Addendum)
Further recommendation will be given according to 25 OH vitamin D result. 

## 2019-12-03 NOTE — Progress Notes (Signed)
HPI: Kayla [name] is a 49 y.o. female, who is here today to establish care.  Former PCP: Dr Maudie Mercury. Last preventive routine visit: 09/2017.  Chronic medical problems: DM II,HTN,obesity,tobacco use disorder,and fatigue among some.  Concerns today: Medication refills and weight gain. -DM2 Dx'ed in 2009 and in remission after bariatric surgery in 2011. Negative for polydipsia,polyuria, or polyphagia.  For the past 3 years she has steadily gained wt. She is not exercising regularly nor following a healthful diet. She works second shift. She drinks coffee with creamer and sugar around 10 AM. Around 7 30-8 p.m. she is "large" while at work, small amount. She gets home about 11-12 midnight, after taking a shower she goes to bed with, falls asleep while eating and continue doing so intermittently throughout the night. Her husband keeps snacks on her night table.  -HTN: Dx'ed around 2008-2009. Currently she is on amlodipine 2.5 mg daily and HCTZ 25 mg daily.  Negative for severe/frequent headache, visual changes, chest pain, dyspnea, palpitation, claudication, focal weakness, or edema.  Lab Results  Component Value Date   CREATININE 0.78 09/25/2019   BUN 17 09/25/2019   NA 141 09/25/2019   K 3.8 09/25/2019   CL 107 09/25/2019   CO2 26 09/25/2019   Lab Results  Component Value Date   CHOL 161 12/12/2017   HDL 53 12/12/2017   LDLCALC 88 12/12/2017   TRIG 98 12/12/2017   CHOLHDL 2 12/04/2015   -Tobacco use disorder: In the past she has tried Chantix and it helped for a while.  -Vitamin D deficiency: She is not on vit D supplementation.  -Erythematous and scaly rash on upper extremities, intermittently for a while. He seems to be exacerbated by a stress. Rash is very pruritic. OTC moisturizer has helped some. Negative for fever, chills, or body aches.  Review of Systems  Constitutional: Positive for fatigue. Negative for activity change, appetite change and fever.    HENT: Negative for mouth sores, nosebleeds and trouble swallowing.   Respiratory: Negative for cough and wheezing.   Gastrointestinal: Negative for abdominal pain, nausea and vomiting.       Negative for changes in bowel habits.  Genitourinary: Negative for decreased urine volume and hematuria.  Musculoskeletal: Negative for gait problem and myalgias.  Neurological: Negative for syncope and facial asymmetry.  Rest see pertinent positives and negatives per HPI.  Current Outpatient Medications on File Prior to Visit  Medication Sig Dispense Refill  . cetirizine (ZYRTEC) 10 MG tablet Take 1 tablet (10 mg total) by mouth daily. (Patient taking differently: Take 10 mg by mouth daily as needed for allergies. ) 30 tablet 11  . fluticasone (FLONASE) 50 MCG/ACT nasal spray PLACE 2 SPRAYS INTO BOTH NOSTRILS DAILY. (Patient taking differently: Place 2 sprays into both nostrils daily as needed (allergies.). ) 16 g 3  . LIDOCAINE EX Apply 1 application topically 4 (four) times daily as needed (back pain.). OTC    . mirabegron ER (MYRBETRIQ) 50 MG TB24 tablet Take 1 tablet (50 mg total) by mouth daily. 30 tablet 11  . OVER THE COUNTER MEDICATION Take 1 capsule by mouth daily. [employer]    . Specialty Vitamins Products (MENOPAUSE SUPPORT PO) Take 1 tablet by mouth daily. Blossome     No current facility-administered medications on file prior to visit.   Past Medical History:  Diagnosis Date  . Anemia   . Back pain   . Constipation   . Dyspnea   . Fatigue   .  Headache   . Hypertension   . Joint pain   . Leg edema   . Obesity   . Sleep apnea    lost weight 2013 gastric sleeve, no longer a problem  . Sleep disturbance   . SVD (spontaneous vaginal delivery)    x 3  . Tobacco use    No Known Allergies  Family History  Problem Relation Age of Onset  . Hypertension Mother   . Colon polyps Mother   . Hyperlipidemia Mother   . Colon cancer Maternal Grandfather 18  . Colon cancer Maternal Aunt         onset in 53s  . Stomach cancer Maternal Aunt   . Leukemia Paternal Grandmother   . Colon polyps Maternal Uncle   . Colon polyps Maternal Aunt    Social History   Socioeconomic History  . Marital status: Married    Spouse name: Kinnie Scales  . Number of children: 4  . Years of education: Not on file  . Highest education level: Not on file  Occupational History  . Not on file  Tobacco Use  . Smoking status: Current Every Day Smoker    Packs/day: 1.00    Years: 25.00    Pack years: 25.00    Types: Cigarettes  . Smokeless tobacco: Current User  Vaping Use  . Vaping Use: Never used  Substance and Sexual Activity  . Alcohol use: Yes    Alcohol/week: 0.0 standard drinks    Comment: socially  . Drug use: No  . Sexual activity: Yes    Partners: Male    Birth control/protection: None  Other Topics Concern  . Not on file  Social History Narrative   Work or School: Producer, television/film/video, Catering manager      Home Situation: lives with husband and 48 yo sone in 2016      Spiritual Beliefs: Christian      Lifestyle: no regular exercise, poor diet      Social Determinants of Health   Financial Resource Strain:   . Difficulty of Paying Living Expenses:   Food Insecurity:   . Worried About Charity fundraiser in the Last Year:   . Arboriculturist in the Last Year:   Transportation Needs:   . Film/video editor (Medical):   Marland Kitchen Lack of Transportation (Non-Medical):   Physical Activity:   . Days of Exercise per Week:   . Minutes of Exercise per Session:   Stress:   . Feeling of Stress :   Social Connections:   . Frequency of Communication with Friends and Family:   . Frequency of Social Gatherings with Friends and Family:   . Attends Religious Services:   . Active Member of Clubs or Organizations:   . Attends Archivist Meetings:   Marland Kitchen Marital Status:    Vitals:   12/03/19 1138  BP: (!) 132/80  Pulse: 86  Resp: 16  Temp: (!) 97.5 F (36.4 C)  SpO2: 99%    Body mass index is 40.59 kg/m.  Physical Exam Vitals and nursing note reviewed.  Constitutional:      General: She is not in acute distress.    Appearance: She is well-developed.  HENT:     Head: Normocephalic and atraumatic.     Mouth/Throat:     Mouth: Mucous membranes are moist.     Pharynx: Oropharynx is clear.  Eyes:     Conjunctiva/sclera: Conjunctivae normal.     Pupils: Pupils are equal,  round, and reactive to light.  Cardiovascular:     Rate and Rhythm: Normal rate and regular rhythm.     Pulses:          Dorsalis pedis pulses are 2+ on the right side and 2+ on the left side.     Heart sounds: No murmur heard.   Pulmonary:     Effort: Pulmonary effort is normal. No respiratory distress.     Breath sounds: Normal breath sounds.  Abdominal:     Palpations: Abdomen is soft. There is no hepatomegaly or mass.     Tenderness: There is no abdominal tenderness.  Lymphadenopathy:     Cervical: No cervical adenopathy.  Skin:    General: Skin is warm.     Findings: Rash present. No erythema.     Comments: Mildly erythematous,post inflammatory pigmentation changes, and not tender scaly areas on UE's, mainly anterior aspect of elbows and forearms. Some on dorsum of finger.  Neurological:     Mental Status: She is alert and oriented to person, place, and time.     Cranial Nerves: No cranial nerve deficit.     Gait: Gait normal.  Psychiatric:     Comments: Well groomed, good eye contact.    Diabetic Foot Exam - Simple   Simple Foot Form Diabetic Foot exam was performed with the following findings: Yes 12/03/2019 12:32 PM  Visual Inspection See comments: Yes Sensation Testing Intact to touch and monofilament testing bilaterally: Yes Pulse Check Posterior Tibialis and Dorsalis pulse intact bilaterally: Yes Comments Bunions R>L.  Flat feet.    ASSESSMENT AND PLAN:  Ms. Modelle was seen today for establish care.  Diagnoses and all orders for this visit: Orders  Placed This Encounter  Procedures  . Comprehensive metabolic panel  . Lipid panel  . VITAMIN D 25 Hydroxy (Vit-D Deficiency, Fractures)  . POC HgB A1c   Lab Results  Component Value Date   CREATININE 0.77 12/03/2019   BUN 19 12/03/2019   NA 140 12/03/2019   K 4.3 12/03/2019   CL 104 12/03/2019   CO2 25 12/03/2019   Lab Results  Component Value Date   ALT 13 12/03/2019   AST 15 12/03/2019   ALKPHOS 55 12/12/2017   BILITOT 0.4 12/03/2019   Lab Results  Component Value Date   CHOL 153 12/03/2019   HDL 47 (L) 12/03/2019   LDLCALC 88 12/03/2019   TRIG 90 12/03/2019   CHOLHDL 3.3 12/03/2019   Lab Results  Component Value Date   HGBA1C 5.6 12/03/2019   The 10-year ASCVD risk score Mikey Bussing DC Jr., et al., 2013) is: 17.8%   Values used to calculate the score:     Age: 80 years     Sex: Female     Is Non-Hispanic African American: Yes     Diabetic: Yes     Tobacco smoker: Yes     Systolic Blood Pressure: 765 mmHg     Is BP treated: Yes     HDL Cholesterol: 47 mg/dL     Total Cholesterol: 153 mg/dL  Type 2 diabetes mellitus in remission ([place]) HgA1C in normal range.  Regular exercise and healthy diet with avoidance of added sugar food intake is an important part of treatment and recommended. Annual eye exam, periodic dental and foot care recommended.   Vitamin D deficiency Further recommendation will be given according to 25 OH vitamin D result.  Tobacco use disorder After discussion of adverse effects of tobacco and benefits of smoking  cessation, she would like to try Chantix again. Some side effect discussed.  Essential hypertension BP adequately controlled. Continue amlodipine 2.5 mg daily and HCTZ 25 mg daily. Low-salt diet. Weight loss will help.  Morbid obesity ([place]) We discussed benefits of wt loss as well as adverse effects of obesity. Consistency with healthy diet and physical activity recommended. She will ask husband to walk with her in the morning  when she wakes up. Recommend stopping eating in bed or keeping snacks in her room. Drinking a protein shake right after work may help.  She also would like to try pharmacologic treatment, Saxenda 3 mg daily recommended.   Eczema Small amount of topical steroid (triamcinolone cream) on affected area recommended. We discussed side effects of chronic use. Avoid scratching skin.   40 min face to face OV. > 50% was dedicated to discussion of Dx, prognosis, treatment options, and some side effects of medications. Reviewed some of her dietary habits,healthier options recommended and well as treatment options.   Return in about 4 months (around 04/04/2020).   Oran Dillenburg G. Martinique, MD  [facility]. [place] office.   A few things to remember from today's visit:  Saxenda daily for wt loss. Chantix for smoking cessation.   Avoid eating in bed. Eat or drink protein on your way home. NO FOOD AROUND BED.   If you need refills please call your pharmacy. Do not use My Chart to request refills or for acute issues that need immediate attention.    Please be sure medication list is accurate. If a new problem present, please set up appointment sooner than planned today.

## 2019-12-03 NOTE — Assessment & Plan Note (Signed)
Small amount of topical steroid (triamcinolone cream) on affected area recommended. We discussed side effects of chronic use. Avoid scratching skin.

## 2019-12-03 NOTE — Assessment & Plan Note (Addendum)
HgA1C in normal range.  Regular exercise and healthy diet with avoidance of added sugar food intake recommended. Annual eye exam and foot care recommended.

## 2019-12-03 NOTE — Assessment & Plan Note (Addendum)
We discussed benefits of wt loss as well as adverse effects of obesity. Consistency with healthy diet and physical activity recommended. She will ask husband to walk with her in the morning when she wakes up. Recommend stopping eating in bed or keeping snacks in her room. Drinking a protein shake right after work may help.  She also would like to try pharmacologic treatment, Saxenda 3 mg daily recommended.

## 2019-12-03 NOTE — Patient Instructions (Addendum)
A few things to remember from today's visit:  Saxenda daily for wt loss. Chantix for smoking cessation.   Avoid eating in bed. Eat or drink protein on your way home. NO FOOD AROUND BED.   If you need refills please call your pharmacy. Do not use My Chart to request refills or for acute issues that need immediate attention.    Please be sure medication list is accurate. If a new problem present, please set up appointment sooner than planned today.

## 2019-12-03 NOTE — Assessment & Plan Note (Signed)
After discussion of adverse effects of tobacco and benefits of smoking cessation, she would like to try Chantix again. Some side effect discussed.

## 2019-12-04 LAB — COMPREHENSIVE METABOLIC PANEL
AG Ratio: 1.4 (calc) (ref 1.0–2.5)
ALT: 13 U/L (ref 6–29)
AST: 15 U/L (ref 10–35)
Albumin: 4.2 g/dL (ref 3.6–5.1)
Alkaline phosphatase (APISO): 53 U/L (ref 37–153)
BUN: 19 mg/dL (ref 7–25)
CO2: 25 mmol/L (ref 20–32)
Calcium: 9.2 mg/dL (ref 8.6–10.4)
Chloride: 104 mmol/L (ref 98–110)
Creat: 0.77 mg/dL (ref 0.50–1.05)
Globulin: 2.9 g/dL (calc) (ref 1.9–3.7)
Glucose, Bld: 87 mg/dL (ref 65–99)
Potassium: 4.3 mmol/L (ref 3.5–5.3)
Sodium: 140 mmol/L (ref 135–146)
Total Bilirubin: 0.4 mg/dL (ref 0.2–1.2)
Total Protein: 7.1 g/dL (ref 6.1–8.1)

## 2019-12-04 LAB — LIPID PANEL
Cholesterol: 153 mg/dL (ref <200)
HDL: 47 mg/dL — ABNORMAL LOW (ref >=50)
LDL Cholesterol (Calc): 88 mg/dL (calc)
Non-HDL Cholesterol (Calc): 106 mg/dL (calc) (ref <130)
Total CHOL/HDL Ratio: 3.3 (calc) (ref <5.0)
Triglycerides: 90 mg/dL (ref <150)

## 2019-12-04 LAB — VITAMIN D 25 HYDROXY (VIT D DEFICIENCY, FRACTURES): Vit D, 25-Hydroxy: 22 ng/mL — ABNORMAL LOW (ref 30–100)

## 2019-12-09 MED FILL — AMLODIPINE 2.5 MG TABLET: 2.5 | 90 days supply | Qty: 90 | Fill #0

## 2019-12-09 MED FILL — HYDROCHLOROTHIAZIDE 25 MG T: 25 | 90 days supply | Qty: 90 | Fill #0

## 2019-12-11 ENCOUNTER — Other Ambulatory Visit: Payer: Self-pay

## 2019-12-11 MED ORDER — PRAVASTATIN SODIUM 10 MG PO TABS: 10.0000 mg | ORAL_TABLET | Freq: Every day | ORAL | 6 refills | Status: DC

## 2019-12-11 MED FILL — SAXENDA 18 MG/3 ML PEN: 18 | 30 days supply | Qty: 15 | Fill #0

## 2019-12-11 MED FILL — UNIFINE PENTIPS 6MM 31G: 31G X 6 MM | 90 days supply | Qty: 100 | Fill #0

## 2019-12-11 MED FILL — PRAVASTATIN NA 10 MG TAB: 10 | 30 days supply | Qty: 30 | Fill #0

## 2019-12-16 ENCOUNTER — Other Ambulatory Visit: Payer: Self-pay

## 2019-12-16 ENCOUNTER — Ambulatory Visit (INDEPENDENT_AMBULATORY_CARE_PROVIDER_SITE_OTHER): Payer: 59 | Admitting: Urology

## 2019-12-16 ENCOUNTER — Encounter: Payer: Self-pay | Admitting: Urology

## 2019-12-16 VITALS — BP 138/99 | HR 91 | Ht 67.0 in | Wt 254.0 lb

## 2019-12-16 DIAGNOSIS — N3941 Urge incontinence: Secondary | ICD-10-CM | POA: Diagnosis not present

## 2019-12-16 NOTE — Progress Notes (Signed)
12/16/2019 8:56 AM   Kayla [name] [birth year] [id number]  Referring provider: No referring provider defined for this encounter.  Chief Complaint  Patient presents with   Cystitis    6wk follow up    HPI: I saw this patient July 05, 2019 in [place]. She was having suprapubic pressure with negative cultures. I saw her a few years previous and she did not get better on trimethoprim. History was a bit limiting when I saw her. My index of suspicion was moderate with her flareups that she could have interstitial cystitis. Daily Macrodantin  She was placed on Detrol by my partner here in [place].   Cystoscopy: Patient underwent flexible cystoscopy utilizing sterile technique. At the posterior wall of her bladder there was an erythematous area almost like a thumbprint in size. I  Patient still has to pubic pressure. There is 1 erythematous area in the bladder. With a smoking history I think she should have a bladder hydrodistention and if the lesion is still present I would biopsy and fulgurate. I do not think it is an ulcer and likely represents some cystitis. She understands the concept of carcinoma in situ but my index of suspicion is lower. Pros cons risk described. Bladder perforation sequelae described.  When I did cystoscopy under anesthesia the erythematous area basically had normalized with some mild findings biopsy that were negative for carcinoma.  Frequency stable.  No evidence of carcinoma in situ  Patient still feels suprapubic pressure not relieved by voiding.  She says is more than a pain versus urgency.  She feels full been voids frequent small amounts  Clinically not infected today    I will treat the patient as an overactive bladder first.  If this fails I will offer rescue treatments from a diagnostic and treatment standpoint.  I am not can start her on [place]miron at this stage but I may offer it in the future.  Of course would need to be on  it for 6 months  Today Pressure feeling completely gone.  Frequency dramatically improved.  Volume voided much better.  Clinically not infected and very pleased    PMH: Past Medical History:  Diagnosis Date   Anemia    Back pain    Constipation    Dyspnea    Fatigue    Headache    Hypertension    Joint pain    Leg edema    Obesity    Sleep apnea    lost weight 2013 gastric sleeve, no longer a problem   Sleep disturbance    SVD (spontaneous vaginal delivery)    x 3   Tobacco use     Surgical History: Past Surgical History:  Procedure Laterality Date   BILATERAL SALPINGECTOMY Bilateral 09/27/2013   Procedure: BILATERAL SALPINGECTOMY;  Surgeon: Lahoma Crocker, MD;  Location: [facility];  Service: Gynecology;  Laterality: Bilateral;   CESAREAN SECTION     x 1   CYSTO WITH HYDRODISTENSION N/A 09/30/2019   Procedure: CYSTOSCOPY/HYDRODISTENSION;  Surgeon: Bjorn Loser, MD;  Location: [facility];  Service: Urology;  Laterality: N/A;  fulgeration   CYSTOSCOPY WITH BIOPSY N/A 09/30/2019   Procedure: CYSTOSCOPY WITH BLADDER BIOPSY;  Surgeon: Bjorn Loser, MD;  Location: [facility];  Service: Urology;  Laterality: N/A;   HAND FUSION     LAPAROTOMY Left 09/27/2013   Procedure: EXPLORATORY LAPAROTOMY W/ EXPLORATION OF LEFT ILLIAC ARTERY & VEIN;  Surgeon: Lahoma Crocker, MD;  Location: [facility];  Service: Gynecology;  Laterality: Left;  ROBOTIC ASSISTED TOTAL HYSTERECTOMY N/A 09/27/2013   Procedure: ROBOTIC ASSISTED TOTAL HYSTERECTOMY;  Surgeon: Lahoma Crocker, MD;  Location: [facility];  Service: Gynecology;  Laterality: N/A;   SLEEVE GASTROPLASTY  2013   in [place]    Home Medications:  Allergies as of 12/16/2019   No Known Allergies     Medication List       Accurate as of December 16, 2019  8:56 AM. If you have any questions, ask your nurse or doctor.        amLODipine 2.5 MG tablet Commonly known as: NORVASC Take 1 tablet (2.5 mg total) by mouth  daily at 2 PM.   cetirizine 10 MG tablet Commonly known as: ZYRTEC Take 1 tablet (10 mg total) by mouth daily. What changed:   when to take this  reasons to take this   Chantix Starting Month Pak 0.5 MG X 11 & 1 MG X 42 tablet Generic drug: varenicline Take one 0.5 mg tablet by mouth once daily for 3 days, then increase to one 0.5 mg tablet twice daily for 4 days, then increase to one 1 mg tablet twice daily.   fluticasone 50 MCG/ACT nasal spray Commonly known as: FLONASE PLACE 2 SPRAYS INTO BOTH NOSTRILS DAILY. What changed:   when to take this  reasons to take this   hydrochlorothiazide 25 MG tablet Commonly known as: HYDRODIURIL Take 1 tablet (25 mg total) by mouth daily.   LIDOCAINE EX Apply 1 application topically 4 (four) times daily as needed (back pain.). OTC   MENOPAUSE SUPPORT PO Take 1 tablet by mouth daily. Blossome   mirabegron ER 50 MG Tb24 tablet Commonly known as: MYRBETRIQ Take 1 tablet (50 mg total) by mouth daily.   OVER THE COUNTER MEDICATION Take 1 capsule by mouth daily. [place]   pravastatin 10 MG tablet Commonly known as: PRAVACHOL Take 1 tablet (10 mg total) by mouth daily.   Saxenda 18 MG/3ML Sopn Generic drug: Liraglutide -Weight Management Inject 0.5 mLs (3 mg total) into the skin daily.   triamcinolone cream 0.1 % Commonly known as: KENALOG Apply 1 application topically daily as needed. On affected areas of upper extremities.       Allergies: No Known Allergies  Family History: Family History  Problem Relation Age of Onset   Hypertension Mother    Colon polyps Mother    Hyperlipidemia Mother    Colon cancer Maternal Grandfather 64   Colon cancer Maternal Aunt        onset in 43s   Stomach cancer Maternal Aunt    Leukemia Paternal Grandmother    Colon polyps Maternal Uncle    Colon polyps Maternal Aunt     Social History:  reports that she has been smoking cigarettes. She has a 25.00 pack-year smoking  history. She uses smokeless tobacco. She reports current alcohol use. She reports that she does not use drugs.  ROS:                                        Physical Exam: BP (!) 138/99    Pulse 91    Ht 5\' 7"  (1.702 m)    Wt 115.2 kg    LMP 08/02/2013    BMI 39.78 kg/m   Constitutional:  Alert and oriented, No acute distress.  Laboratory Data: Lab Results  Component Value Date   WBC 7.9 09/25/2019  HGB 12.4 09/25/2019   HCT 40.0 09/25/2019   MCV 90.1 09/25/2019   PLT 275 09/25/2019    Lab Results  Component Value Date   CREATININE 0.77 12/03/2019    No results found for: PSA  No results found for: TESTOSTERONE  Lab Results  Component Value Date   HGBA1C 5.6 12/03/2019    Urinalysis    Component Value Date/Time   COLORURINE AMBER (A) 02/07/2018 1005   APPEARANCEUR Cloudy (A) 09/02/2019 1017   LABSPEC 1.026 02/07/2018 1005   PHURINE 5.0 02/07/2018 1005   GLUCOSEU Negative 09/02/2019 1017   HGBUR NEGATIVE 02/07/2018 1005   BILIRUBINUR Negative 09/02/2019 1017   KETONESUR 5 (A) 02/07/2018 1005   PROTEINUR 1+ (A) 09/02/2019 1017   PROTEINUR NEGATIVE 02/07/2018 1005   UROBILINOGEN 0.2 05/07/2019 0921   UROBILINOGEN 1.0 09/29/2014 0838   NITRITE Negative 09/02/2019 1017   NITRITE NEGATIVE 02/07/2018 1005   LEUKOCYTESUR Negative 09/02/2019 1017    Pertinent Imaging:   Assessment & Plan: Reassess durability in 4 months and then once a year  There are no diagnoses linked to this encounter.  No follow-ups on file.  Reece Packer, MD  [facility] [address] [place], [place] [address] [phone]

## 2020-01-04 MED FILL — SAXENDA 18 MG/3 ML PEN: 18 | 30 days supply | Qty: 15 | Fill #1

## 2020-02-17 ENCOUNTER — Other Ambulatory Visit: Payer: Self-pay | Admitting: Family Medicine

## 2020-02-17 ENCOUNTER — Telehealth: Payer: Self-pay | Admitting: Family Medicine

## 2020-02-17 MED FILL — SAXENDA 18 MG/3 ML PEN: 18 | 30 days supply | Qty: 15 | Fill #0

## 2020-02-17 NOTE — Telephone Encounter (Signed)
Patient needs a refill Saxenda.  Please call pt and leave a message when the medication is called in.   Pharmacy- [facility]

## 2020-02-17 NOTE — Telephone Encounter (Signed)
Patient is aware Rx has been sent in.

## 2020-03-30 ENCOUNTER — Telehealth: Payer: Self-pay | Admitting: Family Medicine

## 2020-03-30 ENCOUNTER — Other Ambulatory Visit: Payer: Self-pay | Admitting: Family Medicine

## 2020-03-30 DIAGNOSIS — Z1231 Encounter for screening mammogram for malignant neoplasm of breast: Secondary | ICD-10-CM

## 2020-03-30 NOTE — Telephone Encounter (Signed)
Patient is calling and wanted to see if Dr. Martinique could put in an referral to get a mammogram. Patient stated that per office order needs to say diagnostic screening, please advise. CB is [phone]

## 2020-03-30 NOTE — Telephone Encounter (Signed)
Order is placed, they will call patient to schedule.

## 2020-04-06 ENCOUNTER — Other Ambulatory Visit: Payer: Self-pay | Admitting: Family Medicine

## 2020-04-06 ENCOUNTER — Encounter: Payer: Self-pay | Admitting: Family Medicine

## 2020-04-06 ENCOUNTER — Telehealth: Payer: Self-pay

## 2020-04-06 ENCOUNTER — Ambulatory Visit (INDEPENDENT_AMBULATORY_CARE_PROVIDER_SITE_OTHER): Payer: 59

## 2020-04-06 ENCOUNTER — Other Ambulatory Visit: Payer: Self-pay

## 2020-04-06 ENCOUNTER — Ambulatory Visit (INDEPENDENT_AMBULATORY_CARE_PROVIDER_SITE_OTHER): Payer: 59 | Admitting: Family Medicine

## 2020-04-06 VITALS — BP 134/88 | HR 93 | Temp 97.6°F | Resp 16 | Ht 67.0 in | Wt 233.6 lb

## 2020-04-06 DIAGNOSIS — M545 Low back pain, unspecified: Secondary | ICD-10-CM

## 2020-04-06 DIAGNOSIS — N644 Mastodynia: Secondary | ICD-10-CM

## 2020-04-06 DIAGNOSIS — N6310 Unspecified lump in the right breast, unspecified quadrant: Secondary | ICD-10-CM

## 2020-04-06 DIAGNOSIS — M7989 Other specified soft tissue disorders: Secondary | ICD-10-CM | POA: Diagnosis not present

## 2020-04-06 DIAGNOSIS — Z1159 Encounter for screening for other viral diseases: Secondary | ICD-10-CM | POA: Diagnosis not present

## 2020-04-06 DIAGNOSIS — G8929 Other chronic pain: Secondary | ICD-10-CM

## 2020-04-06 DIAGNOSIS — I10 Essential (primary) hypertension: Secondary | ICD-10-CM

## 2020-04-06 MED ORDER — KETOROLAC TROMETHAMINE 60 MG/2ML IM SOLN
60.0000 mg | Freq: Once | INTRAMUSCULAR | Status: AC
  Administered 2020-04-06: 60 mg via INTRAMUSCULAR

## 2020-04-06 NOTE — Telephone Encounter (Signed)
Patient is needing to have in the description for her mammogram which breast it is and that she has a lump in her breast. She is needing a diagnostic mammogram as well    Please call and advise

## 2020-04-06 NOTE — Patient Instructions (Addendum)
A few things to remember from today's visit:   Breast tenderness in female - Plan: TSH, Prolactin, CBC with Differential/Platelet  Mass of soft tissue of chest - Plan: Ambulatory referral to General Surgery  Chronic midline low back pain without sciatica - Plan: DG Lumbar Spine Complete, BASIC METABOLIC PANEL WITH GFR, Ambulatory referral to Physical Therapy  Encounter for HCV screening test for low risk patient - Plan: Hepatitis C antibody  If you need refills please call your pharmacy. Do not use My Chart to request refills or for acute issues that need immediate attention.   Local heat and icy hot patch may help. PT will be arranged. Resume Saxenda. Pending diagnostic mammogram. Monitor blood pressure at home. Aleve 220 mg 2 times daily for 7 days may also help. Tylenol 500 mg 3-4 times per day.  Please be sure medication list is accurate. If a new problem present, please set up appointment sooner than planned today.

## 2020-04-06 NOTE — Progress Notes (Signed)
Chief Complaint  Patient presents with  . knot on back   HPI: [name] is a 49 y.o. female, who is here today with above complaint and other concerns. She has had back pain intermittently for a while. Her husband noted "something" in her back about 3 weeks ago, "knot."She cannot feel it all the time, today she did not find it today when taking a shower.She is not sure for how long she has had lesion, not sure about growth.She has not noted skin changes.  Mid lower back pain intermittently for the past 3 weeks. Exacerbated by certain activities like lifting and bending. It seems to be getting worse. Sharp pain, max 9/10. Pain is interfering with sleep. Pain is not radiated. She has not noted saddle anesthesia or bladder/bowel dysfunction.  Morning stiffness and achy like pain.  She has been on Ibuprofen but it is not longer working. She has not taken analgesics today.  Breast tenderness/full sensation. Problem has been going on for a week or so. Pending Dx mammogram. Negative for nipple discharge. Concerned about "a vein popped out" under left breast, "thick." Negative for breast masses,skin rash,or nipple discharge. She has not had trauma.  She has a "lump" in between breasts,chest wall, for years. It seems to be growing. Thre is no pain or pruritus. Negative for fever,night sweats,wt loss,cough,wheezing,or SOB.  HTN: She is on HCTZ 25 mg daily and Amlodipine 2.5 mg daily.  Last follow up on 12/03/19. She has not noted unusual/severe headache,visual changes,focal weakness,or edema.  Component     Latest Ref Rng & Units 12/03/2019  Sodium     135 - 146 mmol/L 140  Potassium     3.5 - 5.3 mmol/L 4.3  Chloride     98 - 110 mmol/L 104  CO2     20 - 32 mmol/L 25  Glucose     65 - 99 mg/dL 87  BUN     7 - 25 mg/dL 19  Creatinine     0.50 - 1.05 mg/dL 0.77  Calcium     8.6 - 10.4 mg/dL 9.2  GFR, Est Non African American     > OR = 60 mL/min/1.86m2    GFR, Est African American     > OR = 60 mL/min/1.61m2   Anion gap     5 - 15    Obesity: Last visit Saxenda 3 mg daily was recommended. She is not exercising regularly. Medication helped with portion controlled. She has lost wt, she has not been compliant with taking medication, so she has gained some wt back.  No side effects reported. DM II, well controlled.  Lab Results  Component Value Date   HGBA1C 5.6 12/03/2019   Review of Systems  Constitutional: Negative for activity change, appetite change and fatigue.  HENT: Negative for mouth sores, nosebleeds and sore throat.   Gastrointestinal: Negative for abdominal pain, nausea and vomiting.       Negative for changes in bowel habits.  Genitourinary: Negative for decreased urine volume, dysuria and hematuria.  Musculoskeletal: Negative for gait problem.  Skin: Negative for pallor and rash.  Neurological: Negative for syncope, facial asymmetry and weakness.  Psychiatric/Behavioral: Positive for sleep disturbance. Negative for confusion.  Rest see pertinent positives and negatives per HPI.  Current Outpatient Medications on File Prior to Visit  Medication Sig Dispense Refill  . amLODipine (NORVASC) 2.5 MG tablet Take 1 tablet (2.5 mg total) by mouth daily at 2 PM. 90 tablet  2  . cetirizine (ZYRTEC) 10 MG tablet Take 1 tablet (10 mg total) by mouth daily. (Patient taking differently: Take 10 mg by mouth daily as needed for allergies. ) 30 tablet 11  . fluticasone (FLONASE) 50 MCG/ACT nasal spray PLACE 2 SPRAYS INTO BOTH NOSTRILS DAILY. (Patient taking differently: Place 2 sprays into both nostrils daily as needed (allergies.). ) 16 g 3  . hydrochlorothiazide (HYDRODIURIL) 25 MG tablet Take 1 tablet (25 mg total) by mouth daily. 90 tablet 2  . LIDOCAINE EX Apply 1 application topically 4 (four) times daily as needed (back pain.). OTC    . mirabegron ER (MYRBETRIQ) 50 MG TB24 tablet Take 1 tablet (50 mg total) by mouth daily. 30 tablet  11  . OVER THE COUNTER MEDICATION Take 1 capsule by mouth daily. [employer]    . pravastatin (PRAVACHOL) 10 MG tablet Take 1 tablet (10 mg total) by mouth daily. 30 tablet 6  . SAXENDA 18 MG/3ML SOPN INJECT 0.5 MLS (3 MG) INTO THE SKIN DAILY 15 mL 1  . Specialty Vitamins Products (MENOPAUSE SUPPORT PO) Take 1 tablet by mouth daily. [employer]    . triamcinolone cream (KENALOG) 0.1 % Apply 1 application topically daily as needed. On affected areas of upper extremities. 30 g 3  . varenicline (CHANTIX STARTING MONTH PAK) 0.5 MG X 11 & 1 MG X 42 tablet Take one 0.5 mg tablet by mouth once daily for 3 days, then increase to one 0.5 mg tablet twice daily for 4 days, then increase to one 1 mg tablet twice daily. 53 tablet 0   No current facility-administered medications on file prior to visit.   Past Medical History:  Diagnosis Date  . Anemia   . Back pain   . Constipation   . Dyspnea   . Fatigue   . Headache   . Hypertension   . Joint pain   . Leg edema   . Obesity   . Sleep apnea    lost weight 2013 gastric sleeve, no longer a problem  . Sleep disturbance   . SVD (spontaneous vaginal delivery)    x 3  . Tobacco use    No Known Allergies  Social History   Socioeconomic History  . Marital status: Married    Spouse name: Kinnie Scales  . Number of children: 4  . Years of education: Not on file  . Highest education level: Not on file  Occupational History  . Not on file  Tobacco Use  . Smoking status: Current Every Day Smoker    Packs/day: 1.00    Years: 25.00    Pack years: 25.00    Types: Cigarettes  . Smokeless tobacco: Current User  Vaping Use  . Vaping Use: Never used  Substance and Sexual Activity  . Alcohol use: Yes    Alcohol/week: 0.0 standard drinks    Comment: socially  . Drug use: No  . Sexual activity: Yes    Partners: Male    Birth control/protection: None  Other Topics Concern  . Not on file  Social History Narrative   Work or School: Producer, television/film/video,  Catering manager      Home Situation: lives with husband and 69 yo sone in 2016      Spiritual Beliefs: Christian      Lifestyle: no regular exercise, poor diet      Social Determinants of Health   Financial Resource Strain:   . Difficulty of Paying Living Expenses: Not on file  Food Insecurity:   .  Worried About Charity fundraiser in the Last Year: Not on file  . Ran Out of Food in the Last Year: Not on file  Transportation Needs:   . Lack of Transportation (Medical): Not on file  . Lack of Transportation (Non-Medical): Not on file  Physical Activity:   . Days of Exercise per Week: Not on file  . Minutes of Exercise per Session: Not on file  Stress:   . Feeling of Stress : Not on file  Social Connections:   . Frequency of Communication with Friends and Family: Not on file  . Frequency of Social Gatherings with Friends and Family: Not on file  . Attends Religious Services: Not on file  . Active Member of Clubs or Organizations: Not on file  . Attends Archivist Meetings: Not on file  . Marital Status: Not on file    Vitals:   04/06/20 1025  BP: 134/88  Pulse: 93  Resp: 16  Temp: 97.6 F (36.4 C)  SpO2: 98%   Wt Readings from Last 3 Encounters:  04/06/20 233 lb 9.6 oz (106 kg)  12/16/19 254 lb (115.2 kg)  12/03/19 (!) 251 lb 8 oz (114.1 kg)   Body mass index is 36.59 kg/m.  Physical Exam Vitals and nursing note reviewed.  Constitutional:      General: She is in acute distress (In pain).     Appearance: She is well-developed.  HENT:     Head: Normocephalic and atraumatic.  Eyes:     Conjunctiva/sclera: Conjunctivae normal.  Cardiovascular:     Rate and Rhythm: Normal rate and regular rhythm.     Heart sounds: No murmur heard.   Pulmonary:     Effort: Pulmonary effort is normal. No respiratory distress.     Breath sounds: Normal breath sounds.  Chest:       Comments: Nodular lesion palpated in mid upper chest, 4 cm. Mobile,defined  borders. Abdominal:     Palpations: Abdomen is soft. There is no hepatomegaly or mass.     Tenderness: There is no abdominal tenderness.  Genitourinary:    Comments: Tenderness upon palpating breast, L>>R. No masses,nipple discharge, or skin lesions. Musculoskeletal:     Lumbar back: Tenderness (I do not appreciate masses.) present. No bony tenderness. Negative right straight leg raise test and negative left straight leg raise test.       Back:     Comments: Antalgic gait.   Lymphadenopathy:     Cervical: No cervical adenopathy.  Skin:    General: Skin is warm.     Findings: No erythema or rash.  Neurological:     General: No focal deficit present.     Mental Status: She is alert and oriented to person, place, and time.     Deep Tendon Reflexes:     Reflex Scores:      Patellar reflexes are 2+ on the right side and 2+ on the left side.   ASSESSMENT AND PLAN:  [name] was seen today for knot on back.  Diagnoses and all orders for this visit: Orders Placed This Encounter  Procedures  . DG Lumbar Spine Complete  . TSH  . Prolactin  . CBC with Differential/Platelet  . BASIC METABOLIC PANEL WITH GFR  . Hepatitis C antibody  . Ambulatory referral to General Surgery  . Ambulatory referral to Physical Therapy   Lab Results  Component Value Date   TSH 1.97 04/06/2020   Lab Results  Component Value Date  WBC 7.4 04/06/2020   HGB 13.2 04/06/2020   HCT 39.8 04/06/2020   MCV 86.9 04/06/2020   PLT 314 04/06/2020   Lab Results  Component Value Date   CREATININE 0.73 04/06/2020   BUN 18 04/06/2020   NA 139 04/06/2020   K 4.5 04/06/2020   CL 103 04/06/2020   CO2 28 04/06/2020   Breast tenderness in female We discussed possible etiologies. Bilateral, which is reassuring, ? Fibrocystic breast disease. Dx mammogram will be arranged.  Mass of soft tissue of chest ? Sebaceous cyst. Appt with surgeon will be arranged.  Chronic midline low back pain without  sciatica With acute exacerbation. Here in the office and after verbal consent, she receive Toradol 60 mg IM x 1. Recommend continuing with Aleve 220 mg bid x 7 days and Tylenol 500 mg 3-4 times per day. Topical Icy hot may also help. PT will be arranged. Further recommendations according to lumbar X ray.  -     ketorolac (TORADOL) injection 60 mg  Encounter for HCV screening test for low risk patient -     Hepatitis C antibody  Morbid obesity ([place]) She has lost about 18 Lb sine medication was started. Recommend resuming Saxenda 3 mg [place] daily, it can be continue for 3 more months. We discussed benefits of wt loss as well as adverse effects of obesity. Consistency with healthy diet and physical activity recommended.  Essential hypertension DBP mildly elevated. Monitor BP at home. Some side effects of NSAID's discussed. Continue Amlodipine 2.5 mg daily and HCTZ 25 mg daily.  Spent 40 minutes.  During this time history was obtained and documented, examination was performed, prior labs reviewed, and assessment/plan discussed.  Return in about 2 months (around 06/06/2020) for Back pain, wt.   Yzabella Crunk G. [place], MD  [facility]. [place] office.  A few things to remember from today's visit:   Breast tenderness in female - Plan: TSH, Prolactin, CBC with Differential/Platelet  Mass of soft tissue of chest - Plan: Ambulatory referral to General Surgery  Chronic midline low back pain without sciatica - Plan: DG Lumbar Spine Complete, BASIC METABOLIC PANEL WITH GFR, Ambulatory referral to Physical Therapy  Encounter for HCV screening test for low risk patient - Plan: Hepatitis C antibody  If you need refills please call your pharmacy. Do not use My Chart to request refills or for acute issues that need immediate attention.   Local heat and icy hot patch may help. PT will be arranged. Resume Saxenda. Pending diagnostic mammogram. Monitor blood pressure at home. Aleve  220 mg 2 times daily for 7 days may also help. Tylenol 500 mg 3-4 times per day.  Please be sure medication list is accurate. If a new problem present, please set up appointment sooner than planned today.

## 2020-04-07 LAB — CBC WITH DIFFERENTIAL/PLATELET
Absolute Monocytes: 481 cells/uL (ref 200–950)
Basophils Absolute: 22 cells/uL (ref 0–200)
Basophils Relative: 0.3 %
Eosinophils Absolute: 81 cells/uL (ref 15–500)
Eosinophils Relative: 1.1 %
HCT: 39.8 % (ref 35.0–45.0)
Hemoglobin: 13.2 g/dL (ref 11.7–15.5)
Lymphs Abs: 1828 cells/uL (ref 850–3900)
MCH: 28.8 pg (ref 27.0–33.0)
MCHC: 33.2 g/dL (ref 32.0–36.0)
MCV: 86.9 fL (ref 80.0–100.0)
MPV: 9.8 fL (ref 7.5–12.5)
Monocytes Relative: 6.5 %
Neutro Abs: 4988 cells/uL (ref 1500–7800)
Neutrophils Relative %: 67.4 %
Platelets: 314 10*3/uL (ref 140–400)
RBC: 4.58 10*6/uL (ref 3.80–5.10)
RDW: 13.2 % (ref 11.0–15.0)
Total Lymphocyte: 24.7 %
WBC: 7.4 10*3/uL (ref 3.8–10.8)

## 2020-04-07 LAB — BASIC METABOLIC PANEL WITH GFR
BUN: 18 mg/dL (ref 7–25)
CO2: 28 mmol/L (ref 20–32)
Calcium: 9.3 mg/dL (ref 8.6–10.4)
Chloride: 103 mmol/L (ref 98–110)
Creat: 0.73 mg/dL (ref 0.50–1.05)
GFR, Est African American: 111 mL/min/{1.73_m2} (ref >=60)
GFR, Est Non African American: 95 mL/min/{1.73_m2} (ref >=60)
Glucose, Bld: 86 mg/dL (ref 65–99)
Potassium: 4.5 mmol/L (ref 3.5–5.3)
Sodium: 139 mmol/L (ref 135–146)

## 2020-04-07 LAB — HEPATITIS C ANTIBODY
Hepatitis C Ab: NONREACTIVE
SIGNAL TO CUT-OFF: 0.02 (ref <1.00)

## 2020-04-07 LAB — PROLACTIN: Prolactin: 7.4 ng/mL

## 2020-04-07 LAB — TSH: TSH: 1.97 mIU/L

## 2020-04-07 NOTE — Telephone Encounter (Signed)
This has been taken care of. Breast center scheduled pt's appts and sent the orders to be cosigned by pcp.

## 2020-04-13 ENCOUNTER — Ambulatory Visit: Payer: Self-pay | Admitting: Urology

## 2020-04-20 ENCOUNTER — Telehealth: Payer: Self-pay | Admitting: Family Medicine

## 2020-04-20 NOTE — Telephone Encounter (Signed)
Patient called back and stated that she is scheduled for a mammogram and wanted to make sure that they are going to look at the lump on her breast, please advise. CB is [phone]

## 2020-04-20 NOTE — Telephone Encounter (Signed)
I spoke with pt. She is aware they will do the mammogram & then do the ultrasound.

## 2020-04-22 ENCOUNTER — Ambulatory Visit: Payer: 59

## 2020-04-22 ENCOUNTER — Ambulatory Visit
Admission: RE | Admit: 2020-04-22 | Discharge: 2020-04-22 | Disposition: A | Discharge status: Home / Self Care | Payer: 59 | Source: Ambulatory Visit | Attending: Family Medicine | Admitting: Family Medicine

## 2020-04-22 ENCOUNTER — Other Ambulatory Visit: Payer: Self-pay

## 2020-04-22 DIAGNOSIS — N6310 Unspecified lump in the right breast, unspecified quadrant: Secondary | ICD-10-CM

## 2020-04-22 DIAGNOSIS — R928 Other abnormal and inconclusive findings on diagnostic imaging of breast: Secondary | ICD-10-CM | POA: Diagnosis not present

## 2020-04-22 DIAGNOSIS — N644 Mastodynia: Secondary | ICD-10-CM

## 2020-04-22 DIAGNOSIS — N6001 Solitary cyst of right breast: Secondary | ICD-10-CM | POA: Diagnosis not present

## 2020-04-28 ENCOUNTER — Telehealth: Payer: Self-pay | Admitting: Family Medicine

## 2020-04-28 NOTE — Telephone Encounter (Signed)
[facility] told pt to wait to get booster for after her mammogram.  She wants to know if now that she has had it if it would be ok to get her Covid booster.  Patient is wanting a return call.

## 2020-05-13 ENCOUNTER — Other Ambulatory Visit: Payer: 59

## 2020-05-29 ENCOUNTER — Other Ambulatory Visit: Payer: Self-pay | Admitting: Surgery

## 2020-05-29 DIAGNOSIS — L723 Sebaceous cyst: Secondary | ICD-10-CM | POA: Diagnosis not present

## 2020-05-29 DIAGNOSIS — L089 Local infection of the skin and subcutaneous tissue, unspecified: Secondary | ICD-10-CM | POA: Diagnosis not present

## 2020-06-12 ENCOUNTER — Other Ambulatory Visit: Payer: Self-pay

## 2020-06-12 ENCOUNTER — Encounter (HOSPITAL_BASED_OUTPATIENT_CLINIC_OR_DEPARTMENT_OTHER): Payer: Self-pay | Admitting: Surgery

## 2020-06-15 ENCOUNTER — Other Ambulatory Visit (HOSPITAL_COMMUNITY): Payer: 59

## 2020-06-15 ENCOUNTER — Encounter (HOSPITAL_BASED_OUTPATIENT_CLINIC_OR_DEPARTMENT_OTHER)
Admission: RE | Admit: 2020-06-15 | Discharge: 2020-06-15 | Disposition: A | Discharge status: Home / Self Care | Payer: 59 | Source: Ambulatory Visit | Attending: Surgery | Admitting: Surgery

## 2020-06-15 DIAGNOSIS — Z01812 Encounter for preprocedural laboratory examination: Secondary | ICD-10-CM | POA: Insufficient documentation

## 2020-06-15 LAB — BASIC METABOLIC PANEL
Anion gap: 9 (ref 5–15)
BUN: 17 mg/dL (ref 6–20)
CO2: 26 mmol/L (ref 22–32)
Calcium: 8.5 mg/dL — ABNORMAL LOW (ref 8.9–10.3)
Chloride: 103 mmol/L (ref 98–111)
Creatinine, Ser: 0.67 mg/dL (ref 0.44–1.00)
GFR, Estimated: >60 mL/min (ref >60)
Glucose, Bld: 87 mg/dL (ref 70–99)
Potassium: 4 mmol/L (ref 3.5–5.1)
Sodium: 138 mmol/L (ref 135–145)

## 2020-06-15 MED ORDER — ENSURE PRE-SURGERY PO LIQD: 296.0000 mL | Freq: Once | ORAL | Status: DC

## 2020-06-15 MED FILL — SAXENDA 18 MG/3 ML PEN: 18 | 30 days supply | Qty: 15 | Fill #1

## 2020-06-15 MED FILL — HYDROCHLOROTHIAZIDE 25 MG T: 25 | 90 days supply | Qty: 90 | Fill #1

## 2020-06-15 MED FILL — AMLODIPINE BESYLATE 2.5 MG: 2.5 | 90 days supply | Qty: 90 | Fill #1

## 2020-06-15 NOTE — Progress Notes (Signed)

## 2020-06-16 ENCOUNTER — Other Ambulatory Visit (HOSPITAL_COMMUNITY)
Admission: RE | Admit: 2020-06-16 | Discharge: 2020-06-16 | Disposition: A | Discharge status: Home / Self Care | Payer: 59 | Source: Ambulatory Visit | Attending: Surgery | Admitting: Surgery

## 2020-06-16 DIAGNOSIS — Z01812 Encounter for preprocedural laboratory examination: Secondary | ICD-10-CM | POA: Diagnosis not present

## 2020-06-16 DIAGNOSIS — Z20822 Contact with and (suspected) exposure to covid-19: Secondary | ICD-10-CM | POA: Insufficient documentation

## 2020-06-16 LAB — SARS CORONAVIRUS 2 (TAT 6-24 HRS): SARS Coronavirus 2: NEGATIVE

## 2020-06-17 NOTE — H&P (Signed)
Kayla [name] Appointment: 05/29/2020 10:20 AM Location: [facility] Patient #: [id number] DOB: 03-May-1971 Married / Language: English / Race: Black or African American Female   History of Present Illness ([name] MD; 05/29/2020 10:36 AM) The patient is a 50 year old female who presents with skin lesions.  Chief complaint: Painful right breast cyst  This is a 50 year old female who prevents with a painful cyst in her right breast. It is in the upper inner quadrant of the right breast near the sternum. She reports it is has been present for several years but has recently become more painful and will occasionally develop erythema and drained. She had her mammogram and ultrasound December 2021. The cyst appeared consistent with a chronically infected sebaceous cyst. There were no other abnormalities to suggest malignancy at that time. She has had no previous problems with the rest her breast. She denies nipple discharge.   Allergies [name], CMA; 05/29/2020 10:24 AM) No Known Drug Allergies  [05/29/2020]:  Medication History [name], CMA; 05/29/2020 10:24 AM) amLODIPine Besylate (2.5MG  Tablet, Oral) Active. Medications Reconciled  Pregnancy / Birth History [name], CMA; 05/29/2020 10:23 AM) Age of menopause  <43    Review of Systems [name] CMA; 05/29/2020 10:23 AM) General Not Present- Appetite Loss, Chills, Fatigue, Fever, Night Sweats, Weight Gain and Weight Loss. Skin Not Present- Change in Wart/Mole, Dryness, Hives, Jaundice, New Lesions, Non-Healing Wounds, Rash and Ulcer. HEENT Not Present- Earache, Hearing Loss, Hoarseness, Nose Bleed, Oral Ulcers, Ringing in the Ears, Seasonal Allergies, Sinus Pain, Sore Throat, Visual Disturbances, Wears glasses/contact lenses and Yellow Eyes. Respiratory Not Present- Bloody sputum, Chronic Cough, Difficulty Breathing, Snoring and Wheezing. Breast Present- Breast Mass. Not Present-  Breast Pain, Nipple Discharge and Skin Changes. Gastrointestinal Not Present- Abdominal Pain, Bloating, Bloody Stool, Change in Bowel Habits, Chronic diarrhea, Constipation, Difficulty Swallowing, Excessive gas, Gets full quickly at meals, Hemorrhoids, Indigestion, Nausea, Rectal Pain and Vomiting. Female Genitourinary Not Present- Frequency, Nocturia, Painful Urination, Pelvic Pain and Urgency. Musculoskeletal Not Present- Back Pain, Joint Pain, Joint Stiffness, Muscle Pain, Muscle Weakness and Swelling of Extremities. Neurological Not Present- Decreased Memory, Fainting, Headaches, Numbness, Seizures, Tingling, Tremor, Trouble walking and Weakness. Psychiatric Not Present- Anxiety, Bipolar, Change in Sleep Pattern, Depression, Fearful and Frequent crying. Endocrine Present- Hair Changes. Not Present- Cold Intolerance, Excessive Hunger, Heat Intolerance, Hot flashes and New Diabetes. Hematology Not Present- Blood Thinners, Easy Bruising, Excessive bleeding, Gland problems, HIV and Persistent Infections.  Vitals ([name] CMA; 05/29/2020 10:24 AM) 05/29/2020 10:23 AM Weight: 242.8 lb Height: 67in Body Surface Area: 2.2 m Body Mass Index: 38.03 kg/m  Pulse: 88 (Regular)  BP: 140/80(Sitting, Left Arm, Standard)       Physical Exam ([name] A. Ninfa Linden MD; 05/29/2020 10:36 AM) The physical exam findings are as follows: Note: On exam, she appears well  There is a 2 cm fluctuant mass in the right breast in the upper inner quadrant of the sternum. It is tender. There is currently no erythema    Assessment & Plan ([name] A. Ninfa Linden MD; 05/29/2020 10:37 AM) INFECTED SEBACEOUS CYST (L72.3) Impression: This appears to be a chronically infected sebaceous cyst. Surgical excision of this is strongly recommended to prevent further infections as well as to rule out malignancy given the location of the breast. I discussed this with her in detail. We discussed risks which includes but is  not limited to bleeding, infection, injury to surrounding structures, recurrence, then the procedures and malignancy is found, etc.  She understands and wished to proceed with surgery which will be scheduled as soon as possible. Should she develop recurrent erythema preoperatively, she will call and I'll start her on antibiotics. Current Plans

## 2020-06-18 ENCOUNTER — Encounter (HOSPITAL_BASED_OUTPATIENT_CLINIC_OR_DEPARTMENT_OTHER): Payer: Self-pay | Admitting: Surgery

## 2020-06-18 ENCOUNTER — Other Ambulatory Visit (HOSPITAL_BASED_OUTPATIENT_CLINIC_OR_DEPARTMENT_OTHER): Payer: Self-pay | Admitting: Surgery

## 2020-06-18 ENCOUNTER — Ambulatory Visit (HOSPITAL_BASED_OUTPATIENT_CLINIC_OR_DEPARTMENT_OTHER)
Admission: RE | Admit: 2020-06-18 | Discharge: 2020-06-18 | Disposition: A | Discharge status: Home / Self Care | Payer: 59 | Attending: Surgery | Admitting: Surgery

## 2020-06-18 ENCOUNTER — Ambulatory Visit (HOSPITAL_BASED_OUTPATIENT_CLINIC_OR_DEPARTMENT_OTHER): Payer: 59 | Admitting: Anesthesiology

## 2020-06-18 ENCOUNTER — Other Ambulatory Visit: Payer: Self-pay

## 2020-06-18 ENCOUNTER — Encounter (HOSPITAL_BASED_OUTPATIENT_CLINIC_OR_DEPARTMENT_OTHER)
Admission: RE | Disposition: A | Discharge status: Home / Self Care | Payer: Self-pay | Source: Home / Self Care | Attending: Surgery

## 2020-06-18 DIAGNOSIS — N6081 Other benign mammary dysplasias of right breast: Secondary | ICD-10-CM | POA: Diagnosis not present

## 2020-06-18 DIAGNOSIS — E119 Type 2 diabetes mellitus without complications: Secondary | ICD-10-CM | POA: Diagnosis not present

## 2020-06-18 DIAGNOSIS — N644 Mastodynia: Secondary | ICD-10-CM | POA: Insufficient documentation

## 2020-06-18 DIAGNOSIS — E559 Vitamin D deficiency, unspecified: Secondary | ICD-10-CM | POA: Diagnosis not present

## 2020-06-18 DIAGNOSIS — N6001 Solitary cyst of right breast: Secondary | ICD-10-CM | POA: Insufficient documentation

## 2020-06-18 DIAGNOSIS — L723 Sebaceous cyst: Secondary | ICD-10-CM | POA: Diagnosis not present

## 2020-06-18 DIAGNOSIS — I10 Essential (primary) hypertension: Secondary | ICD-10-CM | POA: Diagnosis not present

## 2020-06-18 DIAGNOSIS — L720 Epidermal cyst: Secondary | ICD-10-CM | POA: Diagnosis not present

## 2020-06-18 DIAGNOSIS — K219 Gastro-esophageal reflux disease without esophagitis: Secondary | ICD-10-CM | POA: Diagnosis not present

## 2020-06-18 HISTORY — PX: BREAST CYST EXCISION: SHX579

## 2020-06-18 HISTORY — PX: BREAST BIOPSY: SHX20

## 2020-06-18 HISTORY — DX: Solitary cyst of right breast: N60.01

## 2020-06-18 HISTORY — DX: Unspecified osteoarthritis, unspecified site: M19.90

## 2020-06-18 SURGERY — EXCISION, CYST, BREAST
Anesthesia: General | Site: Breast | Laterality: Right

## 2020-06-18 MED ORDER — DEXAMETHASONE SODIUM PHOSPHATE 10 MG/ML IJ SOLN
INTRAMUSCULAR | Status: AC
  Filled 2020-06-18: qty 1

## 2020-06-18 MED ORDER — OXYCODONE HCL 5 MG PO TABS
5.0000 mg | ORAL_TABLET | Freq: Once | ORAL | Status: AC | PRN
  Administered 2020-06-18: 5 mg via ORAL

## 2020-06-18 MED ORDER — MIDAZOLAM HCL 2 MG/2ML IJ SOLN
INTRAMUSCULAR | Status: AC
  Filled 2020-06-18: qty 2

## 2020-06-18 MED ORDER — LIDOCAINE HCL (CARDIAC) PF 100 MG/5ML IV SOSY
PREFILLED_SYRINGE | INTRAVENOUS | Status: DC | PRN
  Administered 2020-06-18: 50 mg via INTRAVENOUS

## 2020-06-18 MED ORDER — KETOROLAC TROMETHAMINE 15 MG/ML IJ SOLN
15.0000 mg | INTRAMUSCULAR | Status: AC
  Administered 2020-06-18: 15 mg via INTRAVENOUS

## 2020-06-18 MED ORDER — MEPERIDINE HCL 25 MG/ML IJ SOLN: 6.2500 mg | INTRAMUSCULAR | Status: DC | PRN

## 2020-06-18 MED ORDER — CHLORHEXIDINE GLUCONATE CLOTH 2 % EX PADS: 6.0000 | MEDICATED_PAD | Freq: Once | CUTANEOUS | Status: DC

## 2020-06-18 MED ORDER — ONDANSETRON HCL 4 MG/2ML IJ SOLN
INTRAMUSCULAR | Status: AC
  Filled 2020-06-18: qty 2

## 2020-06-18 MED ORDER — MIDAZOLAM HCL 5 MG/5ML IJ SOLN
INTRAMUSCULAR | Status: DC | PRN
  Administered 2020-06-18: 2 mg via INTRAVENOUS

## 2020-06-18 MED ORDER — DEXAMETHASONE SODIUM PHOSPHATE 4 MG/ML IJ SOLN
INTRAMUSCULAR | Status: DC | PRN
  Administered 2020-06-18: 10 mg via INTRAVENOUS

## 2020-06-18 MED ORDER — PROPOFOL 10 MG/ML IV BOLUS
INTRAVENOUS | Status: AC
  Filled 2020-06-18: qty 20

## 2020-06-18 MED ORDER — SUCCINYLCHOLINE CHLORIDE 200 MG/10ML IV SOSY
PREFILLED_SYRINGE | INTRAVENOUS | Status: AC
  Filled 2020-06-18: qty 10

## 2020-06-18 MED ORDER — PHENYLEPHRINE 40 MCG/ML (10ML) SYRINGE FOR IV PUSH (FOR BLOOD PRESSURE SUPPORT)
PREFILLED_SYRINGE | INTRAVENOUS | Status: AC
  Filled 2020-06-18: qty 10

## 2020-06-18 MED ORDER — OXYCODONE HCL 5 MG PO TABS
ORAL_TABLET | ORAL | Status: AC
  Filled 2020-06-18: qty 1

## 2020-06-18 MED ORDER — FENTANYL CITRATE (PF) 100 MCG/2ML IJ SOLN
INTRAMUSCULAR | Status: AC
  Filled 2020-06-18: qty 2

## 2020-06-18 MED ORDER — EPHEDRINE SULFATE 50 MG/ML IJ SOLN
INTRAMUSCULAR | Status: DC | PRN
  Administered 2020-06-18: 10 mg via INTRAVENOUS

## 2020-06-18 MED ORDER — HYDROMORPHONE HCL 1 MG/ML IJ SOLN
0.2500 mg | INTRAMUSCULAR | Status: DC | PRN
  Administered 2020-06-18 (×2): 0.25 mg via INTRAVENOUS

## 2020-06-18 MED ORDER — ONDANSETRON HCL 4 MG/2ML IJ SOLN
INTRAMUSCULAR | Status: DC | PRN
  Administered 2020-06-18: 4 mg via INTRAVENOUS

## 2020-06-18 MED ORDER — AMISULPRIDE (ANTIEMETIC) 5 MG/2ML IV SOLN: 10.0000 mg | Freq: Once | INTRAVENOUS | Status: DC | PRN

## 2020-06-18 MED ORDER — TRAMADOL HCL 50 MG PO TABS: 50.0000 mg | ORAL_TABLET | Freq: Four times a day (QID) | ORAL | 0 refills | Status: DC | PRN

## 2020-06-18 MED ORDER — BUPIVACAINE-EPINEPHRINE 0.5% -1:200000 IJ SOLN
INTRAMUSCULAR | Status: DC | PRN
  Administered 2020-06-18: 6 mL

## 2020-06-18 MED ORDER — ACETAMINOPHEN 500 MG PO TABS
1000.0000 mg | ORAL_TABLET | ORAL | Status: AC
  Administered 2020-06-18: 1000 mg via ORAL

## 2020-06-18 MED ORDER — FENTANYL CITRATE (PF) 100 MCG/2ML IJ SOLN
INTRAMUSCULAR | Status: DC | PRN
  Administered 2020-06-18: 100 ug via INTRAVENOUS

## 2020-06-18 MED ORDER — OXYCODONE HCL 5 MG/5ML PO SOLN: 5.0000 mg | Freq: Once | ORAL | Status: AC | PRN

## 2020-06-18 MED ORDER — LACTATED RINGERS IV SOLN: INTRAVENOUS | Status: DC

## 2020-06-18 MED ORDER — LIDOCAINE 2% (20 MG/ML) 5 ML SYRINGE
INTRAMUSCULAR | Status: AC
  Filled 2020-06-18: qty 5

## 2020-06-18 MED ORDER — HYDROMORPHONE HCL 1 MG/ML IJ SOLN
INTRAMUSCULAR | Status: AC
  Filled 2020-06-18: qty 0.5

## 2020-06-18 MED ORDER — CEFAZOLIN SODIUM-DEXTROSE 2-4 GM/100ML-% IV SOLN
2.0000 g | INTRAVENOUS | Status: AC
  Administered 2020-06-18: 2 g via INTRAVENOUS

## 2020-06-18 MED ORDER — EPHEDRINE 5 MG/ML INJ
INTRAVENOUS | Status: AC
  Filled 2020-06-18: qty 10

## 2020-06-18 MED ORDER — CEFAZOLIN SODIUM-DEXTROSE 2-4 GM/100ML-% IV SOLN
INTRAVENOUS | Status: AC
  Filled 2020-06-18: qty 100

## 2020-06-18 MED ORDER — PROMETHAZINE HCL 25 MG/ML IJ SOLN: 6.2500 mg | INTRAMUSCULAR | Status: DC | PRN

## 2020-06-18 MED ORDER — ACETAMINOPHEN 500 MG PO TABS
ORAL_TABLET | ORAL | Status: AC
  Filled 2020-06-18: qty 2

## 2020-06-18 MED ORDER — KETOROLAC TROMETHAMINE 15 MG/ML IJ SOLN
INTRAMUSCULAR | Status: AC
  Filled 2020-06-18: qty 1

## 2020-06-18 MED FILL — traMADol HCL 50 MG TABS: 50 | 5 days supply | Qty: 20 | Fill #0

## 2020-06-18 SURGICAL SUPPLY — 41 items
ADH SKN CLS APL DERMABOND .7 (GAUZE/BANDAGES/DRESSINGS) ×1
APL PRP STRL LF DISP 70% ISPRP (MISCELLANEOUS) ×1
BLADE SURG 15 STRL LF DISP TIS (BLADE) ×1 IMPLANT
BLADE SURG 15 STRL SS (BLADE) ×2
CANISTER SUCT 1200ML W/VALVE (MISCELLANEOUS) IMPLANT
CHLORAPREP W/TINT 26 (MISCELLANEOUS) ×2 IMPLANT
CLIP VESOCCLUDE SM WIDE 6/CT (CLIP) IMPLANT
COVER BACK TABLE 60X90IN (DRAPES) ×2 IMPLANT
COVER MAYO STAND STRL (DRAPES) ×2 IMPLANT
COVER WAND RF STERILE (DRAPES) IMPLANT
DECANTER SPIKE VIAL GLASS SM (MISCELLANEOUS) ×2 IMPLANT
DERMABOND ADVANCED (GAUZE/BANDAGES/DRESSINGS) ×1
DERMABOND ADVANCED .7 DNX12 (GAUZE/BANDAGES/DRESSINGS) ×1 IMPLANT
DRAPE LAPAROTOMY 100X72 PEDS (DRAPES) ×2 IMPLANT
DRAPE UTILITY XL STRL (DRAPES) ×2 IMPLANT
ELECT REM PT RETURN 9FT ADLT (ELECTROSURGICAL) ×2
ELECTRODE REM PT RTRN 9FT ADLT (ELECTROSURGICAL) ×1 IMPLANT
GAUZE SPONGE 4X4 12PLY STRL LF (GAUZE/BANDAGES/DRESSINGS) IMPLANT
GLOVE SURG SIGNA 7.5 PF LTX (GLOVE) ×2 IMPLANT
GLOVE SURG SYN 7.0 (GLOVE) ×2 IMPLANT
GLOVE SURG UNDER POLY LF SZ7 (GLOVE) ×6 IMPLANT
GOWN STRL REUS W/ TWL LRG LVL3 (GOWN DISPOSABLE) ×2 IMPLANT
GOWN STRL REUS W/ TWL XL LVL3 (GOWN DISPOSABLE) ×1 IMPLANT
GOWN STRL REUS W/TWL LRG LVL3 (GOWN DISPOSABLE) ×4
GOWN STRL REUS W/TWL XL LVL3 (GOWN DISPOSABLE) ×2
KIT MARKER MARGIN INK (KITS) IMPLANT
NEEDLE HYPO 25X1 1.5 SAFETY (NEEDLE) ×2 IMPLANT
NS IRRIG 1000ML POUR BTL (IV SOLUTION) ×2 IMPLANT
PACK BASIN DAY SURGERY FS (CUSTOM PROCEDURE TRAY) ×2 IMPLANT
PENCIL SMOKE EVACUATOR (MISCELLANEOUS) ×2 IMPLANT
SLEEVE SCD COMPRESS KNEE MED (MISCELLANEOUS) ×2 IMPLANT
SPONGE LAP 4X18 RFD (DISPOSABLE) ×2 IMPLANT
SUT MNCRL AB 4-0 PS2 18 (SUTURE) ×2 IMPLANT
SUT SILK 2 0 SH (SUTURE) IMPLANT
SUT VIC AB 3-0 SH 27 (SUTURE) ×2
SUT VIC AB 3-0 SH 27X BRD (SUTURE) ×1 IMPLANT
SYR CONTROL 10ML LL (SYRINGE) ×2 IMPLANT
TOWEL GREEN STERILE FF (TOWEL DISPOSABLE) ×2 IMPLANT
TRAY FAXITRON CT DISP (TRAY / TRAY PROCEDURE) IMPLANT
TUBE CONNECTING 20X1/4 (TUBING) IMPLANT
YANKAUER SUCT BULB TIP NO VENT (SUCTIONS) IMPLANT

## 2020-06-18 NOTE — Op Note (Addendum)
EXCISION RIGHT BREAST CHRONICALLY INFECTED SEBACEOUS CYST  Procedure Note  Kayla [name] 06/18/2020   Pre-op Diagnosis:  2 cm CHRONICALLY INFECTED RIGHT BREAST SEBACEOUS CYST     Post-op Diagnosis: same  Procedure(s): EXCISION RIGHT BREAST CHRONICALLY INFECTED SEBACEOUS CYST (2 CM)  Surgeon(s): Coralie Keens, MD  Anesthesia: General  Assist: Carlena Hurl, PA  Staff:  Circulator: Ted Mcalpine, RN Relief Circulator: McDonough-Hughes, Delene Ruffini, RN Relief Scrub: Izora Ribas, RN Scrub Person: Patric Dykes, RN  Estimated Blood Loss: Minimal               Specimens: sent to path  Findings: The patient was found to have a 2 cm chronically infected sebaceous cyst of the right breast.  It was sent to pathology for evaluation  Procedure: The patient was brought to operating identifies correct patient.  She is placed on the operating table and anesthesia was induced.  Her right breast and chest were prepped and draped in usual sterile fashion.  The 2 cm cyst was located in the right medial breast in the upper inner quadrant adjacent to the sternum.  The skin overlying the cyst was anesthetized Marcaine.  I made incision with a scalpel over top of the cyst.  There was a large amount of sebaceous debris in the cyst with no active purulence.  I was able to remove all the sebaceous debris and excised the cyst in its entirety with a scalpel.  It was sent to pathology for evaluation.  We achieved hemostasis with cautery.  The subcutaneous tissue was then closed interrupted 3-0 Vicryl sutures and the skin was closed with running 4-0 Monocryl.  Dermabond was then applied.  The patient tolerated the procedure well.  All the counts were correct at the end of the procedure.  The patient was then taken in stable condition from the operating room to the recovery room.          Coralie Keens   Date: 06/18/2020  Time: 8:57 AM

## 2020-06-18 NOTE — Transfer of Care (Signed)
Immediate Anesthesia Transfer of Care Note  Patient: Kayla [name]  Procedure(s) Performed: EXCISION RIGHT BREAST CHRONICALLY INFECTED SEBACEOUS CYST (Right Breast)  Patient Location: PACU  Anesthesia Type:General  Level of Consciousness: sedated  Airway & Oxygen Therapy: Patient Spontanous Breathing and Patient connected to face mask oxygen  Post-op Assessment: Report given to RN and Post -op Vital signs reviewed and stable  Post vital signs: Reviewed and stable  Last Vitals:  Vitals Value Taken Time  BP 111/78 06/18/20 0905  Temp    Pulse 96 06/18/20 0907  Resp 10 06/18/20 0907  SpO2 96 % 06/18/20 0907  Vitals shown include unvalidated device data.  Last Pain:  Vitals:   06/18/20 0724  TempSrc: Oral  PainSc: 0-No pain      Patients Stated Pain Goal: 4 (75/83/07 4600)  Complications: No complications documented.

## 2020-06-18 NOTE — Anesthesia Postprocedure Evaluation (Signed)
Anesthesia Post Note  Patient: Kayla [name]  Procedure(s) Performed: EXCISION RIGHT BREAST CHRONICALLY INFECTED SEBACEOUS CYST (Right Breast)     Patient location during evaluation: PACU Anesthesia Type: General Level of consciousness: awake and alert Pain management: pain level controlled Vital Signs Assessment: post-procedure vital signs reviewed and stable Respiratory status: spontaneous breathing, nonlabored ventilation and respiratory function stable Cardiovascular status: blood pressure returned to baseline and stable Postop Assessment: no apparent nausea or vomiting Anesthetic complications: no   No complications documented.  Last Vitals:  Vitals:   06/18/20 0930 06/18/20 0951  BP: 108/81 124/86  Pulse: 99 93  Resp: 15 16  Temp:  36.6 C  SpO2: 97% 96%    Last Pain:  Vitals:   06/18/20 1017  TempSrc:   PainSc: 3                  Lynda Rainwater

## 2020-06-18 NOTE — Discharge Instructions (Signed)
Ok to shower starting tomorrow  Ice pack, Tylenol, and ibuprofen also for pain  No vigorous activity for 1 week  Call our office to set up a follow-up appointment  NO TYLENOL OR IBUPROFEN BEFORE 1:30PM TODAY, IF NEEDED.   Post Anesthesia Home Care Instructions  Activity: Get plenty of rest for the remainder of the day. A responsible individual must stay with you for 24 hours following the procedure.  For the next 24 hours, DO NOT: -Drive a car -Paediatric nurse -Drink alcoholic beverages -Take any medication unless instructed by your physician -Make any legal decisions or sign important papers.  Meals: Start with liquid foods such as gelatin or soup. Progress to regular foods as tolerated. Avoid greasy, spicy, heavy foods. If nausea and/or vomiting occur, drink only clear liquids until the nausea and/or vomiting subsides. Call your physician if vomiting continues.  Special Instructions/Symptoms: Your throat may feel dry or sore from the anesthesia or the breathing tube placed in your throat during surgery. If this causes discomfort, gargle with warm salt water. The discomfort should disappear within 24 hours.  If you had a scopolamine patch placed behind your ear for the management of post- operative nausea and/or vomiting:  1. The medication in the patch is effective for 72 hours, after which it should be removed.  Wrap patch in a tissue and discard in the trash. Wash hands thoroughly with soap and water. 2. You may remove the patch earlier than 72 hours if you experience unpleasant side effects which may include dry mouth, dizziness or visual disturbances. 3. Avoid touching the patch. Wash your hands with soap and water after contact with the patch.

## 2020-06-18 NOTE — Interval H&P Note (Signed)
History and Physical Interval Note:no change in H and P  06/18/2020 7:18 AM  Kayla [name]  has presented today for surgery, with the diagnosis of CHRONICALLY INFECTED RIGHT BREAST SEBACEOUS CYST.  The various methods of treatment have been discussed with the patient and family. After consideration of risks, benefits and other options for treatment, the patient has consented to  Procedure(s): EXCISION RIGHT BREAST CHRONICALLY INFECTED SEBACEOUS CYST (Right) as a surgical intervention.  The patient's history has been reviewed, patient examined, no change in status, stable for surgery.  I have reviewed the patient's chart and labs.  Questions were answered to the patient's satisfaction.     Coralie Keens

## 2020-06-18 NOTE — Anesthesia Procedure Notes (Signed)
Procedure Name: LMA Insertion Date/Time: 06/18/2020 8:34 AM Performed by: Willa Frater, CRNA Pre-anesthesia Checklist: Patient identified, Emergency Drugs available, Suction available and Patient being monitored Patient Re-evaluated:Patient Re-evaluated prior to induction Oxygen Delivery Method: Circle system utilized Preoxygenation: Pre-oxygenation with 100% oxygen Induction Type: IV induction Ventilation: Mask ventilation without difficulty LMA: LMA inserted LMA Size: 5.0 Number of attempts: 1 Airway Equipment and Method: Bite block Placement Confirmation: positive ETCO2 Tube secured with: Tape Dental Injury: Teeth and Oropharynx as per pre-operative assessment

## 2020-06-18 NOTE — Anesthesia Preprocedure Evaluation (Signed)
Anesthesia Evaluation  Patient identified by MRN, date of birth, ID band Patient awake    Reviewed: Allergy & Precautions, NPO status , Patient's Chart, lab work & pertinent test results  History of Anesthesia Complications Negative for: history of anesthetic complications  Airway Mallampati: II  TM Distance: >3 FB Neck ROM: Full    Dental no notable dental hx. (+) Teeth Intact, Dental Advisory Given   Pulmonary neg pulmonary ROS, sleep apnea , neg COPD, Current Smoker and Patient abstained from smoking.,  Doesn't wear cpap   Pulmonary exam normal breath sounds clear to auscultation       Cardiovascular Exercise Tolerance: Good METShypertension, (-) CAD and (-) Past MI negative cardio ROS  (-) dysrhythmias  Rhythm:Regular Rate:Normal - Systolic murmurs    Neuro/Psych  Headaches, negative neurological ROS  negative psych ROS   GI/Hepatic neg GERD  ,(+)     (-) substance abuse  ,   Endo/Other  neg diabetes  Renal/GU negative Renal ROS     Musculoskeletal   Abdominal (+) + obese,   Peds  Hematology  (+) anemia ,   Anesthesia Other Findings Past Medical History: No date: Anemia No date: Back pain No date: Constipation No date: Dyspnea No date: Fatigue No date: Headache No date: Hypertension No date: Joint pain No date: Leg edema No date: Obesity No date: Sleep apnea     Comment:  lost weight 2013 gastric sleeve, no longer a problem No date: Sleep disturbance No date: SVD (spontaneous vaginal delivery)     Comment:  x 3 No date: Tobacco use  Reproductive/Obstetrics                             Anesthesia Physical  Anesthesia Plan  ASA: II  Anesthesia Plan: General   Post-op Pain Management:    Induction: Intravenous  PONV Risk Score and Plan: 2 and Ondansetron, Midazolam and Treatment may vary due to age or medical condition  Airway Management Planned:  LMA  Additional Equipment: None  Intra-op Plan:   Post-operative Plan: Extubation in OR  Informed Consent: I have reviewed the patients History and Physical, chart, labs and discussed the procedure including the risks, benefits and alternatives for the proposed anesthesia with the patient or authorized representative who has indicated his/her understanding and acceptance.     Dental advisory given  Plan Discussed with: CRNA and Surgeon  Anesthesia Plan Comments: (Discussed risks of anesthesia with patient, including PONV, sore throat, lip/dental damage. Rare risks discussed as well, such as cardiorespiratory and neurological sequelae. Patient understands.)        Anesthesia Quick Evaluation

## 2020-06-19 ENCOUNTER — Encounter (HOSPITAL_BASED_OUTPATIENT_CLINIC_OR_DEPARTMENT_OTHER): Payer: Self-pay | Admitting: Surgery

## 2020-06-19 LAB — SURGICAL PATHOLOGY

## 2020-08-04 ENCOUNTER — Encounter: Payer: Self-pay | Admitting: Family Medicine

## 2020-08-04 ENCOUNTER — Other Ambulatory Visit: Payer: Self-pay | Admitting: Family Medicine

## 2020-08-04 ENCOUNTER — Telehealth (INDEPENDENT_AMBULATORY_CARE_PROVIDER_SITE_OTHER): Payer: 59 | Admitting: Family Medicine

## 2020-08-04 VITALS — Ht 66.0 in

## 2020-08-04 DIAGNOSIS — Z1211 Encounter for screening for malignant neoplasm of colon: Secondary | ICD-10-CM

## 2020-08-04 DIAGNOSIS — F17200 Nicotine dependence, unspecified, uncomplicated: Secondary | ICD-10-CM

## 2020-08-04 DIAGNOSIS — I10 Essential (primary) hypertension: Secondary | ICD-10-CM | POA: Diagnosis not present

## 2020-08-04 DIAGNOSIS — R1310 Dysphagia, unspecified: Secondary | ICD-10-CM | POA: Diagnosis not present

## 2020-08-04 MED ORDER — AMLODIPINE BESYLATE 2.5 MG PO TABS: 2.5000 mg | ORAL_TABLET | Freq: Every day | ORAL | 1 refills | Status: DC

## 2020-08-04 MED ORDER — HYDROCHLOROTHIAZIDE 25 MG PO TABS: 25.0000 mg | ORAL_TABLET | Freq: Every day | ORAL | 1 refills | Status: DC

## 2020-08-04 MED ORDER — OMEPRAZOLE 40 MG PO CPDR: 40.0000 mg | DELAYED_RELEASE_CAPSULE | Freq: Every day | ORAL | 3 refills | Status: DC

## 2020-08-04 MED ORDER — CHANTIX STARTING MONTH PAK 0.5 MG X 11 & 1 MG X 42 PO TABS
ORAL_TABLET | ORAL | 0 refills | Status: DC
Start: 2020-08-04 — End: 2020-08-07

## 2020-08-04 NOTE — Progress Notes (Signed)
Virtual Visit via Video Note I connected with [name] on 08/04/20 by a video enabled telemedicine application and verified that I am speaking with the correct person using two identifiers.  Location patient: home Location provider:Home office Persons participating in the virtual visit: patient, provider  I discussed the limitations of evaluation and management by telemedicine and the availability of in person appointments. The patient expressed understanding and agreed to proceed.  Chief Complaint  Patient presents with  . discuss referral    HPI: Kayla [name] is a 50 yo female with hx of DM II,HTN,HLD,allergies,OSA,and tobacco use disorder c/o difficulty swallowing. When swallowing she feels like food gets stuck in her mid upper chest, it does "hurt badly" for a few minutes.Sometimes she has to stop eating until she feels food "finally going down." States that problem has been going on for "a while" but a few days ago she had a "bad" episode at work while eating lunch.  Denies abnormal wt loss,unusual fatigue, abdominal pain, nausea, vomiting, changes in bowel habits. She has not checked stool for blood in or melena. She has not noted sore throat or heartburn. She has not tried OTC medications.  Also mentions that she has seen some blood mixed with mucus when she brushes her teeth in the morning. She has not noted nose bleed and she is not sure if blood if from gums. + Tobacco use since age 28, 1/2-1 PPD. Negative for cough or wheezing.  + Postnasal drainage, sometimes greenish or yellowish, usually in the morning and for a while.  Colonoscopy in 04/2015. 5 years f/u was recommended, positive FHx for colon cancer.  HTN: She is not checking BP at home. Currently she is on HCTZ 25 mg daily and Amlodipine 2.5 mg daily. Negative for severe/frequent headache, visual changes, exertional chest pain, dyspnea, palpitation, focal weakness, or edema.  Lab Results  Component Value Date    CREATININE 0.67 06/15/2020   BUN 17 06/15/2020   NA 138 06/15/2020   K 4.0 06/15/2020   CL 103 06/15/2020   CO2 26 06/15/2020   ROS: See pertinent positives and negatives per HPI.  Past Medical History:  Diagnosis Date  . Anemia   . Arthritis    lt knee  . Back pain   . Breast cyst, right   . Constipation   . Dyspnea   . Fatigue   . Hypertension   . Joint pain   . Leg edema   . Obesity   . Sleep apnea    lost weight 2013 gastric sleeve, no longer a problem  . Sleep disturbance   . SVD (spontaneous vaginal delivery)    x 3  . Tobacco use     Past Surgical History:  Procedure Laterality Date  . ABDOMINAL HYSTERECTOMY    . BILATERAL SALPINGECTOMY Bilateral 09/27/2013   Procedure: BILATERAL SALPINGECTOMY;  Surgeon: Lahoma Crocker, MD;  Location: [facility];  Service: Gynecology;  Laterality: Bilateral;  . BREAST CYST EXCISION Right 06/18/2020   Procedure: EXCISION RIGHT BREAST CHRONICALLY INFECTED SEBACEOUS CYST;  Surgeon: Coralie Keens, MD;  Location: [facility];  Service: General;  Laterality: Right;  . CESAREAN SECTION     x 1  . CYSTO WITH HYDRODISTENSION N/A 09/30/2019   Procedure: CYSTOSCOPY/HYDRODISTENSION;  Surgeon: Bjorn Loser, MD;  Location: [facility];  Service: Urology;  Laterality: N/A;  fulgeration  . CYSTOSCOPY WITH BIOPSY N/A 09/30/2019   Procedure: CYSTOSCOPY WITH BLADDER BIOPSY;  Surgeon: Bjorn Loser, MD;  Location: [facility];  Service: Urology;  Laterality: N/A;  . HAND FUSION    . LAPAROTOMY Left 09/27/2013   Procedure: EXPLORATORY LAPAROTOMY W/ EXPLORATION OF LEFT ILLIAC ARTERY & VEIN;  Surgeon: Lahoma Crocker, MD;  Location: [facility];  Service: Gynecology;  Laterality: Left;  . ROBOTIC ASSISTED TOTAL HYSTERECTOMY N/A 09/27/2013   Procedure: ROBOTIC ASSISTED TOTAL HYSTERECTOMY;  Surgeon: Lahoma Crocker, MD;  Location: [facility];  Service: Gynecology;  Laterality: N/A;  . SLEEVE GASTROPLASTY  2013   in [place]    Family History   Problem Relation Age of Onset  . Hypertension Mother   . Colon polyps Mother   . Hyperlipidemia Mother   . Colon cancer Maternal Grandfather 17  . Colon cancer Maternal Aunt        onset in 74s  . Stomach cancer Maternal Aunt   . Leukemia Paternal Grandmother   . Colon polyps Maternal Uncle   . Colon polyps Maternal Aunt     Social History   Socioeconomic History  . Marital status: Married    Spouse name: Kinnie Scales  . Number of children: 4  . Years of education: Not on file  . Highest education level: Not on file  Occupational History  . Not on file  Tobacco Use  . Smoking status: Current Every Day Smoker    Packs/day: 1.00    Years: 25.00    Pack years: 25.00    Types: Cigarettes  . Smokeless tobacco: Current User  Vaping Use  . Vaping Use: Never used  Substance and Sexual Activity  . Alcohol use: Yes    Alcohol/week: 0.0 standard drinks    Comment: socially  . Drug use: No  . Sexual activity: Yes    Partners: Male    Birth control/protection: Surgical  Other Topics Concern  . Not on file  Social History Narrative   Work or School: Producer, television/film/video, Catering manager      Home Situation: lives with husband and 59 yo sone in 2016      Spiritual Beliefs: Christian      Lifestyle: no regular exercise, poor diet      Social Determinants of Health   Financial Resource Strain: Not on file  Food Insecurity: Not on file  Transportation Needs: Not on file  Physical Activity: Not on file  Stress: Not on file  Social Connections: Not on file  Intimate Partner Violence: Not on file    Current Outpatient Medications:  .  cetirizine (ZYRTEC) 10 MG tablet, Take 1 tablet (10 mg total) by mouth daily. (Patient taking differently: Take 10 mg by mouth daily as needed for allergies.), Disp: 30 tablet, Rfl: 11 .  fluticasone (FLONASE) 50 MCG/ACT nasal spray, PLACE 2 SPRAYS INTO BOTH NOSTRILS DAILY. (Patient taking differently: Place 2 sprays into both nostrils daily as  needed (allergies.).), Disp: 16 g, Rfl: 3 .  LIDOCAINE EX, Apply 1 application topically 4 (four) times daily as needed (back pain.). OTC, Disp: , Rfl:  .  mirabegron ER (MYRBETRIQ) 50 MG TB24 tablet, Take 1 tablet (50 mg total) by mouth daily., Disp: 30 tablet, Rfl: 11 .  omeprazole (PRILOSEC) 40 MG capsule, Take 1 capsule (40 mg total) by mouth daily., Disp: 30 capsule, Rfl: 3 .  OVER THE COUNTER MEDICATION, Take 1 capsule by mouth daily. [place], Disp: , Rfl:  .  pravastatin (PRAVACHOL) 10 MG tablet, Take 1 tablet (10 mg total) by mouth daily., Disp: 30 tablet, Rfl: 6 .  SAXENDA 18 MG/3ML SOPN, INJECT 0.5 MLS (  3 MG) INTO THE SKIN DAILY, Disp: 15 mL, Rfl: 1 .  traMADol (ULTRAM) 50 MG tablet, Take 1 tablet (50 mg total) by mouth every 6 (six) hours as needed for moderate pain., Disp: 20 tablet, Rfl: 0 .  triamcinolone cream (KENALOG) 0.1 %, Apply 1 application topically daily as needed. On affected areas of upper extremities., Disp: 30 g, Rfl: 3 .  amLODipine (NORVASC) 2.5 MG tablet, Take 1 tablet (2.5 mg total) by mouth daily at 2 PM., Disp: 90 tablet, Rfl: 1 .  hydrochlorothiazide (HYDRODIURIL) 25 MG tablet, Take 1 tablet (25 mg total) by mouth daily., Disp: 90 tablet, Rfl: 1 .  varenicline (CHANTIX STARTING MONTH PAK) 0.5 MG X 11 & 1 MG X 42 tablet, Take one 0.5 mg tablet by mouth once daily for 3 days, then increase to one 0.5 mg tablet twice daily for 4 days, then increase to one 1 mg tablet twice daily., Disp: 53 tablet, Rfl: 0  EXAM:  VITALS per patient if applicable:Ht 5\' 6"  (1.676 m)   LMP 08/02/2013   BMI 39.53 kg/m   GENERAL: alert, oriented, appears well and in no acute distress  HEENT: atraumatic, conjunctiva clear, no obvious abnormalities on inspection.  NECK: normal movements of the head and neck  LUNGS: on inspection no signs of respiratory distress, breathing rate appears normal, no obvious gross SOB, gasping or wheezing  CV: no obvious cyanosis  MS: moves all  visible extremities without noticeable abnormality  PSYCH/NEURO: pleasant and cooperative, no obvious depression or anxiety, speech and thought processing grossly intact  ASSESSMENT AND PLAN:  Discussed the following assessment and plan: Orders Placed This Encounter  Procedures  . Ambulatory referral to Gastroenterology  . Ambulatory Referral for Lung Cancer Scre    Dysphagia, unspecified type - Plan: Ambulatory referral to Gastroenterology We discussed possible etiologies. She agrees with trial of Omeprazole 40 mg daily 30 min before breakfast. GERD precautions. If problem does not resolve she may need EGD. GI referral placed.  Reported blood mixed with mucus when brushing her teeth in the morning could be caused by gum disease, instructed to continue monitoring. Dental examination is appropriate.  Essential hypertension - Plan: amLODipine (NORVASC) 2.5 MG tablet, hydrochlorothiazide (HYDRODIURIL) 25 MG tablet Continue HCTZ and Amlodipine same dose. Low salt diet. Recommend monitoring BP regularly.  Colon cancer screening - Plan: Ambulatory referral to Gastroenterology  Tobacco use disorder - Plan: varenicline (CHANTIX STARTING MONTH PAK) 0.5 MG X 11 & 1 MG X 42 tablet We discussed adverse effects of tobacco use and benefits of smoking cessation. We reviewed treatment options and some side effects. Chantix has helped in the past, she would like to try again. We discussed current guidelines for lung cancer screening. 20-35 packs per year. Referral for lung cancer screening counseling placed.  I discussed the assessment and treatment plan with the patient. Ms. Foresta was provided an opportunity to ask questions and all were answered. She agreed with the plan and demonstrated an understanding of the instructions.   Return in about 4 months (around 12/04/2020) for HTN,HLD,DM II.    Fawzi Melman [place], MD

## 2020-08-04 NOTE — Progress Notes (Signed)
Patient scheduled.

## 2020-08-05 ENCOUNTER — Other Ambulatory Visit: Payer: Self-pay | Admitting: *Deleted

## 2020-08-05 DIAGNOSIS — F17210 Nicotine dependence, cigarettes, uncomplicated: Secondary | ICD-10-CM

## 2020-08-07 ENCOUNTER — Other Ambulatory Visit: Payer: Self-pay | Admitting: Family Medicine

## 2020-08-07 DIAGNOSIS — F17200 Nicotine dependence, unspecified, uncomplicated: Secondary | ICD-10-CM

## 2020-08-07 MED ORDER — VARENICLINE TARTRATE 1 MG PO TABS: ORAL_TABLET | ORAL | 0 refills | Status: DC

## 2020-08-08 ENCOUNTER — Other Ambulatory Visit (HOSPITAL_COMMUNITY): Payer: Self-pay

## 2020-08-08 MED FILL — Varenicline Tartrate Tab 1 MG (Base Equiv): ORAL | 28 days supply | Qty: 53 | Fill #0 | Status: CN

## 2020-08-08 MED FILL — Omeprazole Cap Delayed Release 40 MG: ORAL | 30 days supply | Qty: 30 | Fill #0 | Status: CN

## 2020-08-09 ENCOUNTER — Other Ambulatory Visit (HOSPITAL_COMMUNITY): Payer: Self-pay

## 2020-08-14 ENCOUNTER — Other Ambulatory Visit: Payer: Self-pay | Admitting: Family Medicine

## 2020-08-14 ENCOUNTER — Other Ambulatory Visit (HOSPITAL_COMMUNITY): Payer: Self-pay

## 2020-08-14 ENCOUNTER — Telehealth: Payer: Self-pay | Admitting: Family Medicine

## 2020-08-14 MED ORDER — NICOTINE 21 MG/24HR TD PT24
21.0000 mg | MEDICATED_PATCH | Freq: Every day | TRANSDERMAL | 0 refills | Status: AC
  Filled 2020-08-14 – 2020-08-20 (×2): qty 28, 28d supply, fill #0

## 2020-08-14 NOTE — Telephone Encounter (Signed)
Referral for colonoscopy has already been placed, can you send the nicotine patches?

## 2020-08-14 NOTE — Telephone Encounter (Signed)
Pt call and stated she want dr.Jordan to put in for her a colonoscopy and she also stated she want to try the Nicotine patch's  And want it sent to  [place] Phone:  [phone]  Fax:  [phone]

## 2020-08-14 NOTE — Telephone Encounter (Signed)
I have already sent a prescription for Chantix a few days ago, so discontinued. Nicotine patches 21 mg to use one patch daily for 28 days, then we will titrate dose to 14 mg and then to 7 mg. Thanks, BJ

## 2020-08-17 ENCOUNTER — Other Ambulatory Visit (HOSPITAL_COMMUNITY): Payer: Self-pay

## 2020-08-17 NOTE — Telephone Encounter (Signed)
I spoke with pt, she is aware Rx was sent in & colonoscopy has been ordered.

## 2020-08-18 ENCOUNTER — Other Ambulatory Visit (HOSPITAL_COMMUNITY): Payer: Self-pay

## 2020-08-20 ENCOUNTER — Encounter: Payer: Self-pay | Admitting: Internal Medicine

## 2020-08-20 ENCOUNTER — Other Ambulatory Visit (HOSPITAL_COMMUNITY): Payer: Self-pay

## 2020-08-24 ENCOUNTER — Encounter: Payer: Self-pay | Admitting: Internal Medicine

## 2020-08-24 ENCOUNTER — Other Ambulatory Visit (HOSPITAL_COMMUNITY): Payer: Self-pay

## 2020-09-09 ENCOUNTER — Encounter: Payer: Self-pay | Admitting: Acute Care

## 2020-09-09 ENCOUNTER — Other Ambulatory Visit: Payer: Self-pay

## 2020-09-09 ENCOUNTER — Ambulatory Visit (INDEPENDENT_AMBULATORY_CARE_PROVIDER_SITE_OTHER): Payer: 59 | Admitting: Acute Care

## 2020-09-09 ENCOUNTER — Ambulatory Visit
Admission: RE | Admit: 2020-09-09 | Discharge: 2020-09-09 | Disposition: A | Discharge status: Home / Self Care | Payer: 59 | Source: Ambulatory Visit | Attending: Acute Care | Admitting: Acute Care

## 2020-09-09 VITALS — BP 124/74 | HR 97 | Temp 98.0°F | Ht 66.0 in | Wt 242.6 lb

## 2020-09-09 DIAGNOSIS — F17210 Nicotine dependence, cigarettes, uncomplicated: Secondary | ICD-10-CM | POA: Diagnosis not present

## 2020-09-09 DIAGNOSIS — Z87891 Personal history of nicotine dependence: Secondary | ICD-10-CM

## 2020-09-09 DIAGNOSIS — Z122 Encounter for screening for malignant neoplasm of respiratory organs: Secondary | ICD-10-CM

## 2020-09-09 NOTE — Patient Instructions (Signed)
Thank you for participating in the [facility]. It was our pleasure to meet you today. We will call you with the results of your scan within the next few days. Your scan will be assigned a Lung RADS category score by the physicians reading the scans.  This Lung RADS score determines follow up scanning.  See below for description of categories, and follow up screening recommendations. We will be in touch to schedule your follow up screening annually or based on recommendations of our providers. We will fax a copy of your scan results to your Primary Care Physician, or the physician who referred you to the program, to ensure they have the results. Please call the office if you have any questions or concerns regarding your scanning experience or results.  Our office number is [phone]. Please speak with Denise Phelps, RN. She is our Lung Cancer Screening RN. If she is unavailable when you call, please have the office staff send her a message. She will return your call at her earliest convenience. Remember, if your scan is normal, we will scan you annually as long as you continue to meet the criteria for the program. (Age 55-77, Current smoker or smoker who has quit within the last 15 years). If you are a smoker, remember, quitting is the single most powerful action that you can take to decrease your risk of lung cancer and other pulmonary, breathing related problems. We know quitting is hard, and we are here to help.  Please let us know if there is anything we can do to help you meet your goal of quitting. If you are a former smoker, congratulations. We are proud of you! Remain smoke free! Remember you can refer friends or family members through the number above.  We will screen them to make sure they meet criteria for the program. Thank you for helping us take better care of you by participating in Lung Screening.  Lung RADS Categories:  Lung RADS 1: no nodules  or definitely non-concerning nodules.  Recommendation is for a repeat annual scan in 12 months.  Lung RADS 2:  nodules that are non-concerning in appearance and behavior with a very low likelihood of becoming an active cancer. Recommendation is for a repeat annual scan in 12 months.  Lung RADS 3: nodules that are probably non-concerning , includes nodules with a low likelihood of becoming an active cancer.  Recommendation is for a 6-month repeat screening scan. Often noted after an upper respiratory illness. We will be in touch to make sure you have no questions, and to schedule your 6-month scan.  Lung RADS 4 A: nodules with concerning findings, recommendation is most often for a follow up scan in 3 months or additional testing based on our provider's assessment of the scan. We will be in touch to make sure you have no questions and to schedule the recommended 3 month follow up scan.  Lung RADS 4 B:  indicates findings that are concerning. We will be in touch with you to schedule additional diagnostic testing based on our provider's  assessment of the scan.   

## 2020-09-09 NOTE — Progress Notes (Addendum)
Shared Decision Making Visit Lung Cancer Screening Program [phone])   Eligibility:  Age 50 y.o.  Pack Years Smoking History Calculation 54 pack year smoking history (# packs/per year x # years smoked)  Recent History of coughing up blood  no  Unexplained weight loss? no ( >Than 15 pounds within the last 6 months )  Prior History Lung / other cancer no (Diagnosis within the last 5 years already requiring surveillance chest CT Scans).  Smoking Status Former smoker  Former Smokers: Years since quit: 2 months ago  Quit Date:07/2020  Visit Components:  Discussion included one or more decision making aids. yes  Discussion included risk/benefits of screening. yes  Discussion included potential follow up diagnostic testing for abnormal scans. yes  Discussion included meaning and risk of over diagnosis. yes  Discussion included meaning and risk of False Positives. yes  Discussion included meaning of total radiation exposure. yes  Counseling Included:  Importance of adherence to annual lung cancer LDCT screening. yes  Impact of comorbidities on ability to participate in the program. yes  Ability and willingness to under diagnostic treatment. yes  Smoking Cessation Counseling:  Current Smokers:   Discussed importance of smoking cessation. yes  Information about tobacco cessation classes and interventions provided to patient. yes  Patient provided with "ticket" for LDCT Scan. yes  Symptomatic Patient. no  Counseling  Diagnosis Code: Tobacco Use Z72.0  Asymptomatic Patient yes  Counseling (Intermediate counseling: > three minutes counseling) O7564  Former Smokers:   Discussed the importance of maintaining cigarette abstinence. yes  Diagnosis Code: Personal History of Nicotine Dependence. P32.951  Information about tobacco cessation classes and interventions provided to patient. Yes  Patient provided with "ticket" for LDCT Scan. yes  Written Order for Lung  Cancer Screening with LDCT placed in Epic. Yes (CT Chest Lung Cancer Screening Low Dose W/O CM) [id number] Z12.2-Screening of respiratory organs Z87.891-Personal history of nicotine dependence  BP 124/74 (BP Location: Left Arm, Cuff Size: Normal)   Pulse 97   Temp 98 F (36.7 C) (Oral)   Ht 5\' 6"  (1.676 m)   Wt 242 lb 9.6 oz (110 kg)   LMP 08/02/2013   SpO2 98%   BMI 39.16 kg/m   I have spent 25 minutes of face to face time with Ms. Gergely discussing the risks and benefits of lung cancer screening. We viewed a power point together that explained in detail the above noted topics. We paused at intervals to allow for questions to be asked and answered to ensure understanding.We discussed that the single most powerful action that she can take to decrease her risk of developing lung cancer is to quit smoking. We discussed whether or not she is ready to commit to setting a quit date. We discussed options for tools to aid in quitting smoking including nicotine replacement therapy, non-nicotine medications, support groups, Quit Smart classes, and behavior modification. We discussed that often times setting smaller, more achievable goals, such as eliminating 1 cigarette a day for a week and then 2 cigarettes a day for a week can be helpful in slowly decreasing the number of cigarettes smoked. This allows for a sense of accomplishment as well as providing a clinical benefit. I gave her the " Be Stronger Than Your Excuses" card with contact information for community resources, classes, free nicotine replacement therapy, and access to mobile apps, text messaging, and on-line smoking cessation help. I have also given her my card and contact information in the event she needs to contact  me. We discussed the time and location of the scan, and that either Doroteo Glassman RN or I will call with the results within 24-48 hours of receiving them. I have offered her  a copy of the power point we viewed  as a resource in the  event they need reinforcement of the concepts we discussed today in the office. The patient verbalized understanding of all of  the above and had no further questions upon leaving the office. They have my contact information in the event they have any further questions.  I spent 3-4 minutes counseling on smoking cessation and the health risks of continued tobacco abuse.  I explained to the patient that there has been a high incidence of coronary artery disease noted on these exams. I explained that this is a non-gated exam therefore degree or severity cannot be determined. This patient is currently on statin therapy. I have asked the patient to follow-up with their PCP regarding any incidental finding of coronary artery disease and management with diet or medication as their PCP  feels is clinically indicated. The patient verbalized understanding of the above and had no further questions upon completion of the visit.    Magdalen Spatz, NP 09/09/2020

## 2020-09-16 NOTE — Progress Notes (Signed)
Please call patient and let them  know their  low dose Ct was read as a Lung RADS 1, negative study: no nodules or definitely benign nodules. Radiology recommendation is for a repeat LDCT in 12 months. .Please let them  know we will order and schedule their  annual screening scan for 09/2021. Please let them  know there was notation of CAD on their  scan.  Please remind the patient  that this is a non-gated exam therefore degree or severity of disease  cannot be determined. Please have them  follow up with their PCP regarding potential risk factor modification, dietary therapy or pharmacologic therapy if clinically indicated. Pt.  is currently on statin therapy. Please place order for annual  screening scan for  09/2021 and fax results to PCP. Thanks so much. 

## 2020-09-17 ENCOUNTER — Other Ambulatory Visit: Payer: Self-pay | Admitting: *Deleted

## 2020-09-17 ENCOUNTER — Encounter: Payer: Self-pay | Admitting: *Deleted

## 2020-09-17 DIAGNOSIS — Z87891 Personal history of nicotine dependence: Secondary | ICD-10-CM

## 2020-10-27 ENCOUNTER — Ambulatory Visit: Payer: 59 | Admitting: *Deleted

## 2020-10-27 ENCOUNTER — Other Ambulatory Visit: Payer: Self-pay

## 2020-10-27 ENCOUNTER — Other Ambulatory Visit (HOSPITAL_COMMUNITY): Payer: Self-pay

## 2020-10-27 VITALS — Ht 66.0 in | Wt 257.0 lb

## 2020-10-27 DIAGNOSIS — Z800 Family history of malignant neoplasm of digestive organs: Secondary | ICD-10-CM

## 2020-10-27 MED ORDER — SUPREP BOWEL PREP KIT 17.5-3.13-1.6 GM/177ML PO SOLN
1.0000 | ORAL | 0 refills | Status: DC
  Filled 2020-10-27 – 2020-11-06 (×2): qty 354, 1d supply, fill #0

## 2020-10-27 NOTE — Progress Notes (Signed)

## 2020-11-04 ENCOUNTER — Other Ambulatory Visit (HOSPITAL_COMMUNITY): Payer: Self-pay

## 2020-11-06 ENCOUNTER — Other Ambulatory Visit (HOSPITAL_COMMUNITY): Payer: Self-pay

## 2020-11-12 ENCOUNTER — Other Ambulatory Visit: Payer: Self-pay

## 2020-11-12 ENCOUNTER — Ambulatory Visit (AMBULATORY_SURGERY_CENTER): Payer: 59 | Admitting: Internal Medicine

## 2020-11-12 ENCOUNTER — Encounter: Payer: Self-pay | Admitting: Internal Medicine

## 2020-11-12 VITALS — BP 118/73 | HR 82 | Temp 97.8°F | Resp 20 | Ht 66.0 in | Wt 257.0 lb

## 2020-11-12 DIAGNOSIS — Z1211 Encounter for screening for malignant neoplasm of colon: Secondary | ICD-10-CM | POA: Diagnosis not present

## 2020-11-12 DIAGNOSIS — Z800 Family history of malignant neoplasm of digestive organs: Secondary | ICD-10-CM

## 2020-11-12 HISTORY — PX: COLONOSCOPY: SHX174

## 2020-11-12 MED ORDER — SODIUM CHLORIDE 0.9 % IV SOLN: 500.0000 mL | Freq: Once | INTRAVENOUS | Status: DC

## 2020-11-12 NOTE — Op Note (Signed)
[place] Patient Name: Kayla [name] Procedure Date: 11/12/2020 10:57 AM MRN: [id number] Endoscopist: Gatha Mayer , MD Age: 50 Referring MD:  Date of Birth: 19-Nov-1970 Gender: Female Account #: [id number] Procedure:                Colonoscopy Indications:              Colon cancer screening in patient at increased                            risk: Family history of colorectal cancer in                            multiple 2nd degree relatives, Last colonoscopy:                            2016 Medicines:                Propofol per Anesthesia, Monitored Anesthesia Care Procedure:                Pre-Anesthesia Assessment:                           - Prior to the procedure, a History and Physical                            was performed, and patient medications and                            allergies were reviewed. The patient's tolerance of                            previous anesthesia was also reviewed. The risks                            and benefits of the procedure and the sedation                            options and risks were discussed with the patient.                            All questions were answered, and informed consent                            was obtained. Prior Anticoagulants: The patient has                            taken no previous anticoagulant or antiplatelet                            agents. ASA Grade Assessment: II - A patient with                            mild systemic disease. After reviewing the risks  and benefits, the patient was deemed in                            satisfactory condition to undergo the procedure.                           After obtaining informed consent, the colonoscope                            was passed under direct vision. Throughout the                            procedure, the patient's blood pressure, pulse, and                            oxygen saturations were monitored  continuously. The                            CF [id number] #[id number] was introduced through the anus                            and advanced to the the cecum, identified by                            appendiceal orifice and ileocecal valve. The                            colonoscopy was performed without difficulty. The                            patient tolerated the procedure well. The quality                            of the bowel preparation was excellent. The bowel                            preparation used was SUPREP via split dose                            instruction. The ileocecal valve, appendiceal                            orifice, and rectum were photographed. Scope In: 11:04:15 AM Scope Out: 11:13:15 AM Scope Withdrawal Time: 0 hours 6 minutes 4 seconds  Total Procedure Duration: 0 hours 9 minutes 0 seconds  Findings:                 The perianal and digital rectal examinations were                            normal.                           The entire examined colon appeared normal on direct  and retroflexion views. Complications:            No immediate complications. Estimated Blood Loss:     Estimated blood loss: none. Impression:               - The entire examined colon is normal on direct and                            retroflexion views.                           - No specimens collected. Recommendation:           - Patient has a contact number available for                            emergencies. The signs and symptoms of potential                            delayed complications were discussed with the                            patient. Return to normal activities tomorrow.                            Written discharge instructions were provided to the                            patient.                           - Resume previous diet.                           - Continue present medications.                           - Repeat  colonoscopy in 5 years for screening                            purposes. Family hx CRCA 2 second degree maternal                            relatives + polyps in mom and another maternal                            relative Gatha Mayer, MD 11/12/2020 11:20:25 AM This report has been signed electronically.

## 2020-11-12 NOTE — Progress Notes (Signed)
A/ox3, pleased with MAC, report to RN 

## 2020-11-12 NOTE — Patient Instructions (Addendum)
Colonoscopy normal again.  Your next routine colonoscopy should be in 5 years - 2027.  I appreciate the opportunity to care for you. Gatha Mayer, MD, FACG   YOU HAD AN ENDOSCOPIC PROCEDURE TODAY AT [facility]:   Refer to the procedure report that was given to you for any specific questions about what was found during the examination.  If the procedure report does not answer your questions, please call your gastroenterologist to clarify.  If you requested that your care partner not be given the details of your procedure findings, then the procedure report has been included in a sealed envelope for you to review at your convenience later.  YOU SHOULD EXPECT: Some feelings of bloating in the abdomen. Passage of more gas than usual.  Walking can help get rid of the air that was put into your GI tract during the procedure and reduce the bloating. If you had a lower endoscopy (such as a colonoscopy or flexible sigmoidoscopy) you may notice spotting of blood in your stool or on the toilet paper. If you underwent a bowel prep for your procedure, you may not have a normal bowel movement for a few days.  Please Note:  You might notice some irritation and congestion in your nose or some drainage.  This is from the oxygen used during your procedure.  There is no need for concern and it should clear up in a day or so.  SYMPTOMS TO REPORT IMMEDIATELY:  Following lower endoscopy (colonoscopy or flexible sigmoidoscopy):  Excessive amounts of blood in the stool  Significant tenderness or worsening of abdominal pains  Swelling of the abdomen that is new, acute  Fever of 100F or higher  For urgent or emergent issues, a gastroenterologist can be reached at any hour by calling [phone]. Do not use MyChart messaging for urgent concerns.    DIET:  We do recommend a small meal at first, but then you may proceed to your regular diet.  Drink plenty of fluids but you should avoid  alcoholic beverages for 24 hours.  ACTIVITY:  You should plan to take it easy for the rest of today and you should NOT DRIVE or use heavy machinery until tomorrow (because of the sedation medicines used during the test).    FOLLOW UP: Our staff will call the number listed on your records 48-72 hours following your procedure to check on you and address any questions or concerns that you may have regarding the information given to you following your procedure. If we do not reach you, we will leave a message.  We will attempt to reach you two times.  During this call, we will ask if you have developed any symptoms of COVID 19. If you develop any symptoms (ie: fever, flu-like symptoms, shortness of breath, cough etc.) before then, please call [phone].  If you test positive for Covid 19 in the 2 weeks post procedure, please call and report this information to [place].    If any biopsies were taken you will be contacted by phone or by letter within the next 1-3 weeks.  Please call us at [phone] if you have not heard about the biopsies in 3 weeks.    SIGNATURES/CONFIDENTIALITY: You and/or your care partner have signed paperwork which will be entered into your electronic medical record.  These signatures attest to the fact that that the information above on your After Visit Summary has been reviewed and is understood.  Full responsibility of the  confidentiality of this discharge information lies with you and/or your care-partner.  

## 2020-11-12 NOTE — Progress Notes (Signed)
Pt's states no medical or surgical changes since previsit or office visit.  ° °Vitals CW °

## 2020-11-16 ENCOUNTER — Telehealth: Payer: Self-pay

## 2020-11-16 NOTE — Telephone Encounter (Signed)
Left message on 2nd follow up call. 

## 2020-11-16 NOTE — Telephone Encounter (Signed)
Left message on follow up call. 

## 2020-12-02 NOTE — Progress Notes (Signed)
HPI:  [name] is a 50 y.o. female, who is here today to follow on hypertension, hyperlipidemia, and diabetes.  She was last seen on 08/04/20, video visit, when she was c/o dysphagia. She is currently on omeprazole 40 mg daily as needed, she has noted improvement. She came directly from work and has not slept.  Hypertension: Problem diagnosed around 2008-2009. Medications:HCTZ 25 mg daily and Amlodipine 2.5 mg daily. BP readings at home: Not checking. Side effects: None  Negative for unusual or severe headache, visual changes, exertional chest pain, dyspnea,  focal weakness, or edema.  Lab Results  Component Value Date   CREATININE 0.67 06/15/2020   BUN 17 06/15/2020   NA 138 06/15/2020   K 4.0 06/15/2020   CL 103 06/15/2020   CO2 26 06/15/2020   Diabetes Mellitus II: Dx'ed in 2009 and in remission after bariatric surgery in 2011. - Checking BG at home: Not checking. - Medications: On nonpharmacologic treatment. - Compliance: N/A - Diet: He has not been consistent with following dietary recommendations. - Exercise: She is very active at work, walks a lot. - eye exam: Over a year ago. - foot exam: Overdue - microalbumin: Over the - Negative for symptoms of hypoglycemia, polyuria, polydipsia, numbness extremities, foot ulcers/trauma  Lab Results  Component Value Date   HGBA1C 5.6 12/03/2019   Hyperlipidemia: She is not taking Pravastatin 10 mg daily. Following a low fat diet: Not consistently. Due to work schedule she is eating right before bed time. Side effects from medication: None. Lab Results  Component Value Date   CHOL 153 12/03/2019   HDL 47 (L) 12/03/2019   LDLCALC 88 12/03/2019   TRIG 90 12/03/2019   CHOLHDL 3.3 12/03/2019   Vit D deficiency: She is not on vit D supplementation.  Obesity: She is not taking Saxenda. Panning on starting a special diet next month. She was planning on having bariatric surgery revised but because work schedule  she did not pursue. Getting up a few times to eat and going back to bed. Insomnia: She has not tried OTC sleep aids. She works third shift from Monday through Friday, and does "a lot" over time.  Hx of OSA but resolved after wt loss s/p bariatric procedure. Started back smoking in 10/2020 after family reunion. She has nicotine patches at home, which helped in the past.  Review of Systems  Constitutional:  Positive for fatigue. Negative for activity change, appetite change and fever.  HENT:  Negative for mouth sores, nosebleeds and trouble swallowing.   Respiratory:  Negative for cough and wheezing.   Gastrointestinal:  Negative for abdominal pain, nausea and vomiting.       Negative for changes in bowel habits.  Genitourinary:  Negative for decreased urine volume, dysuria and hematuria.  Musculoskeletal:  Negative for gait problem and myalgias.  Skin:  Negative for pallor and rash.  Neurological:  Negative for syncope, facial asymmetry and weakness.  Psychiatric/Behavioral:  Positive for sleep disturbance. Negative for confusion.   Rest see pertinent positives and negatives per HPI.  Current Outpatient Medications on File Prior to Visit  Medication Sig Dispense Refill   cetirizine (ZYRTEC) 10 MG tablet Take 1 tablet (10 mg total) by mouth daily. (Patient taking differently: Take 10 mg by mouth daily as needed for allergies.) 30 tablet 11   fluticasone (FLONASE) 50 MCG/ACT nasal spray PLACE 2 SPRAYS INTO BOTH NOSTRILS DAILY. (Patient taking differently: Place 2 sprays into both nostrils daily as needed (allergies.).) 16  g 3   LIDOCAINE EX Apply 1 application topically 4 (four) times daily as needed (back pain.). OTC     mirabegron ER (MYRBETRIQ) 50 MG TB24 tablet Take 1 tablet (50 mg total) by mouth daily. 30 tablet 11   omeprazole (PRILOSEC) 40 MG capsule TAKE 1 CAPSULE BY MOUTH DAILY. 30 capsule 3   OVER THE COUNTER MEDICATION Take 1 capsule by mouth daily. [place]     traMADol  (ULTRAM) 50 MG tablet TAKE 1 TABLET BY MOUTH EVERY 6 HOURS AS NEEDED FOR MODERATE PAIN 20 tablet 0   triamcinolone cream (KENALOG) 0.1 % Apply 1 application topically daily as needed. On affected areas of upper extremities. 30 g 3   No current facility-administered medications on file prior to visit.   Past Medical History:  Diagnosis Date   Allergy    Anemia    Arthritis    lt knee   Back pain    Breast cyst, right    Constipation    Dyspnea    Fatigue    GERD (gastroesophageal reflux disease)    Hyperlipidemia    Hypertension    Joint pain    Leg edema    Obesity    Sleep apnea    lost weight 2013 gastric sleeve, no longer a problem   Sleep disturbance    SVD (spontaneous vaginal delivery)    x 3   Tobacco use    No Known Allergies  Social History   Socioeconomic History   Marital status: Married    Spouse name: Kinnie Scales   Number of children: 4   Years of education: Not on file   Highest education level: Not on file  Occupational History   Not on file  Tobacco Use   Smoking status: Former    Packs/day: 1.00    Years: 25.00    Pack years: 25.00    Types: Cigarettes    Quit date: 08/27/2020    Years since quitting: 0.2   Smokeless tobacco: Current  Vaping Use   Vaping Use: Never used  Substance and Sexual Activity   Alcohol use: Yes    Alcohol/week: 0.0 standard drinks    Comment: socially   Drug use: No   Sexual activity: Yes    Partners: Male    Birth control/protection: Surgical  Other Topics Concern   Not on file  Social History Narrative   Work or School: Producer, television/film/video, Catering manager      Home Situation: lives with husband and 78 yo sone in 2016      Spiritual Beliefs: Christian      Lifestyle: no regular exercise, poor diet      Social Determinants of Health   Financial Resource Strain: Not on file  Food Insecurity: Not on file  Transportation Needs: Not on file  Physical Activity: Not on file  Stress: Not on file  Social  Connections: Not on file   Vitals:   12/04/20 0753  BP: 130/80  Pulse: (!) 105  Resp: 16  SpO2: 97%   Body mass index is 40.43 kg/m.  Physical Exam Vitals and nursing note reviewed.  Constitutional:      General: She is not in acute distress.    Appearance: She is well-developed.  HENT:     Head: Normocephalic and atraumatic.     Mouth/Throat:     Mouth: Mucous membranes are dry.     Pharynx: Oropharynx is clear.  Eyes:     Conjunctiva/sclera: Conjunctivae normal.  Cardiovascular:     Rate and Rhythm: Regular rhythm. Tachycardia present.     Pulses:          Dorsalis pedis pulses are 2+ on the right side and 2+ on the left side.     Heart sounds: No murmur heard. Pulmonary:     Effort: Pulmonary effort is normal. No respiratory distress.     Breath sounds: Normal breath sounds.  Abdominal:     Palpations: Abdomen is soft. There is no hepatomegaly or mass.     Tenderness: There is no abdominal tenderness.  Lymphadenopathy:     Cervical: No cervical adenopathy.  Skin:    General: Skin is warm.     Findings: No erythema or rash.  Neurological:     General: No focal deficit present.     Mental Status: She is alert and oriented to person, place, and time.     Cranial Nerves: No cranial nerve deficit.     Gait: Gait normal.  Psychiatric:     Comments: Well groomed, good eye contact.   Diabetic Foot Exam - Simple   Simple Foot Form Diabetic Foot exam was performed with the following findings: Yes 12/04/2020  8:53 AM  Visual Inspection See comments: Yes Sensation Testing Intact to touch and monofilament testing bilaterally: Yes Pulse Check Posterior Tibialis and Dorsalis pulse intact bilaterally: Yes Comments Bunions R>L.    ASSESSMENT AND PLAN:  [name] was seen today for follow-up.  Diagnoses and all orders for this visit: Orders Placed This Encounter  Procedures   Lipid panel   Vitamin D, 25-hydroxy   Hemoglobin A1c   Comprehensive metabolic panel    Microalbumin / creatinine urine ratio   Lab Results  Component Value Date   LABMICR See below: 09/02/2019        Lab Results  Component Value Date   HGBA1C 6.2 12/04/2020   Lab Results  Component Value Date   CHOL 152 12/04/2020   HDL 48.20 12/04/2020   LDLCALC 92 12/04/2020   TRIG 62.0 12/04/2020   CHOLHDL 3 12/04/2020   Lab Results  Component Value Date   CREATININE 0.79 12/04/2020   BUN 20 12/04/2020   NA 140 12/04/2020   K 4.1 12/04/2020   CL 105 12/04/2020   CO2 28 12/04/2020   Lab Results  Component Value Date   ALT 16 12/04/2020   AST 16 12/04/2020   ALKPHOS 53 12/04/2020   BILITOT 0.4 12/04/2020   Type 2 diabetes mellitus in remission ([place]) HgA1C has been at goal at goal. Today Saxenda resume to help with obesity. Regular exercise and healthy diet with avoidance of added sugar food intake is an important part of treatment and encouraged. Annual eye exam, periodic dental and foot care recommended. F/U in 5-6 months   Essential hypertension BP adequately controlled. Continue HCTZ 25 mg daily and amlodipine 2.5 mg daily. Low-salt diet is recommended. Monitor BP at home.  Dyslipidemia (high LDL; low HDL) HDL mildly low. Pravastatin 10 mg daily to potassium. Continue low fat diet. Plenty of vegetable intake and regular physical activity may help to increase HDL.  Vitamin D deficiency, unspecified She is not longer on vitamin D supplementation. Further recommendation will be given according to 25 OH vitamin D result.  Morbid obesity ([place]) Her weight has been stable. We discussed benefits of wt loss as well as adverse effects of obesity. Consistency with healthy diet and physical activity recommended. Weight Watchers is a good option as well as daily brisk  walking for 15-30 min as tolerated.  Insomnia Explained that the main part of treatment is a good sleep hygiene as possible. OTC melatonin 5 to 10 mg 2 hours before bedtime with empty stomach may  help.  I spent a total of 44 minutes in both face to face and non face to face activities for this visit on the date of this encounter. During this time history was obtained and documented, examination was performed, prior labs reviewed, and assessment/plan discussed.  Return in about 5 months (around 05/06/2021) for DM II,HTN,DM,wt.   Bennie Chirico G. [place], MD  [facility]. [place] office.

## 2020-12-04 ENCOUNTER — Other Ambulatory Visit (HOSPITAL_COMMUNITY): Payer: Self-pay

## 2020-12-04 ENCOUNTER — Ambulatory Visit (INDEPENDENT_AMBULATORY_CARE_PROVIDER_SITE_OTHER): Payer: 59 | Admitting: Family Medicine

## 2020-12-04 ENCOUNTER — Encounter: Payer: Self-pay | Admitting: Family Medicine

## 2020-12-04 ENCOUNTER — Other Ambulatory Visit: Payer: Self-pay

## 2020-12-04 VITALS — BP 130/80 | HR 105 | Resp 16 | Ht 66.0 in | Wt 250.5 lb

## 2020-12-04 DIAGNOSIS — E559 Vitamin D deficiency, unspecified: Secondary | ICD-10-CM | POA: Diagnosis not present

## 2020-12-04 DIAGNOSIS — E785 Hyperlipidemia, unspecified: Secondary | ICD-10-CM | POA: Diagnosis not present

## 2020-12-04 DIAGNOSIS — E119 Type 2 diabetes mellitus without complications: Secondary | ICD-10-CM | POA: Diagnosis not present

## 2020-12-04 DIAGNOSIS — I10 Essential (primary) hypertension: Secondary | ICD-10-CM

## 2020-12-04 DIAGNOSIS — G4700 Insomnia, unspecified: Secondary | ICD-10-CM

## 2020-12-04 LAB — COMPREHENSIVE METABOLIC PANEL
ALT: 16 U/L (ref 0–35)
AST: 16 U/L (ref 0–37)
Albumin: 4.1 g/dL (ref 3.5–5.2)
Alkaline Phosphatase: 53 U/L (ref 39–117)
BUN: 20 mg/dL (ref 6–23)
CO2: 28 mEq/L (ref 19–32)
Calcium: 9.4 mg/dL (ref 8.4–10.5)
Chloride: 105 mEq/L (ref 96–112)
Creatinine, Ser: 0.79 mg/dL (ref 0.40–1.20)
GFR: 85.96 mL/min (ref >60.00)
Glucose, Bld: 93 mg/dL (ref 70–99)
Potassium: 4.1 mEq/L (ref 3.5–5.1)
Sodium: 140 mEq/L (ref 135–145)
Total Bilirubin: 0.4 mg/dL (ref 0.2–1.2)
Total Protein: 7.1 g/dL (ref 6.0–8.3)

## 2020-12-04 LAB — LIPID PANEL
Cholesterol: 152 mg/dL (ref 0–200)
HDL: 48.2 mg/dL (ref >39.00)
LDL Cholesterol: 92 mg/dL (ref 0–99)
NonHDL: 104.12
Total CHOL/HDL Ratio: 3
Triglycerides: 62 mg/dL (ref 0.0–149.0)
VLDL: 12.4 mg/dL (ref 0.0–40.0)

## 2020-12-04 LAB — HEMOGLOBIN A1C: Hgb A1c MFr Bld: 6.2 % (ref 4.6–6.5)

## 2020-12-04 LAB — VITAMIN D 25 HYDROXY (VIT D DEFICIENCY, FRACTURES): VITD: 27.32 ng/mL — ABNORMAL LOW (ref 30.00–100.00)

## 2020-12-04 MED ORDER — HYDROCHLOROTHIAZIDE 25 MG PO TABS
ORAL_TABLET | Freq: Every day | ORAL | 2 refills | Status: DC
  Filled 2020-12-04: qty 90, 90d supply, fill #0
  Filled 2021-05-31: qty 90, 90d supply, fill #1

## 2020-12-04 MED ORDER — PRAVASTATIN SODIUM 10 MG PO TABS
10.0000 mg | ORAL_TABLET | Freq: Every day | ORAL | 3 refills | Status: DC
  Filled 2020-12-04: qty 90, 90d supply, fill #0

## 2020-12-04 MED ORDER — AMLODIPINE BESYLATE 2.5 MG PO TABS
ORAL_TABLET | ORAL | 2 refills | Status: DC
  Filled 2020-12-04: qty 90, 90d supply, fill #0
  Filled 2021-05-31: qty 90, 90d supply, fill #1

## 2020-12-04 MED ORDER — LIRAGLUTIDE -WEIGHT MANAGEMENT 18 MG/3ML ~~LOC~~ SOPN
PEN_INJECTOR | SUBCUTANEOUS | 1 refills | Status: DC
Start: 2020-12-04 — End: 2021-11-08
  Filled 2020-12-04: qty 15, 28d supply, fill #0

## 2020-12-04 NOTE — Assessment & Plan Note (Signed)
HDL mildly low. Pravastatin 10 mg daily to potassium. Continue low fat diet. Plenty of vegetable intake and regular physical activity may help to increase HDL.

## 2020-12-04 NOTE — Patient Instructions (Addendum)
A few things to remember from today's visit:  Essential hypertension - Plan: Comprehensive metabolic panel  Type 2 diabetes mellitus in remission ([place]) - Plan: Hemoglobin A1c, Comprehensive metabolic panel  Hyperlipidemia, unspecified hyperlipidemia type - Plan: Lipid panel, Comprehensive metabolic panel  Vitamin D deficiency - Plan: Vitamin D, 25-hydroxy  Morbid obesity ([place]), Chronic  If you need refills please call your pharmacy. Do not use My Chart to request refills or for acute issues that need immediate attention.   Changing eating habits will help with wt loss. Saxenda to be resumed. Melatonin may help with sleep.  Please be sure medication list is accurate. If a new problem present, please set up appointment sooner than planned today.

## 2020-12-04 NOTE — Assessment & Plan Note (Signed)
She is not longer on vitamin D supplementation. Further recommendation will be given according to 25 OH vitamin D result.

## 2020-12-04 NOTE — Assessment & Plan Note (Signed)
Explained that the main part of treatment is a good sleep hygiene as possible. OTC melatonin 5 to 10 mg 2 hours before bedtime with empty stomach may help.

## 2020-12-04 NOTE — Assessment & Plan Note (Signed)
HgA1C has been at goal at goal. Today Saxenda resume to help with obesity. Regular exercise and healthy diet with avoidance of added sugar food intake is an important part of treatment and encouraged. Annual eye exam, periodic dental and foot care recommended. F/U in 5-6 months

## 2020-12-04 NOTE — Assessment & Plan Note (Addendum)
Her weight has been stable. We discussed benefits of wt loss as well as adverse effects of obesity. Consistency with healthy diet and physical activity recommended. Weight Watchers is a good option as well as daily brisk walking for 15-30 min as tolerated.

## 2020-12-04 NOTE — Assessment & Plan Note (Signed)
BP adequately controlled. Continue HCTZ 25 mg daily and amlodipine 2.5 mg daily. Low-salt diet is recommended. Monitor BP at home.

## 2020-12-08 ENCOUNTER — Other Ambulatory Visit (HOSPITAL_COMMUNITY): Payer: Self-pay

## 2020-12-08 MED ORDER — INSULIN PEN NEEDLE 32G X 4 MM MISC
1 refills | Status: DC
  Filled 2020-12-08 – 2021-11-10 (×2): qty 100, 90d supply, fill #0

## 2020-12-16 ENCOUNTER — Other Ambulatory Visit (HOSPITAL_COMMUNITY): Payer: Self-pay

## 2021-03-11 ENCOUNTER — Telehealth (INDEPENDENT_AMBULATORY_CARE_PROVIDER_SITE_OTHER): Payer: 59 | Admitting: Adult Health

## 2021-03-11 ENCOUNTER — Encounter: Payer: Self-pay | Admitting: Adult Health

## 2021-03-11 ENCOUNTER — Other Ambulatory Visit (HOSPITAL_COMMUNITY): Payer: Self-pay

## 2021-03-11 VITALS — Temp 95.8°F | Ht 66.0 in | Wt 250.0 lb

## 2021-03-11 DIAGNOSIS — J0140 Acute pansinusitis, unspecified: Secondary | ICD-10-CM | POA: Diagnosis not present

## 2021-03-11 MED ORDER — DOXYCYCLINE HYCLATE 100 MG PO CAPS
100.0000 mg | ORAL_CAPSULE | Freq: Two times a day (BID) | ORAL | 0 refills | Status: DC
  Filled 2021-03-11: qty 14, 7d supply, fill #0

## 2021-03-11 NOTE — Progress Notes (Signed)
Virtual Visit via Video Note  I connected with Kayla [name] on 03/11/21 at 11:00 AM EDT by a video enabled telemedicine application and verified that I am speaking with the correct person using two identifiers.  Location patient: home Location provider:work or home office Persons participating in the virtual visit: patient, provider  I discussed the limitations of evaluation and management by telemedicine and the availability of in person appointments. The patient expressed understanding and agreed to proceed.   HPI: 50 year old female is being evaluated today for an acute issue.  Her symptoms have been present for roughly 5 to 6 days and have become progressively worse.  Symptoms include productive cough with discolored sputum, sinus pain and pressure,  sore throat, nasal congestion, rhinorrhea with purulent drainage, body aches, and chest congestion.  At home she has been using Mucinex and Chloraseptic with some mild improvement in her symptoms.  She denies fevers, chills, shortness of breath, or wheezing.   ROS: See pertinent positives and negatives per HPI.  Past Medical History:  Diagnosis Date   Allergy    Anemia    Arthritis    lt knee   Back pain    Breast cyst, right    Constipation    Dyspnea    Fatigue    GERD (gastroesophageal reflux disease)    Hyperlipidemia    Hypertension    Joint pain    Leg edema    Obesity    Sleep apnea    lost weight 2013 gastric sleeve, no longer a problem   Sleep disturbance    SVD (spontaneous vaginal delivery)    x 3   Tobacco use     Past Surgical History:  Procedure Laterality Date   ABDOMINAL HYSTERECTOMY     BILATERAL SALPINGECTOMY Bilateral 09/27/2013   Procedure: BILATERAL SALPINGECTOMY;  Surgeon: Lahoma Crocker, MD;  Location: [facility];  Service: Gynecology;  Laterality: Bilateral;   BREAST CYST EXCISION Right 06/18/2020   Procedure: EXCISION RIGHT BREAST CHRONICALLY INFECTED SEBACEOUS CYST;  Surgeon: Coralie Keens, MD;  Location: [facility];  Service: General;  Laterality: Right;   CESAREAN SECTION     x 1   COLONOSCOPY  11/12/2020   2016   CYSTO WITH HYDRODISTENSION N/A 09/30/2019   Procedure: CYSTOSCOPY/HYDRODISTENSION;  Surgeon: Bjorn Loser, MD;  Location: [facility];  Service: Urology;  Laterality: N/A;  fulgeration   CYSTOSCOPY WITH BIOPSY N/A 09/30/2019   Procedure: CYSTOSCOPY WITH BLADDER BIOPSY;  Surgeon: Bjorn Loser, MD;  Location: [facility];  Service: Urology;  Laterality: N/A;   HAND FUSION     LAPAROTOMY Left 09/27/2013   Procedure: EXPLORATORY LAPAROTOMY W/ EXPLORATION OF LEFT ILLIAC ARTERY & VEIN;  Surgeon: Lahoma Crocker, MD;  Location: [facility];  Service: Gynecology;  Laterality: Left;   ROBOTIC ASSISTED TOTAL HYSTERECTOMY N/A 09/27/2013   Procedure: ROBOTIC ASSISTED TOTAL HYSTERECTOMY;  Surgeon: Lahoma Crocker, MD;  Location: [facility];  Service: Gynecology;  Laterality: N/A;   SLEEVE GASTROPLASTY  2013   in [place]    Family History  Problem Relation Age of Onset   Hypertension Mother    Colon polyps Mother    Hyperlipidemia Mother    Colon cancer Maternal Grandfather 34   Colon cancer Maternal Aunt        onset in 61s   Stomach cancer Maternal Aunt    Leukemia Paternal Grandmother    Colon polyps Maternal Uncle    Colon polyps Maternal Aunt        Current Outpatient  Medications:    amLODipine (NORVASC) 2.5 MG tablet, TAKE 1 TABLET BY MOUTH DAILY AT 2 PM., Disp: 90 tablet, Rfl: 2   cetirizine (ZYRTEC) 10 MG tablet, Take 1 tablet (10 mg total) by mouth daily. (Patient taking differently: Take 10 mg by mouth daily as needed for allergies.), Disp: 30 tablet, Rfl: 11   fluticasone (FLONASE) 50 MCG/ACT nasal spray, PLACE 2 SPRAYS INTO BOTH NOSTRILS DAILY. (Patient taking differently: Place 2 sprays into both nostrils daily as needed (allergies.).), Disp: 16 g, Rfl: 3   hydrochlorothiazide (HYDRODIURIL) 25 MG tablet, TAKE 1 TABLET (25 MG TOTAL)  BY MOUTH DAILY., Disp: 90 tablet, Rfl: 2   Insulin Pen Needle 32G X 4 MM MISC, Use as directed with Saxenda daily, Disp: 100 each, Rfl: 1   LIDOCAINE EX, Apply 1 application topically 4 (four) times daily as needed (back pain.). OTC, Disp: , Rfl:    Liraglutide -Weight Management 18 MG/3ML SOPN, INJECT 3 MG (0.5 MLS) INTO THE SKIN DAILY, Disp: 15 mL, Rfl: 1   mirabegron ER (MYRBETRIQ) 50 MG TB24 tablet, Take 1 tablet (50 mg total) by mouth daily., Disp: 30 tablet, Rfl: 11   omeprazole (PRILOSEC) 40 MG capsule, TAKE 1 CAPSULE BY MOUTH DAILY., Disp: 30 capsule, Rfl: 3   OVER THE COUNTER MEDICATION, Take 1 capsule by mouth daily. [place], Disp: , Rfl:    pravastatin (PRAVACHOL) 10 MG tablet, Take 1 tablet (10 mg total) by mouth daily., Disp: 90 tablet, Rfl: 3   triamcinolone cream (KENALOG) 0.1 %, Apply 1 application topically daily as needed. On affected areas of upper extremities., Disp: 30 g, Rfl: 3   doxycycline (VIBRAMYCIN) 100 MG capsule, Take 1 capsule (100 mg total) by mouth 2 (two) times daily., Disp: 14 capsule, Rfl: 0  EXAM:  VITALS per patient if applicable:  GENERAL: alert, oriented, appears ill and in no acute distress  HEENT: atraumatic, conjunttiva clear, no obvious abnormalities on inspection of external nose and ears  NECK: normal movements of the head and neck  LUNGS: on inspection no signs of respiratory distress, breathing rate appears normal, no obvious gross SOB, gasping or wheezing  CV: no obvious cyanosis  MS: moves all visible extremities without noticeable abnormality  PSYCH/NEURO: pleasant and cooperative, no obvious depression or anxiety, speech and thought processing grossly intact  ASSESSMENT AND PLAN:  Discussed the following assessment and plan:  1. Acute non-recurrent pansinusitis - Will start treatment due to worsening symptoms.  Advise rest and hydration.  Follow-up early next week if not improved or resolved - doxycycline (VIBRAMYCIN) 100 MG  capsule; Take 1 capsule (100 mg total) by mouth 2 (two) times daily.  Dispense: 14 capsule; Refill: 0       I discussed the assessment and treatment plan with the patient. The patient was provided an opportunity to ask questions and all were answered. The patient agreed with the plan and demonstrated an understanding of the instructions.   The patient was advised to call back or seek an in-person evaluation if the symptoms worsen or if the condition fails to improve as anticipated.   Dorothyann Peng, NP

## 2021-03-26 ENCOUNTER — Encounter: Payer: Self-pay | Admitting: Family Medicine

## 2021-03-26 ENCOUNTER — Ambulatory Visit (INDEPENDENT_AMBULATORY_CARE_PROVIDER_SITE_OTHER): Payer: 59 | Admitting: Family Medicine

## 2021-03-26 ENCOUNTER — Other Ambulatory Visit (HOSPITAL_COMMUNITY): Payer: Self-pay

## 2021-03-26 VITALS — BP 122/82 | HR 97 | Temp 98.2°F | Resp 16 | Ht 66.0 in | Wt 236.2 lb

## 2021-03-26 DIAGNOSIS — M79604 Pain in right leg: Secondary | ICD-10-CM | POA: Diagnosis not present

## 2021-03-26 DIAGNOSIS — R200 Anesthesia of skin: Secondary | ICD-10-CM

## 2021-03-26 DIAGNOSIS — G8929 Other chronic pain: Secondary | ICD-10-CM | POA: Diagnosis not present

## 2021-03-26 DIAGNOSIS — K5900 Constipation, unspecified: Secondary | ICD-10-CM | POA: Diagnosis not present

## 2021-03-26 DIAGNOSIS — M79605 Pain in left leg: Secondary | ICD-10-CM | POA: Diagnosis not present

## 2021-03-26 DIAGNOSIS — M545 Low back pain, unspecified: Secondary | ICD-10-CM

## 2021-03-26 MED ORDER — PREDNISONE 20 MG PO TABS
ORAL_TABLET | ORAL | 0 refills | Status: AC
  Filled 2021-03-26: qty 20, 7d supply, fill #0

## 2021-03-26 MED ORDER — CELECOXIB 100 MG PO CAPS
100.0000 mg | ORAL_CAPSULE | Freq: Two times a day (BID) | ORAL | 0 refills | Status: AC
  Filled 2021-03-26: qty 20, 10d supply, fill #0

## 2021-03-26 NOTE — Progress Notes (Signed)
ACUTE VISIT Chief Complaint  Patient presents with   Leg Pain    Both legs ache, right foot at times goes numb. Pressure makes the pain worse. Patient says this has been going on a couple of weeks.    HPI: Kayla [name] is a 50 y.o. female with hx of DM II,GERD,HTN, and DOE here today complaining of bilateral LE pain as described above.  Leg Pain  Pertinent negatives include no inability to bear weight, loss of motion, loss of sensation or muscle weakness.   Bilateral anterior thigh down to ankles. 9/10, can be 10/10 when lying down.  Pain is interfering with sleep.She has to get up a walk for a few minutes, which helps with LE pain but aggravates right foot pain. Negative for edema, erythema, CP,worsening SOB, or palpitations. No hx of trauma.  States that last week she had influenza, her "whole body was aching", feeling better now.  +Cold feet and right plantar foot numbness. Right foot pain when she steps on it and with activities like getting off the car. Advil is not helping.  "Always" has back pain. She is not sure if pain is radiated. 04/06/20 lumbar X ray: Mild-to-moderate multilevel lumbar degenerative disc disease, most prominent at L4-5 and L5-S1. Negative for saddle anesthesia or bladder/bowel dysfunction.  Usually daily bowel movements but has not had any in 2 days. Urine is not is clear as it used to be. Negative for abdominal pain,N/V,dysuria,gross hematuria,or increased urine frequency. Overactive bladder on Myrbetriq.  She quit smoking for a few months but back smoking. Exacerbated by stress. 1 pack now lasts 3 days.  Review of Systems  Respiratory:  Negative for cough and wheezing.   Endocrine: Negative for polydipsia, polyphagia and polyuria.  Skin:  Negative for pallor and rash.  Neurological:  Negative for syncope, facial asymmetry and weakness.  Hematological:  Negative for adenopathy. Does not bruise/bleed easily.  Psychiatric/Behavioral:   Positive for sleep disturbance. Negative for confusion.   Rest see pertinent positives and negatives per HPI.  Current Outpatient Medications on File Prior to Visit  Medication Sig Dispense Refill   amLODipine (NORVASC) 2.5 MG tablet TAKE 1 TABLET BY MOUTH DAILY AT 2 PM. 90 tablet 2   cetirizine (ZYRTEC) 10 MG tablet Take 1 tablet (10 mg total) by mouth daily. (Patient taking differently: Take 10 mg by mouth daily as needed for allergies.) 30 tablet 11   doxycycline (VIBRAMYCIN) 100 MG capsule Take 1 capsule (100 mg total) by mouth 2 (two) times daily. 14 capsule 0   fluticasone (FLONASE) 50 MCG/ACT nasal spray PLACE 2 SPRAYS INTO BOTH NOSTRILS DAILY. (Patient taking differently: Place 2 sprays into both nostrils daily as needed (allergies.).) 16 g 3   hydrochlorothiazide (HYDRODIURIL) 25 MG tablet TAKE 1 TABLET (25 MG TOTAL) BY MOUTH DAILY. 90 tablet 2   Insulin Pen Needle 32G X 4 MM MISC Use as directed with Saxenda daily 100 each 1   LIDOCAINE EX Apply 1 application topically 4 (four) times daily as needed (back pain.). OTC     Liraglutide -Weight Management 18 MG/3ML SOPN INJECT 3 MG (0.5 MLS) INTO THE SKIN DAILY 15 mL 1   mirabegron ER (MYRBETRIQ) 50 MG TB24 tablet Take 1 tablet (50 mg total) by mouth daily. 30 tablet 11   omeprazole (PRILOSEC) 40 MG capsule TAKE 1 CAPSULE BY MOUTH DAILY. 30 capsule 3   OVER THE COUNTER MEDICATION Take 1 capsule by mouth daily. [place]  pravastatin (PRAVACHOL) 10 MG tablet Take 1 tablet (10 mg total) by mouth daily. 90 tablet 3   triamcinolone cream (KENALOG) 0.1 % Apply 1 application topically daily as needed. On affected areas of upper extremities. 30 g 3   No current facility-administered medications on file prior to visit.   Past Medical History:  Diagnosis Date   Allergy    Anemia    Arthritis    lt knee   Back pain    Breast cyst, right    Constipation    Dyspnea    Fatigue    GERD (gastroesophageal reflux disease)    Hyperlipidemia     Hypertension    Joint pain    Leg edema    Obesity    Sleep apnea    lost weight 2013 gastric sleeve, no longer a problem   Sleep disturbance    SVD (spontaneous vaginal delivery)    x 3   Tobacco use    No Known Allergies  Social History   Socioeconomic History   Marital status: Married    Spouse name: Kinnie Scales   Number of children: 4   Years of education: Not on file   Highest education level: Not on file  Occupational History   Not on file  Tobacco Use   Smoking status: Former    Packs/day: 1.00    Years: 25.00    Pack years: 25.00    Types: Cigarettes    Quit date: 08/27/2020    Years since quitting: 0.5   Smokeless tobacco: Current  Vaping Use   Vaping Use: Never used  Substance and Sexual Activity   Alcohol use: Yes    Alcohol/week: 0.0 standard drinks    Comment: socially   Drug use: No   Sexual activity: Yes    Partners: Male    Birth control/protection: Surgical  Other Topics Concern   Not on file  Social History Narrative   Work or School: Producer, television/film/video, Catering manager      Home Situation: lives with husband and 36 yo sone in 2016      Spiritual Beliefs: Christian      Lifestyle: no regular exercise, poor diet      Social Determinants of Health   Financial Resource Strain: Not on file  Food Insecurity: Not on file  Transportation Needs: Not on file  Physical Activity: Not on file  Stress: Not on file  Social Connections: Not on file    Vitals:   03/26/21 1058  BP: 122/82  Pulse: 97  Resp: 16  Temp: 98.2 F (36.8 C)  SpO2: 98%   Body mass index is 38.12 kg/m.  Physical Exam Vitals and nursing note reviewed.  Constitutional:      General: She is not in acute distress.    Appearance: She is well-developed.  HENT:     Head: Normocephalic and atraumatic.     Mouth/Throat:     Mouth: Mucous membranes are moist.  Eyes:     Conjunctiva/sclera: Conjunctivae normal.  Cardiovascular:     Rate and Rhythm: Normal rate and  regular rhythm.     Pulses:          Dorsalis pedis pulses are 2+ on the right side and 2+ on the left side.       Posterior tibial pulses are 2+ on the right side and 2+ on the left side.     Heart sounds: No murmur heard.    Comments: Calves pain is not elicited with  palpation or foot dosiflexion. Pulmonary:     Effort: Pulmonary effort is normal. No respiratory distress.     Breath sounds: Normal breath sounds.  Abdominal:     Palpations: Abdomen is soft. There is no hepatomegaly or mass.     Tenderness: There is no abdominal tenderness.  Musculoskeletal:     Lumbar back: No tenderness or bony tenderness. Decreased range of motion. Negative right straight leg raise test and negative left straight leg raise test.     Comments: Pain elicited with movement on examination table. No edema,erythema,or suspicious lesion of skin on affected area. Can walk on heels but no on tip toes due to pain.  Lymphadenopathy:     Cervical: No cervical adenopathy.  Skin:    General: Skin is warm.     Findings: No erythema or rash.  Neurological:     General: No focal deficit present.     Mental Status: She is alert and oriented to person, place, and time.     Cranial Nerves: No cranial nerve deficit.     Deep Tendon Reflexes:     Reflex Scores:      Patellar reflexes are 2+ on the right side and 2+ on the left side.    Comments: Stable, antalgic gait, not assisted.  Psychiatric:        Mood and Affect: Mood is anxious.     Comments: Well groomed, good eye contact.   ASSESSMENT AND PLAN:  Kayla [name] was seen today for leg pain.  Diagnoses and all orders for this visit: Orders Placed This Encounter  Procedures   MICROSCOPIC MESSAGE   MR Lumbar Spine Wo Contrast   Urinalysis with Reflex Microscopic   Pain in both lower extremities We discussed possible etiologies, including radicular pain and vascular problems. Peripheral pulses are present,so for now we will hold on further work up. Will  treat as radicular pain given her hx of back pain. Prednisone taper recommended. We discussed some side effects of medications.Monitor BS's closely. Instructed to take prednisone with breakfast. Encouraged smoking cessation.  -     predniSONE (DELTASONE) 20 MG tablet; Take 3 tablets once daily for 3 days- 2 tabs for 3 days- 1 tab for 3 days- 1/2 tab for 3 days. Take each day with breakfast.  Chronic midline low back pain without sciatica Chronic and getting worse. Because now having LE edema and right foot numbness, lumbar MRI recommended. Wt loss will help. Short course of Celebrex, 7-10 days recommended. Side effects discussed. Instructed about warning signs.  -     celecoxib (CELEBREX) 100 MG capsule; Take 1 capsule (100 mg total) by mouth 2 (two) times daily for 10 days.  Numbness of right foot We discussed possible etiologies. Radicular,neuropathy.  Monitor for new symptoms. Appropriate foot care discussed. Prednisone may help. Instructed about warning signs.  Constipation, unspecified constipation type Increase fluid and fiber intake. Instructed about warning signs.  Return in about 2 weeks (around 04/09/2021), or if symptoms worsen or fail to improve.  Alicea Wente G. Martinique, MD  [facility]. [place] office.

## 2021-03-26 NOTE — Patient Instructions (Addendum)
A few things to remember from today's visit:   Pain in both lower extremities - Plan: MR Lumbar Spine Wo Contrast, predniSONE (DELTASONE) 20 MG tablet  Chronic midline low back pain without sciatica - Plan: Urinalysis, Routine w reflex microscopic, MR Lumbar Spine Wo Contrast, celecoxib (CELEBREX) 100 MG capsule  Numbness of right foot  If you need refills please call your pharmacy. Do not use My Chart to request refills or for acute issues that need immediate attention.   Continue appropriate foot care. Celebrex 2 times daily for 7-10 days. Prednisone with breakfast and not at the same time than Celebrex. Lumbar MRI will be arranged. Continue working on smoking cessation.  If symptoms suddenly worse, you need to seek immediate medical attention.  Please be sure medication list is accurate. If a new problem present, please set up appointment sooner than planned today.

## 2021-03-27 LAB — URINALYSIS, ROUTINE W REFLEX MICROSCOPIC
Bacteria, UA: NONE SEEN /HPF
Bilirubin Urine: NEGATIVE
Glucose, UA: NEGATIVE
Hgb urine dipstick: NEGATIVE
Leukocytes,Ua: NEGATIVE
Nitrite: NEGATIVE
Specific Gravity, Urine: 1.03 (ref 1.001–1.035)
pH: 6.5 (ref 5.0–8.0)

## 2021-03-28 ENCOUNTER — Encounter: Payer: Self-pay | Admitting: Family Medicine

## 2021-04-28 ENCOUNTER — Other Ambulatory Visit: Payer: Self-pay

## 2021-04-28 ENCOUNTER — Ambulatory Visit
Admission: RE | Admit: 2021-04-28 | Discharge: 2021-04-28 | Disposition: A | Discharge status: Home / Self Care | Payer: 59 | Source: Ambulatory Visit | Attending: Family Medicine | Admitting: Family Medicine

## 2021-04-28 DIAGNOSIS — G8929 Other chronic pain: Secondary | ICD-10-CM

## 2021-04-28 DIAGNOSIS — M48061 Spinal stenosis, lumbar region without neurogenic claudication: Secondary | ICD-10-CM | POA: Diagnosis not present

## 2021-04-28 DIAGNOSIS — M79605 Pain in left leg: Secondary | ICD-10-CM

## 2021-05-04 ENCOUNTER — Other Ambulatory Visit: Payer: Self-pay

## 2021-05-04 DIAGNOSIS — M5136 Other intervertebral disc degeneration, lumbar region: Secondary | ICD-10-CM

## 2021-05-18 ENCOUNTER — Ambulatory Visit: Payer: 59 | Admitting: Orthopaedic Surgery

## 2021-05-25 ENCOUNTER — Ambulatory Visit: Payer: 59 | Admitting: Orthopaedic Surgery

## 2021-05-25 DIAGNOSIS — J3489 Other specified disorders of nose and nasal sinuses: Secondary | ICD-10-CM | POA: Diagnosis not present

## 2021-05-25 DIAGNOSIS — Z9884 Bariatric surgery status: Secondary | ICD-10-CM | POA: Diagnosis not present

## 2021-05-25 DIAGNOSIS — K219 Gastro-esophageal reflux disease without esophagitis: Secondary | ICD-10-CM | POA: Diagnosis not present

## 2021-05-25 DIAGNOSIS — F17200 Nicotine dependence, unspecified, uncomplicated: Secondary | ICD-10-CM | POA: Diagnosis not present

## 2021-05-26 ENCOUNTER — Ambulatory Visit (INDEPENDENT_AMBULATORY_CARE_PROVIDER_SITE_OTHER): Payer: 59 | Admitting: Orthopaedic Surgery

## 2021-05-26 ENCOUNTER — Other Ambulatory Visit: Payer: Self-pay

## 2021-05-26 DIAGNOSIS — M47816 Spondylosis without myelopathy or radiculopathy, lumbar region: Secondary | ICD-10-CM | POA: Diagnosis not present

## 2021-05-26 DIAGNOSIS — M545 Low back pain, unspecified: Secondary | ICD-10-CM | POA: Diagnosis not present

## 2021-05-26 DIAGNOSIS — G8929 Other chronic pain: Secondary | ICD-10-CM

## 2021-05-26 NOTE — Progress Notes (Signed)
Office Visit Note   Patient: Kayla [name]           Date of Birth: [birth year]           MRN: [id number] Visit Date: 05/26/2021              Requested by: Martinique, Betty G, MD [address] [place],  [place] [address] PCP: Martinique, Betty G, MD   Assessment & Plan: Visit Diagnoses:  1. Chronic bilateral low back pain, unspecified whether sciatica present   2. Facet degeneration of lumbar region   3. deconditioning  Plan: Patient is lumbar facet degenerative changes L4-5 L5-S1 without central compression.  She is deconditioned with weak core muscles.  We will set up for some physical therapy.  We discussed that this will take a slow gradual process and she will need to participate in gym workout activities after therapy is finished to get gradual improvement.  Follow-Up Instructions: No follow-ups on file.   Orders:  Orders Placed This Encounter  Procedures   Ambulatory referral to Physical Therapy   No orders of the defined types were placed in this encounter.     Procedures: No procedures performed   Clinical Data: No additional findings.   Subjective: Chief Complaint  Patient presents with   Lower Back - Pain    NKI, onset x at least 1 year, legs bother her at night    HPI 51 year old female works in [employer], [employer] long hospital x4 years and is seen with new patient visit with chronic low back pain present for years.  Patient states her legs wake her up at night she has significant difficulty with walking.  She walks with a very slow short stride gait.  She states her legs are weak.  She has type 2 diabetes in remission A1c's are below 7 most recently was 6.2.  She has difficulty with stairs is unable to do a sit up.   history of morbid obesity, weight loss and last BMI 38.  Review of Systems all other systems are noncontributory to HPI.   Objective: Vital Signs: LMP 08/02/2013   Physical Exam Constitutional:      Appearance: She is  well-developed.  HENT:     Head: Normocephalic.     Right Ear: External ear normal.     Left Ear: External ear normal. There is no impacted cerumen.  Eyes:     Pupils: Pupils are equal, round, and reactive to light.  Neck:     Thyroid: No thyromegaly.     Trachea: No tracheal deviation.  Cardiovascular:     Rate and Rhythm: Normal rate.  Pulmonary:     Effort: Pulmonary effort is normal.  Abdominal:     Palpations: Abdomen is soft.  Musculoskeletal:     Cervical back: No rigidity.  Skin:    General: Skin is warm and dry.  Neurological:     Mental Status: She is alert and oriented to person, place, and time.  Psychiatric:        Behavior: Behavior normal.    Ortho Exam patient has some bilateral mild weakness in her quads hip flexors anterior tib gastrocsoleus.  Short stride gait slow deliberate.  Reflexes are intact and symmetrical.  No plantar foot lesions.  Specialty Comments:  No specialty comments available.  Imaging: Narrative & Impression  CLINICAL DATA:  Low back pain with bilateral leg pain for 3 months   EXAM: MRI LUMBAR SPINE WITHOUT CONTRAST   TECHNIQUE: Multiplanar,  multisequence MR imaging of the lumbar spine was performed. No intravenous contrast was administered.   COMPARISON:  None.   FINDINGS: Segmentation:  5 lumbar type vertebrae   Alignment: Prominent lumbar lordosis with borderline L4-5 anterolisthesis   Vertebrae: No fracture, evidence of discitis, or bone lesion. Mild marrow edema about the right L4-5 facet.   Conus medullaris and cauda equina: Conus extends to the L1 level. Conus and cauda equina appear normal.   Paraspinal and other soft tissues: Negative for perispinal mass or inflammation.   Disc levels:   T10-11 and T11-12 disc space narrowing with small central protrusions.   T12- L1: Unremarkable.   L1-L2: Unremarkable.   L2-L3: Mild disc narrowing and bulging   L3-L4: Disc space narrowing and bulging with small  central protrusion. Mild facet spurring   L4-L5: Disc narrowing and bulging. Moderate facet spurring. Mild triangular narrowing of the thecal sac.   L5-S1:Facet osteoarthritis with spurring. Disc space narrowing and endplate degeneration with bulging that is mild. Mild endplate spurring.   IMPRESSION: 1. Lumbar spine degeneration especially at L4-5 and L5-S1. Mild active facet arthritis on the right at L4-5. 2. No neural compression.     Electronically Signed   By: Jorje Guild M.D.   On: 04/29/2021 09:12       PMFS History: Patient Active Problem List   Diagnosis Date Noted   Facet degeneration of lumbar region 05/27/2021   Dyslipidemia (high LDL; low HDL) 12/04/2020   Insomnia 12/04/2020   Cigarette smoker 09/09/2020   Type 2 diabetes mellitus in remission ([place]) 12/03/2019   Essential hypertension 12/03/2019   Vitamin D deficiency, unspecified 12/03/2019   Eczema 12/03/2019   Other fatigue 12/12/2017   Shortness of breath on exertion 12/12/2017   Greater trochanteric bursitis of right hip 12/15/2015   Tobacco use disorder 11/07/2014   Morbid obesity ([place]) 11/07/2014   Past Medical History:  Diagnosis Date   Allergy    Anemia    Arthritis    lt knee   Back pain    Breast cyst, right    Constipation    Dyspnea    Fatigue    GERD (gastroesophageal reflux disease)    Hyperlipidemia    Hypertension    Joint pain    Leg edema    Obesity    Sleep apnea    lost weight 2013 gastric sleeve, no longer a problem   Sleep disturbance    SVD (spontaneous vaginal delivery)    x 3   Tobacco use     Family History  Problem Relation Age of Onset   Hypertension Mother    Colon polyps Mother    Hyperlipidemia Mother    Colon cancer Maternal Grandfather 82   Colon cancer Maternal Aunt        onset in 4s   Stomach cancer Maternal Aunt    Leukemia Paternal Grandmother    Colon polyps Maternal Uncle    Colon polyps Maternal Aunt     Past Surgical History:   Procedure Laterality Date   ABDOMINAL HYSTERECTOMY     BILATERAL SALPINGECTOMY Bilateral 09/27/2013   Procedure: BILATERAL SALPINGECTOMY;  Surgeon: Lahoma Crocker, MD;  Location: [facility];  Service: Gynecology;  Laterality: Bilateral;   BREAST CYST EXCISION Right 06/18/2020   Procedure: EXCISION RIGHT BREAST CHRONICALLY INFECTED SEBACEOUS CYST;  Surgeon: Coralie Keens, MD;  Location: [facility];  Service: General;  Laterality: Right;   CESAREAN SECTION     x 1   COLONOSCOPY  11/12/2020   2016   CYSTO WITH HYDRODISTENSION N/A 09/30/2019   Procedure: CYSTOSCOPY/HYDRODISTENSION;  Surgeon: Bjorn Loser, MD;  Location: [facility];  Service: Urology;  Laterality: N/A;  fulgeration   CYSTOSCOPY WITH BIOPSY N/A 09/30/2019   Procedure: CYSTOSCOPY WITH BLADDER BIOPSY;  Surgeon: Bjorn Loser, MD;  Location: [facility];  Service: Urology;  Laterality: N/A;   HAND FUSION     LAPAROTOMY Left 09/27/2013   Procedure: EXPLORATORY LAPAROTOMY W/ EXPLORATION OF LEFT ILLIAC ARTERY & VEIN;  Surgeon: Lahoma Crocker, MD;  Location: [facility];  Service: Gynecology;  Laterality: Left;   ROBOTIC ASSISTED TOTAL HYSTERECTOMY N/A 09/27/2013   Procedure: ROBOTIC ASSISTED TOTAL HYSTERECTOMY;  Surgeon: Lahoma Crocker, MD;  Location: [facility];  Service: Gynecology;  Laterality: N/A;   SLEEVE GASTROPLASTY  2013   in [place]   Social History   Occupational History   Not on file  Tobacco Use   Smoking status: Former    Packs/day: 1.00    Years: 25.00    Pack years: 25.00    Types: Cigarettes    Quit date: 08/27/2020    Years since quitting: 0.7   Smokeless tobacco: Current  Vaping Use   Vaping Use: Never used  Substance and Sexual Activity   Alcohol use: Yes    Alcohol/week: 0.0 standard drinks    Comment: socially   Drug use: No   Sexual activity: Yes    Partners: Male    Birth control/protection: Surgical

## 2021-05-27 DIAGNOSIS — M47816 Spondylosis without myelopathy or radiculopathy, lumbar region: Secondary | ICD-10-CM | POA: Insufficient documentation

## 2021-05-31 ENCOUNTER — Other Ambulatory Visit: Payer: Self-pay

## 2021-05-31 ENCOUNTER — Encounter: Payer: Self-pay | Admitting: Physical Therapy

## 2021-05-31 ENCOUNTER — Other Ambulatory Visit (HOSPITAL_COMMUNITY): Payer: Self-pay

## 2021-05-31 ENCOUNTER — Ambulatory Visit (INDEPENDENT_AMBULATORY_CARE_PROVIDER_SITE_OTHER): Payer: 59 | Admitting: Physical Therapy

## 2021-05-31 DIAGNOSIS — R293 Abnormal posture: Secondary | ICD-10-CM | POA: Diagnosis not present

## 2021-05-31 DIAGNOSIS — R2689 Other abnormalities of gait and mobility: Secondary | ICD-10-CM | POA: Diagnosis not present

## 2021-05-31 DIAGNOSIS — M545 Low back pain, unspecified: Secondary | ICD-10-CM | POA: Diagnosis not present

## 2021-05-31 DIAGNOSIS — G8929 Other chronic pain: Secondary | ICD-10-CM | POA: Diagnosis not present

## 2021-05-31 DIAGNOSIS — M6281 Muscle weakness (generalized): Secondary | ICD-10-CM | POA: Diagnosis not present

## 2021-05-31 NOTE — Therapy (Signed)
OUTPATIENT PHYSICAL THERAPY THORACOLUMBAR EVALUATION   Patient Name: Kayla [name] MRN: [id number] DOB:12/05/1970, 51 y.o., female Today's Date: 05/31/2021   PT End of Session - 05/31/21 1050     Visit Number 1    Number of Visits 6    Date for PT Re-Evaluation 07/12/21    Authorization Type [facility]    PT Start Time 1016    PT Stop Time 1050    PT Time Calculation (min) 34 min    Activity Tolerance Patient tolerated treatment well    Behavior During Therapy WFL for tasks assessed/performed             Past Medical History:  Diagnosis Date   Allergy    Anemia    Arthritis    lt knee   Back pain    Breast cyst, right    Constipation    Dyspnea    Fatigue    GERD (gastroesophageal reflux disease)    Hyperlipidemia    Hypertension    Joint pain    Leg edema    Obesity    Sleep apnea    lost weight 2013 gastric sleeve, no longer a problem   Sleep disturbance    SVD (spontaneous vaginal delivery)    x 3   Tobacco use    Past Surgical History:  Procedure Laterality Date   ABDOMINAL HYSTERECTOMY     BILATERAL SALPINGECTOMY Bilateral 09/27/2013   Procedure: BILATERAL SALPINGECTOMY;  Surgeon: Lahoma Crocker, MD;  Location: [facility];  Service: Gynecology;  Laterality: Bilateral;   BREAST CYST EXCISION Right 06/18/2020   Procedure: EXCISION RIGHT BREAST CHRONICALLY INFECTED SEBACEOUS CYST;  Surgeon: Coralie Keens, MD;  Location: [facility];  Service: General;  Laterality: Right;   CESAREAN SECTION     x 1   COLONOSCOPY  11/12/2020   2016   CYSTO WITH HYDRODISTENSION N/A 09/30/2019   Procedure: CYSTOSCOPY/HYDRODISTENSION;  Surgeon: Bjorn Loser, MD;  Location: [facility];  Service: Urology;  Laterality: N/A;  fulgeration   CYSTOSCOPY WITH BIOPSY N/A 09/30/2019   Procedure: CYSTOSCOPY WITH BLADDER BIOPSY;  Surgeon: Bjorn Loser, MD;  Location: [facility];  Service: Urology;  Laterality: N/A;   HAND FUSION     LAPAROTOMY Left 09/27/2013    Procedure: EXPLORATORY LAPAROTOMY W/ EXPLORATION OF LEFT ILLIAC ARTERY & VEIN;  Surgeon: Lahoma Crocker, MD;  Location: [facility];  Service: Gynecology;  Laterality: Left;   ROBOTIC ASSISTED TOTAL HYSTERECTOMY N/A 09/27/2013   Procedure: ROBOTIC ASSISTED TOTAL HYSTERECTOMY;  Surgeon: Lahoma Crocker, MD;  Location: [facility];  Service: Gynecology;  Laterality: N/A;   SLEEVE GASTROPLASTY  2013   in [place]   Patient Active Problem List   Diagnosis Date Noted   Facet degeneration of lumbar region 05/27/2021   Dyslipidemia (high LDL; low HDL) 12/04/2020   Insomnia 12/04/2020   Cigarette smoker 09/09/2020   Type 2 diabetes mellitus in remission ([place]) 12/03/2019   Essential hypertension 12/03/2019   Vitamin D deficiency, unspecified 12/03/2019   Eczema 12/03/2019   Other fatigue 12/12/2017   Shortness of breath on exertion 12/12/2017   Greater trochanteric bursitis of right hip 12/15/2015   Tobacco use disorder 11/07/2014   Morbid obesity ([place]) 11/07/2014    PCP: Martinique, Betty G, MD  REFERRING PROVIDER: Marybelle Killings, MD  REFERRING DIAG: M54.50,G89.29 (ICD-10-CM) - Chronic bilateral low back pain, unspecified whether sciatica present  THERAPY DIAG:  Chronic midline low back pain without sciatica - Plan: PT plan of care cert/re-cert  Muscle  weakness (generalized) - Plan: PT plan of care cert/re-cert  Other abnormalities of gait and mobility - Plan: PT plan of care cert/re-cert  Abnormal posture - Plan: PT plan of care cert/re-cert  ONSET DATE: chronic x many years  SUBJECTIVE:                                                                                                                                                                                           SUBJECTIVE STATEMENT: Pt is a 51 y/o female who presents to OPPT for chronic LBP for many years.  She denies any specific injury.  She also reports recent exacerbation and MD recommending PT.  PERTINENT HISTORY:  OA, HTN,  obesity s/p gastric sleeve (15 years ago), smoker  PAIN:  Are you having pain? No NPRS scale: 0/10, up to 9/10 Pain location: back Pain orientation: Medial and Lower  PAIN TYPE: sharp Pain description: sharp  Aggravating factors: getting up in the morning, standing, lifting heavier pans from work Relieving factors: sleeping on Rt side with pillow between legs  PRECAUTIONS: None  WEIGHT BEARING RESTRICTIONS No  FALLS:  Has patient fallen in last 6 months? 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   OBJECTIVE:   DIAGNOSTIC FINDINGS:  05/31/21: Xrays/MRI: OA, no compression  PATIENT SURVEYS:  05/31/21: FOTO 63 (predicted 59)  SCREENING FOR RED FLAGS: Bowel or bladder incontinence: No Spinal tumors: No Cauda equina syndrome: No Compression fracture: No Abdominal aneurysm: No  COGNITION:  Overall cognitive status: Within functional limits for tasks assessed     SENSATION:  Light touch: Appears intact  MUSCLE LENGTH: Hamstrings: tightness bil  POSTURE:  Rounded shoulders, forward head  PALPATION: Deferred today; pt with difficulty lying prone  LUMBAR AROM/PROM  A/PROM A/PROM  05/31/2021  Flexion A limited 25%  Extension WNL (directional preference)  Right lateral flexion Limited 25% (Rt side pain)  Left lateral flexion Limited 25% (Lt side pain)  Right rotation WNL  Left rotation WNL   (Blank rows = not tested)   LE MMT:  MMT Right 05/31/2021 Left 05/31/2021  Hip flexion 3/5 3+/5  Hip extension    Hip abduction    Hip adduction     Hip  internal rotation    Hip external rotation    Knee flexion 4/5 4/5  Knee extension 3+/5 3+/5  Ankle dorsiflexion 4/5 4/5  Ankle plantarflexion    Ankle inversion    Ankle eversion     (Blank rows = not tested)  05/31/21: giveway weakness noted with all testing; suspect hip ext weakness due to decreased step length and difficulty rising from chair without UE support   LUMBAR SPECIAL TESTS:  Slump test: Positive  slight decrease in symptoms with reduction in neural tension on Rt (at neck); Lt no change  FUNCTIONAL TESTS:  deferred  GAIT: Distance walked: 100 Assistive device utilized: None Level of assistance: Complete Independence Comments: decreased step length bil    TODAY'S TREATMENT  See HEP, performed 1-5 trial reps of each exercise for understanding   PATIENT EDUCATION:  Education details: HEP, TENS Person educated: Patient Education method: Explanation, Demonstration, and Handouts Education comprehension: verbalized understanding, returned demonstration, and needs further education   HOME EXERCISE PROGRAM: Access Code: [email or web address] URL: [email or web address] Date: 05/31/2021 Prepared by: Faustino Congress  Exercises Hooklying Single Knee to Chest Stretch with Towel - 2 x daily - 7 x weekly - 1 sets - 3 reps - 20-30 sec hold Supine Piriformis Stretch with Towel - 2 x daily - 7 x weekly - 1 sets - 3 reps - 20-30 sec hold Standing Lumbar Extension at Wall - Forearms - 2-3 x daily - 7 x weekly - 1 sets - 10 reps - 10 sec hold Seated Scapular Retraction - 2 x daily - 7 x weekly - 1 sets - 10 reps - 5 sec hold  Patient Education TENS Unit  ASSESSMENT:  CLINICAL IMPRESSION: Patient is a 51 y.o. female who was seen today for physical therapy evaluation and treatment for chronic LBP. Objective impairments include Abnormal gait, decreased activity tolerance, decreased endurance, decreased ROM, decreased strength, increased fascial restrictions,  impaired flexibility, improper body mechanics, postural dysfunction, and pain. These impairments are limiting patient from cleaning, community activity, and occupation. Personal factors including Time since onset of injury/illness/exacerbation and 3+ comorbidities: OA, HTN, obesity s/p gastric sleeve, smoker  are also affecting patient's functional outcome. Patient will benefit from skilled PT to address above impairments and improve overall function.  REHAB POTENTIAL: Good  CLINICAL DECISION MAKING: Evolving/moderate complexity  EVALUATION COMPLEXITY: Moderate   GOALS: Goals reviewed with patient? Yes  SHORT TERM GOALS:  STG Name Target Date Goal status  1 Independent with initial HEP Baseline:  06/21/2021 INITIAL                                 LONG TERM GOALS:   LTG Name Target Date Goal status  1 Independent with final HEP Baseline: 07/12/2021 INITIAL  2 FOTO score improved to 68 for improved function Baseline: 07/12/2021 INITIAL  3 Report pain < 5/10 with full work day for improved function Baseline: 07/12/2021 INITIAL  4 Demonstrate 4/5 bil LE strength for improved function Baseline: 07/12/2021 INITIAL                  PLAN: PT FREQUENCY: 1x/week  PT DURATION: 6 weeks  PLANNED INTERVENTIONS: Therapeutic exercises, Therapeutic activity, Neuro Muscular re-education, Balance training, Gait training, Patient/Family education, Joint mobilization, Stair training, Dry Needling, Electrical stimulation, Spinal mobilization, Cryotherapy, Moist heat, Taping, Traction, and Manual therapy  PLAN FOR NEXT SESSION: review HEP, trial of estim for home TENS unit, continue with flexibility  and general LE strengthening/conditioning    Laureen Abrahams, PT, DPT 05/31/21 11:13 AM

## 2021-06-04 ENCOUNTER — Other Ambulatory Visit: Payer: Self-pay | Admitting: Surgery

## 2021-06-04 DIAGNOSIS — N6001 Solitary cyst of right breast: Secondary | ICD-10-CM | POA: Diagnosis not present

## 2021-06-09 ENCOUNTER — Other Ambulatory Visit: Payer: Self-pay | Admitting: Surgery

## 2021-06-09 ENCOUNTER — Encounter: Payer: Self-pay | Admitting: Physical Therapy

## 2021-06-09 ENCOUNTER — Other Ambulatory Visit: Payer: Self-pay

## 2021-06-09 ENCOUNTER — Ambulatory Visit (INDEPENDENT_AMBULATORY_CARE_PROVIDER_SITE_OTHER): Payer: 59 | Admitting: Physical Therapy

## 2021-06-09 DIAGNOSIS — R2689 Other abnormalities of gait and mobility: Secondary | ICD-10-CM

## 2021-06-09 DIAGNOSIS — G8929 Other chronic pain: Secondary | ICD-10-CM | POA: Diagnosis not present

## 2021-06-09 DIAGNOSIS — M6281 Muscle weakness (generalized): Secondary | ICD-10-CM

## 2021-06-09 DIAGNOSIS — R293 Abnormal posture: Secondary | ICD-10-CM | POA: Diagnosis not present

## 2021-06-09 DIAGNOSIS — M545 Low back pain, unspecified: Secondary | ICD-10-CM | POA: Diagnosis not present

## 2021-06-09 DIAGNOSIS — N6001 Solitary cyst of right breast: Secondary | ICD-10-CM

## 2021-06-09 NOTE — Therapy (Signed)
OUTPATIENT PHYSICAL THERAPY TREATMENT NOTE   Patient Name: Kayla [name] MRN: [id number] DOB:1970/06/27, 51 y.o., female Today's Date: 06/09/2021  PCP: Martinique, Betty G, MD REFERRING PROVIDER: Marybelle Killings, MD   PT End of Session - 06/09/21 1143     Visit Number 2    Number of Visits 6    Date for PT Re-Evaluation 07/12/21    Authorization Type [facility]    PT Start Time 1108    PT Stop Time 1155    PT Time Calculation (min) 47 min    Activity Tolerance Patient tolerated treatment well    Behavior During Therapy [facility] for tasks assessed/performed             Past Medical History:  Diagnosis Date   Allergy    Anemia    Arthritis    lt knee   Back pain    Breast cyst, right    Constipation    Dyspnea    Fatigue    GERD (gastroesophageal reflux disease)    Hyperlipidemia    Hypertension    Joint pain    Leg edema    Obesity    Sleep apnea    lost weight 2013 gastric sleeve, no longer a problem   Sleep disturbance    SVD (spontaneous vaginal delivery)    x 3   Tobacco use    Past Surgical History:  Procedure Laterality Date   ABDOMINAL HYSTERECTOMY     BILATERAL SALPINGECTOMY Bilateral 09/27/2013   Procedure: BILATERAL SALPINGECTOMY;  Surgeon: Lahoma Crocker, MD;  Location: [facility];  Service: Gynecology;  Laterality: Bilateral;   BREAST CYST EXCISION Right 06/18/2020   Procedure: EXCISION RIGHT BREAST CHRONICALLY INFECTED SEBACEOUS CYST;  Surgeon: Coralie Keens, MD;  Location: [facility];  Service: General;  Laterality: Right;   CESAREAN SECTION     x 1   COLONOSCOPY  11/12/2020   2016   CYSTO WITH HYDRODISTENSION N/A 09/30/2019   Procedure: CYSTOSCOPY/HYDRODISTENSION;  Surgeon: Bjorn Loser, MD;  Location: [facility];  Service: Urology;  Laterality: N/A;  fulgeration   CYSTOSCOPY WITH BIOPSY N/A 09/30/2019   Procedure: CYSTOSCOPY WITH BLADDER BIOPSY;  Surgeon: Bjorn Loser, MD;  Location: [facility];  Service: Urology;   Laterality: N/A;   HAND FUSION     LAPAROTOMY Left 09/27/2013   Procedure: EXPLORATORY LAPAROTOMY W/ EXPLORATION OF LEFT ILLIAC ARTERY & VEIN;  Surgeon: Lahoma Crocker, MD;  Location: [facility];  Service: Gynecology;  Laterality: Left;   ROBOTIC ASSISTED TOTAL HYSTERECTOMY N/A 09/27/2013   Procedure: ROBOTIC ASSISTED TOTAL HYSTERECTOMY;  Surgeon: Lahoma Crocker, MD;  Location: [facility];  Service: Gynecology;  Laterality: N/A;   SLEEVE GASTROPLASTY  2013   in [place]   Patient Active Problem List   Diagnosis Date Noted   Facet degeneration of lumbar region 05/27/2021   Dyslipidemia (high LDL; low HDL) 12/04/2020   Insomnia 12/04/2020   Cigarette smoker 09/09/2020   Type 2 diabetes mellitus in remission ([place]) 12/03/2019   Essential hypertension 12/03/2019   Vitamin D deficiency, unspecified 12/03/2019   Eczema 12/03/2019   Other fatigue 12/12/2017   Shortness of breath on exertion 12/12/2017   Greater trochanteric bursitis of right hip 12/15/2015   Tobacco use disorder 11/07/2014   Morbid obesity ([place]) 11/07/2014    REFERRING DIAG: M54.50,G89.29 (ICD-10-CM) - Chronic bilateral low back pain, unspecified whether sciatica present   THERAPY DIAG:  Chronic midline low back pain without sciatica  Muscle weakness (generalized)  Other abnormalities of gait  and mobility  Abnormal posture  PERTINENT HISTORY: Allergy Anemia Arthritislt knee Back pain Breast cyst, right Constipation Dyspnea Fatigue GERD (gastroesophageal reflux disease) Hyperlipidemia Hypertension Joint pain Leg edema Obesity Sleep apnea   PRECAUTIONS: none  SUBJECTIVE: Pt arriving today reporting 7-8/10 low back pain and bilateral knee pain. Pt stating she didn't get a chance to work on the stretches provided at her initial evaluation.  PAIN:  06/09/2021 Are you having pain? Yes NPRS scale: 7/10 Pain location: low back and bilateral knees Pain orientation: Bilateral  PAIN TYPE: aching and throbbing Pain description:  constant  Aggravating factors: movements Relieving factors: changing positions  Today's treatment 06/09/21 Therapeutic Exercise:  Aerobic: bike: L3 x 6 minutes Supine: trunk rotation x 3 each side holding 30 seconds              Piriformis stretch x 3 each side holding 20 seconds               Supine marching x 20 alternating each LE with instructions for core activation Prone:  Seated: Hamstring stretch x 2 each LE holding 30 seconds  Neuromuscular Re-education: Manual Therapy: Therapeutic Activity: Self Care: Trigger Point Dry Needling:  Modalities:  E-stim: Pre moderate 15 minutes intensity to tolerance to lumbar paraspinals Moist Heat: x 15 minutes with E-stim to low back     LUMBAR AROM/PROM   A/PROM A/PROM  05/31/2021  Flexion A limited 25%  Extension WNL (directional preference)  Right lateral flexion Limited 25% (Rt side pain)  Left lateral flexion Limited 25% (Lt side pain)  Right rotation WNL  Left rotation WNL   (Blank rows = not tested)     LE MMT:   MMT Right 05/31/2021 Left 05/31/2021  Hip flexion 3/5 3+/5  Hip extension      Hip abduction      Hip adduction      Hip internal rotation      Hip external rotation      Knee flexion 4/5 4/5  Knee extension 3+/5 3+/5  Ankle dorsiflexion 4/5 4/5  Ankle plantarflexion      Ankle inversion      Ankle eversion       (Blank rows = not tested)   05/31/21: giveway weakness noted with all testing; suspect hip ext weakness due to decreased step length and difficulty rising from chair without UE support     LUMBAR SPECIAL TESTS:  Slump test: Positive  slight decrease in symptoms with reduction in neural tension on Rt (at neck); Lt no change      GAIT: Distance walked: 100 Assistive device utilized: None Level of assistance: Complete Independence Comments: decreased step length bil            PATIENT EDUCATION:  Education details:reviewed HEP, TENS Person educated: Patient Education method:  Explanation, Demonstration, and Handouts Education comprehension: verbalized understanding, returned demonstration, and needs further education     HOME EXERCISE PROGRAM: Access Code: [email or web address] URL: [email or web address] Date: 05/31/2021 Prepared by: Faustino Congress   Exercises Hooklying Single Knee to Chest Stretch with Towel - 2 x daily - 7 x weekly - 1 sets - 3 reps - 20-30 sec hold Supine Piriformis Stretch with Towel - 2 x daily - 7 x weekly - 1 sets - 3 reps - 20-30 sec hold Standing Lumbar Extension at Wall - Forearms - 2-3 x daily - 7 x weekly - 1 sets - 10 reps - 10 sec hold Seated Scapular Retraction - 2 x daily - 7  x weekly - 1 sets - 10 reps - 5 sec hold   Patient Education TENS Unit for home purchase and use   ASSESSMENT:   CLINICAL IMPRESSION: Pt arriving today reporting 7/10 low back pain and bilateral knee pain. Pt amb with antalgic gait pattern. Pt  with limited tolerance to exercises due to increased pain today. Pt with good response to E-stim treatment to lumbar spine. Continue skilled Pt to maximize pt's function and progress toward LTG's.    Objective impairments include Abnormal gait, decreased activity tolerance, decreased endurance, decreased ROM, decreased strength, increased fascial restrictions, impaired flexibility, improper body mechanics, postural dysfunction, and pain. These impairments are limiting patient from cleaning, community activity, and occupation. Personal factors including Time since onset of injury/illness/exacerbation and 3+ comorbidities: OA, HTN, obesity s/p gastric sleeve, smoker  are also affecting patient's functional outcome. Patient will benefit from skilled PT to address above impairments and improve overall function.   REHAB POTENTIAL: Good        GOALS: Goals reviewed with patient? Yes   SHORT TERM GOALS:   STG Name Target Date Goal status  1 Independent with initial HEP Baseline:  06/21/2021 INITIAL                                                           LONG TERM GOALS:    LTG Name Target Date Goal status  1 Independent with final HEP Baseline: 07/12/2021 INITIAL  2 FOTO score improved to 68 for improved function Baseline: 07/12/2021 INITIAL  3 Report pain < 5/10 with full work day for improved function Baseline: 07/12/2021 INITIAL  4 Demonstrate 4/5 bil LE strength for improved function Baseline: 07/12/2021 INITIAL                               PLAN: PT FREQUENCY: 1x/week   PT DURATION: 6 weeks   PLANNED INTERVENTIONS: Therapeutic exercises, Therapeutic activity, Neuro Muscular re-education, Balance training, Gait training, Patient/Family education, Joint mobilization, Stair training, Dry Needling, Electrical stimulation, Spinal mobilization, Cryotherapy, Moist heat, Taping, Traction, and Manual therapy   PLAN FOR NEXT SESSION: lumbar stretching, core activation, assess E-stim tolerance             Oretha Caprice, PT, MPT 06/09/2021, 11:52 AM

## 2021-06-11 ENCOUNTER — Ambulatory Visit
Admission: RE | Admit: 2021-06-11 | Discharge: 2021-06-11 | Disposition: A | Discharge status: Home / Self Care | Payer: 59 | Source: Ambulatory Visit | Attending: Surgery | Admitting: Surgery

## 2021-06-11 DIAGNOSIS — N6001 Solitary cyst of right breast: Secondary | ICD-10-CM

## 2021-06-11 DIAGNOSIS — R928 Other abnormal and inconclusive findings on diagnostic imaging of breast: Secondary | ICD-10-CM | POA: Diagnosis not present

## 2021-06-15 ENCOUNTER — Encounter: Payer: 59 | Admitting: Physical Therapy

## 2021-06-15 NOTE — Therapy (Incomplete)
OUTPATIENT PHYSICAL THERAPY TREATMENT NOTE   Patient Name: Kayla [name] MRN: [id number] DOB:1971-04-06, 51 y.o., female Today's Date: 06/15/2021  PCP: Martinique, Betty G, MD REFERRING PROVIDER: Marybelle Killings, MD     Past Medical History:  Diagnosis Date   Allergy    Anemia    Arthritis    lt knee   Back pain    Breast cyst, right    Constipation    Dyspnea    Fatigue    GERD (gastroesophageal reflux disease)    Hyperlipidemia    Hypertension    Joint pain    Leg edema    Obesity    Sleep apnea    lost weight 2013 gastric sleeve, no longer a problem   Sleep disturbance    SVD (spontaneous vaginal delivery)    x 3   Tobacco use    Past Surgical History:  Procedure Laterality Date   ABDOMINAL HYSTERECTOMY     BILATERAL SALPINGECTOMY Bilateral 09/27/2013   Procedure: BILATERAL SALPINGECTOMY;  Surgeon: Lahoma Crocker, MD;  Location: [facility];  Service: Gynecology;  Laterality: Bilateral;   BREAST BIOPSY Right 06/18/2020   BREAST CYST EXCISION Right 06/18/2020   Procedure: EXCISION RIGHT BREAST CHRONICALLY INFECTED SEBACEOUS CYST;  Surgeon: Coralie Keens, MD;  Location: [facility];  Service: General;  Laterality: Right;   CESAREAN SECTION     x 1   COLONOSCOPY  11/12/2020   2016   CYSTO WITH HYDRODISTENSION N/A 09/30/2019   Procedure: CYSTOSCOPY/HYDRODISTENSION;  Surgeon: Bjorn Loser, MD;  Location: [facility];  Service: Urology;  Laterality: N/A;  fulgeration   CYSTOSCOPY WITH BIOPSY N/A 09/30/2019   Procedure: CYSTOSCOPY WITH BLADDER BIOPSY;  Surgeon: Bjorn Loser, MD;  Location: [facility];  Service: Urology;  Laterality: N/A;   HAND FUSION     LAPAROTOMY Left 09/27/2013   Procedure: EXPLORATORY LAPAROTOMY W/ EXPLORATION OF LEFT ILLIAC ARTERY & VEIN;  Surgeon: Lahoma Crocker, MD;  Location: [facility];  Service: Gynecology;  Laterality: Left;   ROBOTIC ASSISTED TOTAL HYSTERECTOMY N/A 09/27/2013   Procedure: ROBOTIC ASSISTED TOTAL  HYSTERECTOMY;  Surgeon: Lahoma Crocker, MD;  Location: [facility];  Service: Gynecology;  Laterality: N/A;   SLEEVE GASTROPLASTY  2013   in [place]   Patient Active Problem List   Diagnosis Date Noted   Facet degeneration of lumbar region 05/27/2021   Dyslipidemia (high LDL; low HDL) 12/04/2020   Insomnia 12/04/2020   Cigarette smoker 09/09/2020   Type 2 diabetes mellitus in remission ([place]) 12/03/2019   Essential hypertension 12/03/2019   Vitamin D deficiency, unspecified 12/03/2019   Eczema 12/03/2019   Other fatigue 12/12/2017   Shortness of breath on exertion 12/12/2017   Greater trochanteric bursitis of right hip 12/15/2015   Tobacco use disorder 11/07/2014   Morbid obesity ([place]) 11/07/2014    REFERRING DIAG: M54.50,G89.29 (ICD-10-CM) - Chronic bilateral low back pain, unspecified whether sciatica present   THERAPY DIAG:  No diagnosis found.  PERTINENT HISTORY: Allergy Anemia Arthritislt knee Back pain Breast cyst, right Constipation Dyspnea Fatigue GERD (gastroesophageal reflux disease) Hyperlipidemia Hypertension Joint pain Leg edema Obesity Sleep apnea   PRECAUTIONS: none  SUBJECTIVE: ***  PAIN:  06/09/2021 Are you having pain? Yes NPRS scale: 7/10 Pain location: low back and bilateral knees Pain orientation: Bilateral  PAIN TYPE: aching and throbbing Pain description: constant  Aggravating factors: movements Relieving factors: changing positions  Today's treatment  06/15/21 Therapeutic Exercise:  Aerobic: Supine: Prone:  Seated:  Standing: Neuromuscular Re-education: Manual Therapy: Therapeutic Activity:  Self Care: Trigger Point Dry Needling:  Modalities:       06/09/21 Therapeutic Exercise:  Aerobic: bike: L3 x 6 minutes Supine: trunk rotation x 3 each side holding 30 seconds              Piriformis stretch x 3 each side holding 20 seconds               Supine marching x 20 alternating each LE with instructions for core activation Prone:  Seated:  Hamstring stretch x 2 each LE holding 30 seconds  Neuromuscular Re-education: Manual Therapy: Therapeutic Activity: Self Care: Trigger Point Dry Needling:  Modalities:  E-stim: Pre moderate 15 minutes intensity to tolerance to lumbar paraspinals Moist Heat: x 15 minutes with E-stim to low back     LUMBAR AROM/PROM   A/PROM A/PROM  05/31/2021  Flexion A limited 25%  Extension WNL (directional preference)  Right lateral flexion Limited 25% (Rt side pain)  Left lateral flexion Limited 25% (Lt side pain)  Right rotation WNL  Left rotation WNL   (Blank rows = not tested)     LE MMT:   MMT Right 05/31/2021 Left 05/31/2021  Hip flexion 3/5 3+/5  Hip extension      Hip abduction      Hip adduction      Hip internal rotation      Hip external rotation      Knee flexion 4/5 4/5  Knee extension 3+/5 3+/5  Ankle dorsiflexion 4/5 4/5  Ankle plantarflexion      Ankle inversion      Ankle eversion       (Blank rows = not tested)   05/31/21: giveway weakness noted with all testing; suspect hip ext weakness due to decreased step length and difficulty rising from chair without UE support     LUMBAR SPECIAL TESTS:  05/31/21: Slump test: Positive  slight decrease in symptoms with reduction in neural tension on Rt (at neck); Lt no change     PATIENT EDUCATION:  Education details:reviewed HEP, TENS Person educated: Patient Education method: Explanation, Demonstration, and Handouts Education comprehension: verbalized understanding, returned demonstration, and needs further education     HOME EXERCISE PROGRAM: Access Code: [email or web address] URL: [email or web address] Date: 05/31/2021 Prepared by: Faustino Congress   Exercises Hooklying Single Knee to Chest Stretch with Towel - 2 x daily - 7 x weekly - 1 sets - 3 reps - 20-30 sec hold Supine Piriformis Stretch with Towel - 2 x daily - 7 x weekly - 1 sets - 3 reps - 20-30 sec hold Standing Lumbar Extension at Wall - Forearms  - 2-3 x daily - 7 x weekly - 1 sets - 10 reps - 10 sec hold Seated Scapular Retraction - 2 x daily - 7 x weekly - 1 sets - 10 reps - 5 sec hold   Patient Education TENS Unit for home purchase and use   ASSESSMENT:   CLINICAL IMPRESSION: ***  Pt arriving today reporting 7/10 low back pain and bilateral knee pain. Pt amb with antalgic gait pattern. Pt  with limited tolerance to exercises due to increased pain today. Pt with good response to E-stim treatment to lumbar spine. Continue skilled Pt to maximize pt's function and progress toward LTG's.    Objective impairments include Abnormal gait, decreased activity tolerance, decreased endurance, decreased ROM, decreased strength, increased fascial restrictions, impaired flexibility, improper body mechanics, postural dysfunction, and pain. These impairments are limiting patient from cleaning, community activity, and occupation. Personal factors  including Time since onset of injury/illness/exacerbation and 3+ comorbidities: OA, HTN, obesity s/p gastric sleeve, smoker  are also affecting patient's functional outcome. Patient will benefit from skilled PT to address above impairments and improve overall function.   REHAB POTENTIAL: Good        GOALS: Goals reviewed with patient? Yes   SHORT TERM GOALS:   STG Name Target Date Goal status  1 Independent with initial HEP Baseline:  06/21/2021 INITIAL                                                          LONG TERM GOALS:    LTG Name Target Date Goal status  1 Independent with final HEP Baseline: 07/12/2021 INITIAL  2 FOTO score improved to 68 for improved function Baseline: 07/12/2021 INITIAL  3 Report pain < 5/10 with full work day for improved function Baseline: 07/12/2021 INITIAL  4 Demonstrate 4/5 bil LE strength for improved function Baseline: 07/12/2021 INITIAL                               PLAN: PT FREQUENCY: 1x/week   PT DURATION: 6 weeks   PLANNED INTERVENTIONS:  Therapeutic exercises, Therapeutic activity, Neuro Muscular re-education, Balance training, Gait training, Patient/Family education, Joint mobilization, Stair training, Dry Needling, Electrical stimulation, Spinal mobilization, Cryotherapy, Moist heat, Taping, Traction, and Manual therapy   PLAN FOR NEXT SESSION:  *** lumbar stretching, core activation, assess E-stim tolerance             Faustino Congress, PT, MPT 06/15/2021, 7:36 AM

## 2021-06-17 ENCOUNTER — Emergency Department (HOSPITAL_COMMUNITY)
Admission: EM | Admit: 2021-06-17 | Discharge: 2021-06-17 | Disposition: A | Discharge status: Home / Self Care | Payer: 59 | Attending: Emergency Medicine | Admitting: Emergency Medicine

## 2021-06-17 ENCOUNTER — Encounter (HOSPITAL_COMMUNITY): Payer: Self-pay | Admitting: *Deleted

## 2021-06-17 ENCOUNTER — Other Ambulatory Visit: Payer: Self-pay

## 2021-06-17 ENCOUNTER — Emergency Department (HOSPITAL_COMMUNITY): Payer: 59

## 2021-06-17 DIAGNOSIS — Z794 Long term (current) use of insulin: Secondary | ICD-10-CM | POA: Insufficient documentation

## 2021-06-17 DIAGNOSIS — M25561 Pain in right knee: Secondary | ICD-10-CM | POA: Diagnosis not present

## 2021-06-17 DIAGNOSIS — M7989 Other specified soft tissue disorders: Secondary | ICD-10-CM | POA: Diagnosis not present

## 2021-06-17 MED ORDER — OXYCODONE-ACETAMINOPHEN 5-325 MG PO TABS
1.0000 | ORAL_TABLET | Freq: Four times a day (QID) | ORAL | 0 refills | Status: DC | PRN
  Filled 2021-06-17: qty 10, 3d supply, fill #0

## 2021-06-17 MED ORDER — KETOROLAC TROMETHAMINE 15 MG/ML IJ SOLN
15.0000 mg | Freq: Once | INTRAMUSCULAR | Status: AC
  Administered 2021-06-17: 15 mg via INTRAMUSCULAR
  Filled 2021-06-17: qty 1

## 2021-06-17 NOTE — ED Provider Triage Note (Signed)
Emergency Medicine Provider Triage Evaluation Note  Kayla [name] , a 51 y.o. female  was evaluated in triage.  Pt complains of right knee pain of the past 3-day duration.  Reports associated swelling that has been waxing and waning.  Denies fever, chills, inability to bear weight.  Patient reports she presented to work today and was told to come to the emergency room for evaluation because she was in significant pain.  States she has degenerative joint disease in her left knee and she was advised to get injections.  She has not had this done yet..  Review of Systems  Positive: As above Negative: As above  Physical Exam  BP (!) 131/98 (BP Location: Left Arm)    Pulse (!) 106    Temp 98.9 F (37.2 C) (Oral)    Resp 19    Ht 5\' 6"  (1.676 m)    Wt 113.4 kg    LMP 08/02/2013    SpO2 99%    BMI 40.35 kg/m  Gen:   Awake, no distress   Resp:  Normal effort  MSK:   Moves extremities without difficulty Other:    Medical Decision Making  Medically screening exam initiated at 9:40 PM.  Appropriate orders placed.  [name] was informed that the remainder of the evaluation will be completed by another provider, this initial triage assessment does not replace that evaluation, and the importance of remaining in the ED until their evaluation is complete.     Evlyn Courier, PA-C 06/17/21 2141

## 2021-06-17 NOTE — ED Provider Notes (Signed)
[facility] DEPT Provider Note   CSN: [id number] Arrival date & time: 06/17/21  2059     History  Chief Complaint  Patient presents with   Knee Pain    Kayla [name] is a 51 y.o. female.  Kayla [name] , a 51 y.o. female  was evaluated in triage.  Pt complains of right knee pain of the past 3-day duration.  Reports associated swelling that has been waxing and waning.  Denies fever, chills, inability to bear weight.  Patient reports she presented to work today and was told to come to the emergency room for evaluation because she was in significant pain.  States she has degenerative joint disease in her left knee and she was advised to get injections.  She has not had this done yet..   Knee Pain Associated symptoms: no fever       Home Medications Prior to Admission medications   Medication Sig Start Date End Date Taking? Authorizing Provider  oxyCODONE-acetaminophen (PERCOCET/ROXICET) 5-325 MG tablet Take 1 tablet by mouth every 6 (six) hours as needed for up to 10 doses for severe pain. 06/17/21  Yes Deatra Canter, Meiah Zamudio, PA-C  amLODipine (NORVASC) 2.5 MG tablet TAKE 1 TABLET BY MOUTH DAILY AT 2 PM. 12/04/20 12/04/21  Martinique, Betty G, MD  cetirizine (ZYRTEC) 10 MG tablet Take 1 tablet (10 mg total) by mouth daily. Patient taking differently: Take 10 mg by mouth daily as needed for allergies. 08/27/18   [name], DO  doxycycline (VIBRAMYCIN) 100 MG capsule Take 1 capsule (100 mg total) by mouth 2 (two) times daily. 03/11/21   Nafziger, Tommi Rumps, NP  fluticasone (FLONASE) 50 MCG/ACT nasal spray PLACE 2 SPRAYS INTO BOTH NOSTRILS DAILY. Patient taking differently: Place 2 sprays into both nostrils daily as needed (allergies.). 10/18/18   [name], DO  hydrochlorothiazide (HYDRODIURIL) 25 MG tablet TAKE 1 TABLET (25 MG TOTAL) BY MOUTH DAILY. 12/04/20 12/04/21  Martinique, Betty G, MD  Insulin Pen Needle 32G X 4 MM MISC Use as directed with Saxenda daily 12/04/20   Martinique,  Betty G, MD  LIDOCAINE EX Apply 1 application topically 4 (four) times daily as needed (back pain.). OTC    [provider]  Liraglutide -Weight Management 18 MG/3ML SOPN INJECT 3 MG (0.5 MLS) INTO THE SKIN DAILY 12/04/20 12/04/21  Martinique, Betty G, MD  mirabegron ER (MYRBETRIQ) 50 MG TB24 tablet Take 1 tablet (50 mg total) by mouth daily. 10/21/19   MacDiarmid, Nicki Reaper, MD  omeprazole (PRILOSEC) 40 MG capsule TAKE 1 CAPSULE BY MOUTH DAILY. 08/04/20 08/04/21  Martinique, Betty G, MD  OVER THE COUNTER MEDICATION Take 1 capsule by mouth daily. [employer], Historical, MD  pravastatin (PRAVACHOL) 10 MG tablet Take 1 tablet (10 mg total) by mouth daily. 12/04/20   Martinique, Betty G, MD  triamcinolone cream (KENALOG) 0.1 % Apply 1 application topically daily as needed. On affected areas of upper extremities. 12/03/19   Martinique, Betty G, MD      Allergies    Patient has no known allergies.    Review of Systems   Review of Systems  Constitutional:  Negative for chills and fever.  Respiratory:  Negative for shortness of breath.   Musculoskeletal:  Positive for arthralgias and joint swelling. Negative for gait problem.  Skin:  Negative for wound.  Neurological:  Negative for weakness.  All other systems reviewed and are negative.  Physical Exam Updated Vital Signs BP (!) 131/98 (  BP Location: Left Arm)    Pulse (!) 106    Temp 98.9 F (37.2 C) (Oral)    Resp 19    Ht 5\' 6"  (1.676 m)    Wt 113.4 kg    LMP 08/02/2013    SpO2 99%    BMI 40.35 kg/m  Physical Exam Vitals and nursing note reviewed.  Constitutional:      General: She is not in acute distress.    Appearance: Normal appearance. She is not ill-appearing.  HENT:     Head: Normocephalic and atraumatic.     Nose: Nose normal.  Eyes:     Conjunctiva/sclera: Conjunctivae normal.  Cardiovascular:     Rate and Rhythm: Normal rate and regular rhythm.  Pulmonary:     Effort: Pulmonary effort is normal. No respiratory distress.   Musculoskeletal:        General: No deformity.     Right lower leg: No edema.     Left lower leg: No edema.     Comments: Patient without visible deformity to right knee.  Mild tenderness to palpation present over the right knee.  5/5 strength in bilateral lower extremities.  Patient able to ambulate with antalgic gait in the room.  2+ DP pulse present.  Sensation intact.  Without warmth or erythema.  Skin:    Findings: No rash.  Neurological:     Mental Status: She is alert.    ED Results / Procedures / Treatments   Labs (all labs ordered are listed, but only abnormal results are displayed) Labs Reviewed - No data to display  EKG None  Radiology DG Knee Complete 4 Views Right  Result Date: 06/17/2021 CLINICAL DATA:  Right knee pain and swelling. No injury. Unable to bear weight. EXAM: RIGHT KNEE - COMPLETE 4+ VIEW COMPARISON:  None. FINDINGS: Mild degenerative changes in the right knee with medial compartment narrowing and small osteophyte formation. No evidence of acute fracture or dislocation. No focal bone lesion or bone destruction. Soft tissues are unremarkable. IMPRESSION: Mild degenerative changes.  No acute displaced fractures identified. Electronically Signed   By: Lucienne Capers M.D.   On: 06/17/2021 21:42    Procedures Procedures    Medications Ordered in ED Medications  ketorolac (TORADOL) 15 MG/ML injection 15 mg (15 mg Intramuscular Given 06/17/21 2240)    ED Course/ Medical Decision Making/ A&P Clinical Course as of 06/17/21 2258  Thu Jun 17, 2021  2226 DG Knee Complete 4 Views Right [AA]    Clinical Course User Index [AA] Evlyn Courier, PA-C                           Medical Decision Making Amount and/or Complexity of Data Reviewed Radiology: ordered. Decision-making details documented in ED Course.  Risk Prescription drug management.    51 year old female presents today for evaluation of right knee pain.  Patient has history of osteoarthritis and  degenerative changes of her left knee which she    was advised to proceed with injections which she declined at that time.  Patient was evaluated by Dr. Mardelle Matte orthopedist per patient's report.  She is without signs or symptoms concerning for septic joint.  Patient is ambulatory.  X-ray shows degenerative changes but without acute fracture.  Patient given Toradol in the emergency room.  Short course of pain medicine to keep on hand.  States she has been taking ibuprofen without relief.  Patient initially tachycardic at 106 however on reevaluation  patient improved to 92.  Patient voices agreement and is in agreement with plan.   Final Clinical Impression(s) / ED Diagnoses Final diagnoses:  Acute pain of right knee    Rx / DC Orders ED Discharge Orders          Ordered    oxyCODONE-acetaminophen (PERCOCET/ROXICET) 5-325 MG tablet  Every 6 hours PRN        06/17/21 2258              Evlyn Courier, PA-C 06/17/21 2310    Lacretia Leigh, MD 06/18/21 1630

## 2021-06-17 NOTE — ED Triage Notes (Signed)
Pt says she has been having some right knee pain (denies injury), but noticed increased swelling in it yesterday.

## 2021-06-17 NOTE — Discharge Instructions (Signed)
Your evaluated in the emergency room for knee pain.  Your knee x-ray showed degenerative changes.  No fractures.  You stated you have a orthopedist to use saw for your left knee.  I have attached his information for you above.  Want you to give them a call and schedule an appointment to be evaluated in clinic.  You received a shot of Toradol in the emergency room.  I have sent in pain medication for you to use as needed for severe pain.  Continue using ibuprofen.

## 2021-06-18 ENCOUNTER — Other Ambulatory Visit (HOSPITAL_COMMUNITY): Payer: Self-pay

## 2021-06-18 DIAGNOSIS — L910 Hypertrophic scar: Secondary | ICD-10-CM | POA: Diagnosis not present

## 2021-06-21 ENCOUNTER — Encounter: Payer: 59 | Admitting: Physical Therapy

## 2021-06-21 ENCOUNTER — Telehealth: Payer: Self-pay | Admitting: Physical Therapy

## 2021-06-21 NOTE — Therapy (Incomplete)
OUTPATIENT PHYSICAL THERAPY TREATMENT NOTE   Patient Name: Kayla [name] MRN: [id number] DOB:01-Dec-1970, 51 y.o., female Today's Date: 06/21/2021  PCP: Martinique, Betty G, MD REFERRING PROVIDER: Marybelle Killings, MD     Past Medical History:  Diagnosis Date   Allergy    Anemia    Arthritis    lt knee   Back pain    Breast cyst, right    Constipation    Dyspnea    Fatigue    GERD (gastroesophageal reflux disease)    Hyperlipidemia    Hypertension    Joint pain    Leg edema    Obesity    Sleep apnea    lost weight 2013 gastric sleeve, no longer a problem   Sleep disturbance    SVD (spontaneous vaginal delivery)    x 3   Tobacco use    Past Surgical History:  Procedure Laterality Date   ABDOMINAL HYSTERECTOMY     BILATERAL SALPINGECTOMY Bilateral 09/27/2013   Procedure: BILATERAL SALPINGECTOMY;  Surgeon: Lahoma Crocker, MD;  Location: [facility];  Service: Gynecology;  Laterality: Bilateral;   BREAST BIOPSY Right 06/18/2020   BREAST CYST EXCISION Right 06/18/2020   Procedure: EXCISION RIGHT BREAST CHRONICALLY INFECTED SEBACEOUS CYST;  Surgeon: Coralie Keens, MD;  Location: [facility];  Service: General;  Laterality: Right;   CESAREAN SECTION     x 1   COLONOSCOPY  11/12/2020   2016   CYSTO WITH HYDRODISTENSION N/A 09/30/2019   Procedure: CYSTOSCOPY/HYDRODISTENSION;  Surgeon: Bjorn Loser, MD;  Location: [facility];  Service: Urology;  Laterality: N/A;  fulgeration   CYSTOSCOPY WITH BIOPSY N/A 09/30/2019   Procedure: CYSTOSCOPY WITH BLADDER BIOPSY;  Surgeon: Bjorn Loser, MD;  Location: [facility];  Service: Urology;  Laterality: N/A;   HAND FUSION     LAPAROTOMY Left 09/27/2013   Procedure: EXPLORATORY LAPAROTOMY W/ EXPLORATION OF LEFT ILLIAC ARTERY & VEIN;  Surgeon: Lahoma Crocker, MD;  Location: [facility];  Service: Gynecology;  Laterality: Left;   ROBOTIC ASSISTED TOTAL HYSTERECTOMY N/A 09/27/2013   Procedure: ROBOTIC ASSISTED TOTAL  HYSTERECTOMY;  Surgeon: Lahoma Crocker, MD;  Location: [facility];  Service: Gynecology;  Laterality: N/A;   SLEEVE GASTROPLASTY  2013   in [place]   Patient Active Problem List   Diagnosis Date Noted   Facet degeneration of lumbar region 05/27/2021   Dyslipidemia (high LDL; low HDL) 12/04/2020   Insomnia 12/04/2020   Cigarette smoker 09/09/2020   Type 2 diabetes mellitus in remission ([place]) 12/03/2019   Essential hypertension 12/03/2019   Vitamin D deficiency, unspecified 12/03/2019   Eczema 12/03/2019   Other fatigue 12/12/2017   Shortness of breath on exertion 12/12/2017   Greater trochanteric bursitis of right hip 12/15/2015   Tobacco use disorder 11/07/2014   Morbid obesity ([place]) 11/07/2014    REFERRING DIAG: M54.50,G89.29 (ICD-10-CM) - Chronic bilateral low back pain, unspecified whether sciatica present   THERAPY DIAG:  No diagnosis found.  PERTINENT HISTORY: Allergy Anemia Arthritislt knee Back pain Breast cyst, right Constipation Dyspnea Fatigue GERD (gastroesophageal reflux disease) Hyperlipidemia Hypertension Joint pain Leg edema Obesity Sleep apnea   PRECAUTIONS: none  SUBJECTIVE: *** Pt arriving today reporting 7-8/10 low back pain and bilateral knee pain. Pt stating she didn't get a chance to work on the stretches provided at her initial evaluation.  PAIN:  06/09/2021 Are you having pain? Yes NPRS scale: 7/10 Pain location: low back and bilateral knees Pain orientation: Bilateral  PAIN TYPE: aching and throbbing Pain description: constant  Aggravating factors: movements Relieving factors: changing positions  Today's treatment  06/21/21 ***    06/09/21  Therapeutic Exercise:  Aerobic: bike: L3 x 6 minutes Supine: trunk rotation x 3 each side holding 30 seconds              Piriformis stretch x 3 each side holding 20 seconds               Supine marching x 20 alternating each LE with instructions for core activation Prone:  Seated: Hamstring stretch x 2 each  LE holding 30 seconds  Neuromuscular Re-education: Manual Therapy: Therapeutic Activity: Self Care: Trigger Point Dry Needling:  Modalities:  E-stim: Pre moderate 15 minutes intensity to tolerance to lumbar paraspinals Moist Heat: x 15 minutes with E-stim to low back     LUMBAR AROM/PROM   A/PROM A/PROM  05/31/2021  Flexion A limited 25%  Extension WNL (directional preference)  Right lateral flexion Limited 25% (Rt side pain)  Left lateral flexion Limited 25% (Lt side pain)  Right rotation WNL  Left rotation WNL   (Blank rows = not tested)     LE MMT:   MMT Right 05/31/2021 Left 05/31/2021  Hip flexion 3/5 3+/5  Hip extension      Hip abduction      Hip adduction      Hip internal rotation      Hip external rotation      Knee flexion 4/5 4/5  Knee extension 3+/5 3+/5  Ankle dorsiflexion 4/5 4/5  Ankle plantarflexion      Ankle inversion      Ankle eversion       (Blank rows = not tested)   05/31/21: giveway weakness noted with all testing; suspect hip ext weakness due to decreased step length and difficulty rising from chair without UE support     LUMBAR SPECIAL TESTS:  Slump test: Positive  slight decrease in symptoms with reduction in neural tension on Rt (at neck); Lt no change      GAIT: Distance walked: 100 Assistive device utilized: None Level of assistance: Complete Independence Comments: decreased step length bil            PATIENT EDUCATION:  Education details:reviewed HEP, TENS Person educated: Patient Education method: Explanation, Demonstration, and Handouts Education comprehension: verbalized understanding, returned demonstration, and needs further education     HOME EXERCISE PROGRAM: Access Code: [email or web address] URL: [email or web address] Date: 05/31/2021 Prepared by: Faustino Congress   Exercises Hooklying Single Knee to Chest Stretch with Towel - 2 x daily - 7 x weekly - 1 sets - 3 reps - 20-30 sec hold Supine  Piriformis Stretch with Towel - 2 x daily - 7 x weekly - 1 sets - 3 reps - 20-30 sec hold Standing Lumbar Extension at Wall - Forearms - 2-3 x daily - 7 x weekly - 1 sets - 10 reps - 10 sec hold Seated Scapular Retraction - 2 x daily - 7 x weekly - 1 sets - 10 reps - 5 sec hold   Patient Education TENS Unit for home purchase and use   ASSESSMENT:   CLINICAL IMPRESSION: ***  Pt arriving today reporting 7/10 low back pain and bilateral knee pain. Pt amb with antalgic gait pattern. Pt  with limited tolerance to exercises due to increased pain today. Pt with good response to E-stim treatment to lumbar spine. Continue skilled Pt to maximize pt's function and progress toward LTG's.    Objective impairments include Abnormal gait, decreased activity  tolerance, decreased endurance, decreased ROM, decreased strength, increased fascial restrictions, impaired flexibility, improper body mechanics, postural dysfunction, and pain. These impairments are limiting patient from cleaning, community activity, and occupation. Personal factors including Time since onset of injury/illness/exacerbation and 3+ comorbidities: OA, HTN, obesity s/p gastric sleeve, smoker  are also affecting patient's functional outcome. Patient will benefit from skilled PT to address above impairments and improve overall function.   REHAB POTENTIAL: Good        GOALS: Goals reviewed with patient? Yes   SHORT TERM GOALS:   STG Name Target Date Goal status  1 Independent with initial HEP Baseline:  06/21/2021 INITIAL                                                          LONG TERM GOALS:    LTG Name Target Date Goal status  1 Independent with final HEP Baseline: 07/12/2021 INITIAL  2 FOTO score improved to 68 for improved function Baseline: 07/12/2021 INITIAL  3 Report pain < 5/10 with full work day for improved function Baseline: 07/12/2021 INITIAL  4 Demonstrate 4/5 bil LE strength for improved function Baseline:  07/12/2021 INITIAL                               PLAN: PT FREQUENCY: 1x/week   PT DURATION: 6 weeks   PLANNED INTERVENTIONS: Therapeutic exercises, Therapeutic activity, Neuro Muscular re-education, Balance training, Gait training, Patient/Family education, Joint mobilization, Stair training, Dry Needling, Electrical stimulation, Spinal mobilization, Cryotherapy, Moist heat, Taping, Traction, and Manual therapy   PLAN FOR NEXT SESSION:  *** lumbar stretching, core activation, assess E-stim tolerance             Faustino Congress, PT, MPT 06/21/2021, 7:56 AM

## 2021-06-21 NOTE — Telephone Encounter (Signed)
Spoke with pt who reports she hurt her knee last week and now has to follow up with Dr. Mardelle Matte.  She requested remaining PT appts be canceled for now.  Will cancel remaining PT visits.  Laureen Abrahams, PT, DPT 06/21/21 10:38 AM

## 2021-06-23 ENCOUNTER — Other Ambulatory Visit (HOSPITAL_COMMUNITY): Payer: Self-pay

## 2021-06-23 DIAGNOSIS — M25561 Pain in right knee: Secondary | ICD-10-CM | POA: Diagnosis not present

## 2021-06-23 MED ORDER — MELOXICAM 15 MG PO TABS
ORAL_TABLET | ORAL | 1 refills | Status: DC
  Filled 2021-06-23: qty 30, 30d supply, fill #0
  Filled 2022-01-10: qty 30, 30d supply, fill #1

## 2021-06-25 ENCOUNTER — Telehealth: Payer: Self-pay | Admitting: Family Medicine

## 2021-06-25 ENCOUNTER — Other Ambulatory Visit: Payer: Self-pay

## 2021-06-25 NOTE — Telephone Encounter (Signed)
Patient is aware that medication list & vaccine list are up front for her.

## 2021-06-25 NOTE — Telephone Encounter (Signed)
Patient is requesting to pick up medication lists and vaccination on Monday morning.  Please advise.

## 2021-06-30 ENCOUNTER — Encounter: Payer: 59 | Admitting: Physical Therapy

## 2021-08-20 ENCOUNTER — Telehealth: Payer: Self-pay | Admitting: Acute Care

## 2021-08-23 NOTE — Telephone Encounter (Signed)
Noted  

## 2021-09-09 ENCOUNTER — Inpatient Hospital Stay: Admission: RE | Admit: 2021-09-09 | Payer: 59 | Source: Ambulatory Visit

## 2021-10-01 DIAGNOSIS — M170 Bilateral primary osteoarthritis of knee: Secondary | ICD-10-CM | POA: Diagnosis not present

## 2021-11-05 NOTE — Progress Notes (Unsigned)
Chief Complaint  Patient presents with   Medication Refill    All meds, would like to change Saxenda to [place].   Urinary Tract Infection   HPI: [name] is a 51 y.o. female, who is here today for follow up and c/o dysuria. She was last seen on 03/26/21. Since her last visit she has seen ortho and surgeon.  Intermittent dysuria for " a while." She is positive it is a UTI. Dark urine and 2 weeks of suprapubic abdominal pain, not radiated, 7/10, intermittent. She has not identified exacerbating or alleviating factors.  Pressure like pain, urge to urinate with no much urination. Negative for urgency ,gross hematuria,or incontinence.  S/P hysterectomy. Negative for vaginal bleeding or discharge.  Constipation, bowel movement used to be daily am but for the past couple months she needs to take OTC laxative. She took Miralax Wednesday and had a bowel movement Friday (3 days ago). + Passing gas. Negative for blood in stool.  Hypertension: Dx'ed around 2008-2009. Medications:HVTZ 25 mg daily and Amlodipine 2.5 mg daily. BP readings at home:Not checking. Side effects:None.  Negative for unusual or severe headache, visual changes, exertional chest pain, dyspnea,  focal weakness, or edema. Lab Results  Component Value Date   CREATININE 0.79 12/04/2020   BUN 20 12/04/2020   NA 140 12/04/2020   K 4.1 12/04/2020   CL 105 12/04/2020   CO2 28 12/04/2020   Hyperlipidemia: She has not taken Pravastatin in a while. Side effects from medication:None.  Lab Results  Component Value Date   CHOL 152 12/04/2020   HDL 48.20 12/04/2020   [place] 92 12/04/2020   TRIG 62.0 12/04/2020   CHOLHDL 3 12/04/2020   Diabetes Mellitus II: Dx'ed in 2009 in remission since bariatric surgery in 2011. - Checking BG at home: Not checking. -On non pharmacologic treatment. - Diet: She is eating one meal daily,small portion. - Exercise: She is active at work, does not have an exercise  routine. - eye exam: Is due. - foot exam: 11/2020. - Negative for symptoms of hypoglycemia, polyuria, polydipsia, numbness extremities, foot ulcers/trauma  Lab Results  Component Value Date   HGBA1C 6.2 12/04/2020   Vit D deficiency: She is not on vit D supplementation. Last 25 OH vit D 27 in 11/2020.  Review of Systems  Constitutional:  Negative for activity change, appetite change and fever.  HENT:  Negative for mouth sores, nosebleeds, sore throat and trouble swallowing.   Respiratory:  Negative for cough and wheezing.   Gastrointestinal:  Negative for nausea and vomiting.  Endocrine: Negative for cold intolerance and heat intolerance.  Neurological:  Negative for seizures, syncope, facial asymmetry and weakness.  Rest see pertinent positives and negatives per HPI.  Current Outpatient Medications on File Prior to Visit  Medication Sig Dispense Refill   cetirizine (ZYRTEC) 10 MG tablet Take 1 tablet (10 mg total) by mouth daily. (Patient taking differently: Take 10 mg by mouth daily as needed for allergies.) 30 tablet 11   Insulin Pen Needle 32G X 4 MM MISC Use as directed with Saxenda daily 100 each 1   LIDOCAINE EX Apply 1 application topically 4 (four) times daily as needed (back pain.). OTC     meloxicam (MOBIC) 15 MG tablet Take 1 tablet by mouth once a day with food 30 tablet 1   mirabegron ER (MYRBETRIQ) 50 MG TB24 tablet Take 1 tablet (50 mg total) by mouth daily. 30 tablet 11   OVER THE COUNTER MEDICATION Take 1 capsule  by mouth daily. [place]     oxyCODONE-acetaminophen (PERCOCET/ROXICET) 5-325 MG tablet Take 1 tablet by mouth every 6 (six) hours as needed for up to 10 doses for severe pain. 10 tablet 0   triamcinolone cream (KENALOG) 0.1 % Apply 1 application topically daily as needed. On affected areas of upper extremities. 30 g 3   omeprazole (PRILOSEC) 40 MG capsule TAKE 1 CAPSULE BY MOUTH DAILY. 30 capsule 3   pravastatin (PRAVACHOL) 10 MG tablet Take 1 tablet (10 mg  total) by mouth daily. 90 tablet 3   No current facility-administered medications on file prior to visit.    Past Medical History:  Diagnosis Date   Allergy    Anemia    Arthritis    lt knee   Back pain    Breast cyst, right    Constipation    Dyspnea    Fatigue    GERD (gastroesophageal reflux disease)    Hyperlipidemia    Hypertension    Joint pain    Leg edema    Obesity    Sleep apnea    lost weight 2013 gastric sleeve, no longer a problem   Sleep disturbance    SVD (spontaneous vaginal delivery)    x 3   Tobacco use    No Known Allergies  Social History   Socioeconomic History   Marital status: Married    Spouse name: Kayla [name]   Number of children: 4   Years of education: Not on file   Highest education level: Not on file  Occupational History   Not on file  Tobacco Use   Smoking status: Former    Packs/day: 1.00    Years: 25.00    Total pack years: 25.00    Types: Cigarettes    Quit date: 08/27/2020    Years since quitting: 1.2   Smokeless tobacco: Current  Vaping Use   Vaping Use: Never used  Substance and Sexual Activity   Alcohol use: Yes    Alcohol/week: 0.0 standard drinks of alcohol    Comment: socially   Drug use: No   Sexual activity: Yes    Partners: Male    Birth control/protection: Surgical  Other Topics Concern   Not on file  Social History Narrative   Work or School: Producer, television/film/video, Catering manager      Home Situation: lives with husband and 74 yo sone in 2016      Spiritual Beliefs: Christian      Lifestyle: no regular exercise, poor diet      Social Determinants of Health   Financial Resource Strain: Not on file  Food Insecurity: Not on file  Transportation Needs: Not on file  Physical Activity: Not on file  Stress: Not on file  Social Connections: Not on file   Vitals:   11/08/21 0750  BP: 128/78  Pulse: 98  Resp: 12  SpO2: 98%   Body mass index is 37.67 kg/m.  Physical Exam Vitals and nursing note  reviewed.  Constitutional:      General: She is not in acute distress.    Appearance: She is well-developed. She is not ill-appearing.  HENT:     Head: Normocephalic and atraumatic.     Mouth/Throat:     Mouth: Mucous membranes are moist.  Eyes:     General: No scleral icterus.    Conjunctiva/sclera: Conjunctivae normal.  Cardiovascular:     Rate and Rhythm: Normal rate and regular rhythm.     Heart sounds: No  murmur heard. Pulmonary:     Effort: Pulmonary effort is normal. No respiratory distress.     Breath sounds: Normal breath sounds.  Abdominal:     General: Bowel sounds are normal. There is no distension.     Palpations: Abdomen is soft. There is no mass.     Tenderness: There is abdominal tenderness in the suprapubic area. There is no right CVA tenderness, left CVA tenderness, guarding or rebound.  Lymphadenopathy:     Cervical: No cervical adenopathy.  Skin:    General: Skin is warm.     Findings: No erythema or rash.  Neurological:     General: No focal deficit present.     Mental Status: She is alert and oriented to person, place, and time.  Psychiatric:     Comments: Well groomed, good eye contact.    Diabetic Foot Exam - Simple   Simple Foot Form Diabetic Foot exam was performed with the following findings: Yes 11/08/2021  8:50 PM  Visual Inspection See comments: Yes Sensation Testing Intact to touch and monofilament testing bilaterally: Yes Pulse Check Posterior Tibialis and Dorsalis pulse intact bilaterally: Yes Comments [place].    ASSESSMENT AND PLAN:  Kayla [name] was seen today for medication refill and urinary tract infection.  Diagnoses and all orders for this visit: Orders Placed This Encounter  Procedures   Culture, Urine   Lipid panel   VITAMIN D 25 Hydroxy (Vit-D Deficiency, Fractures)   TSH   Hemoglobin A1c   CBC   Basic metabolic panel   Hepatic function panel   Microalbumin / creatinine urine ratio   POCT urinalysis dipstick   Lab  Results  Component Value Date   LABMICR See below: 09/02/2019   LABMICR See below: 08/08/2019   MICROALBUR 1.3 11/08/2021   Lab Results  Component Value Date   CHOL 130 11/08/2021   HDL 44.30 11/08/2021   LDLCALC 74 11/08/2021   TRIG 59.0 11/08/2021   CHOLHDL 3 11/08/2021   Lab Results  Component Value Date   WBC 6.7 11/08/2021   HGB 13.0 11/08/2021   HCT 40.4 11/08/2021   MCV 86.4 11/08/2021   PLT 236.0 11/08/2021   Lab Results  Component Value Date   CREATININE 0.80 11/08/2021   BUN 18 11/08/2021   NA 140 11/08/2021   K 3.6 11/08/2021   CL 104 11/08/2021   CO2 27 11/08/2021    Lab Results  Component Value Date   ALT 9 11/08/2021   AST 10 11/08/2021   ALKPHOS 40 11/08/2021   BILITOT 0.3 11/08/2021   Lab Results  Component Value Date   TSH 1.79 11/08/2021   Lab Results  Component Value Date   HGBA1C 6.3 11/08/2021   Dysuria We discussed possible etiologies. It seems to be a chronic problem. Urine dipstick today negative for bacteria,leuk,nitrite,and blood. Pos for bilirubin and protein. Ucx sent, further recommendations according to results.  Suprapubic abdominal pain No signs of acute abdomen. I do not think imaging is needed today. If it does not resolve with treating constipation, will consider pelvic/transvaginal US or abdominal CT. Instructed about warning signs. F/U in 2-3 weeks if still having pain.  Constipation, unspecified constipation type Getting worse. Colonoscopy in 11/2020, 5 years f/u was recommended. This problem could be contributing to her lower abdominal pain. Increase fiber intake, Benefiber 1 tsp bid recommended. Adequate hydration. She would like Rx medication, Linzess 145 mcg daily as needed, some side effects discussed. Instructed about warning signs.  Vitamin D deficiency, unspecified  We discussed general recommendations in regard to vit D supplementation. Recommend 800 U daily for now, further recommendations according  to 25 OH vit D result.  Type 2 diabetes mellitus in remission ([place]) HgA1C has been at goal. Continue non pharmacologic treatment. Annual eye exam, periodic dental and foot care recommended. F/U in 5-6 months  Dyslipidemia (high LDL; low HDL) Stopped Pravastatin, no side effects reported. We discussed CV benefits of statins. FLP ordered today, further recommendation according to [place] result.  Essential hypertension BP adequately controlled. Continue current management: HCTZ 25 mg and Amlodipine 2.5 mg daily. DASH/low salt diet to continue. Monitor BP at home. Eye exam is due.  I spent a total of 44 minutes in both face to face and non face to face activities for this visit on the date of this encounter. During this time history was obtained and documented, examination was performed, prior labs reviewed, and assessment/plan discussed.  Return in about 6 months (around 05/11/2022) for In 2 weeks if still having abdominal pain.Marland Kitchen   Jerard Bays G. Martinique, MD  [facility]. [place] office.

## 2021-11-08 ENCOUNTER — Other Ambulatory Visit (HOSPITAL_COMMUNITY): Payer: Self-pay

## 2021-11-08 ENCOUNTER — Encounter: Payer: Self-pay | Admitting: Family Medicine

## 2021-11-08 ENCOUNTER — Ambulatory Visit (INDEPENDENT_AMBULATORY_CARE_PROVIDER_SITE_OTHER): Payer: Federal, State, Local not specified - PPO | Admitting: Family Medicine

## 2021-11-08 VITALS — BP 128/78 | HR 98 | Resp 12 | Ht 66.0 in | Wt 233.4 lb

## 2021-11-08 DIAGNOSIS — E559 Vitamin D deficiency, unspecified: Secondary | ICD-10-CM

## 2021-11-08 DIAGNOSIS — E119 Type 2 diabetes mellitus without complications: Secondary | ICD-10-CM

## 2021-11-08 DIAGNOSIS — I10 Essential (primary) hypertension: Secondary | ICD-10-CM | POA: Diagnosis not present

## 2021-11-08 DIAGNOSIS — K5900 Constipation, unspecified: Secondary | ICD-10-CM | POA: Diagnosis not present

## 2021-11-08 DIAGNOSIS — E785 Hyperlipidemia, unspecified: Secondary | ICD-10-CM | POA: Diagnosis not present

## 2021-11-08 DIAGNOSIS — R300 Dysuria: Secondary | ICD-10-CM

## 2021-11-08 DIAGNOSIS — R102 Pelvic and perineal pain: Secondary | ICD-10-CM

## 2021-11-08 LAB — HEPATIC FUNCTION PANEL
ALT: 9 U/L (ref 0–35)
AST: 10 U/L (ref 0–37)
Albumin: 4.1 g/dL (ref 3.5–5.2)
Alkaline Phosphatase: 40 U/L (ref 39–117)
Bilirubin, Direct: 0.1 mg/dL (ref 0.0–0.3)
Total Bilirubin: 0.3 mg/dL (ref 0.2–1.2)
Total Protein: 7.2 g/dL (ref 6.0–8.3)

## 2021-11-08 LAB — CBC
HCT: 40.4 % (ref 36.0–46.0)
Hemoglobin: 13 g/dL (ref 12.0–15.0)
MCHC: 32.2 g/dL (ref 30.0–36.0)
MCV: 86.4 fl (ref 78.0–100.0)
Platelets: 236 10*3/uL (ref 150.0–400.0)
RBC: 4.68 Mil/uL (ref 3.87–5.11)
RDW: 13.8 % (ref 11.5–15.5)
WBC: 6.7 10*3/uL (ref 4.0–10.5)

## 2021-11-08 LAB — POCT URINALYSIS DIPSTICK
Bilirubin, UA: POSITIVE
Blood, UA: NEGATIVE
Glucose, UA: NEGATIVE
Ketones, UA: NEGATIVE
Leukocytes, UA: NEGATIVE
Nitrite, UA: NEGATIVE
Protein, UA: POSITIVE — AB
Spec Grav, UA: 1.015 (ref 1.010–1.025)
Urobilinogen, UA: 1 E.U./dL
pH, UA: 6 (ref 5.0–8.0)

## 2021-11-08 LAB — BASIC METABOLIC PANEL
BUN: 18 mg/dL (ref 6–23)
CO2: 27 mEq/L (ref 19–32)
Calcium: 9.4 mg/dL (ref 8.4–10.5)
Chloride: 104 mEq/L (ref 96–112)
Creatinine, Ser: 0.8 mg/dL (ref 0.40–1.20)
GFR: 84.12 mL/min (ref >60.00)
Glucose, Bld: 69 mg/dL — ABNORMAL LOW (ref 70–99)
Potassium: 3.6 mEq/L (ref 3.5–5.1)
Sodium: 140 mEq/L (ref 135–145)

## 2021-11-08 LAB — LIPID PANEL
Cholesterol: 130 mg/dL (ref 0–200)
HDL: 44.3 mg/dL (ref >39.00)
LDL Cholesterol: 74 mg/dL (ref 0–99)
NonHDL: 85.36
Total CHOL/HDL Ratio: 3
Triglycerides: 59 mg/dL (ref 0.0–149.0)
VLDL: 11.8 mg/dL (ref 0.0–40.0)

## 2021-11-08 LAB — MICROALBUMIN / CREATININE URINE RATIO
Creatinine,U: 227.9 mg/dL
Microalb Creat Ratio: 0.6 mg/g (ref 0.0–30.0)
Microalb, Ur: 1.3 mg/dL (ref 0.0–1.9)

## 2021-11-08 LAB — VITAMIN D 25 HYDROXY (VIT D DEFICIENCY, FRACTURES): VITD: 28.36 ng/mL — ABNORMAL LOW (ref 30.00–100.00)

## 2021-11-08 LAB — HEMOGLOBIN A1C: Hgb A1c MFr Bld: 6.3 % (ref 4.6–6.5)

## 2021-11-08 LAB — TSH: TSH: 1.79 u[IU]/mL (ref 0.35–5.50)

## 2021-11-08 MED ORDER — HYDROCHLOROTHIAZIDE 25 MG PO TABS
ORAL_TABLET | Freq: Every day | ORAL | 2 refills | Status: DC
  Filled 2021-11-08: qty 90, 90d supply, fill #0

## 2021-11-08 MED ORDER — PRAVASTATIN SODIUM 10 MG PO TABS
10.0000 mg | ORAL_TABLET | Freq: Every day | ORAL | 3 refills | Status: DC
  Filled 2021-11-08: qty 90, 90d supply, fill #0

## 2021-11-08 MED ORDER — AMLODIPINE BESYLATE 2.5 MG PO TABS
ORAL_TABLET | ORAL | 2 refills | Status: DC
  Filled 2021-11-08: qty 90, 90d supply, fill #0

## 2021-11-08 MED ORDER — LINACLOTIDE 145 MCG PO CAPS
145.0000 ug | ORAL_CAPSULE | Freq: Every day | ORAL | 0 refills | Status: DC
  Filled 2021-11-08: qty 30, 30d supply, fill #0

## 2021-11-08 NOTE — Assessment & Plan Note (Signed)
Stopped Pravastatin, no side effects reported. We discussed CV benefits of statins. FLP ordered today, further recommendation according to [place] result.

## 2021-11-08 NOTE — Assessment & Plan Note (Signed)
We discussed general recommendations in regard to vit D supplementation. Recommend 800 U daily for now, further recommendations according to 25 OH vit D result.

## 2021-11-08 NOTE — Assessment & Plan Note (Signed)
HgA1C has been at goal. Continue non pharmacologic treatment. Annual eye exam, periodic dental and foot care recommended. F/U in 5-6 months

## 2021-11-08 NOTE — Patient Instructions (Addendum)
A few things to remember from today's visit:   Dysuria - Plan: POCT urinalysis dipstick, Culture, Urine  Essential hypertension - Plan: Basic metabolic panel  Type 2 diabetes mellitus in remission ([place]) - Plan: Hemoglobin A1c, Microalbumin / creatinine urine ratio  Vitamin D deficiency, unspecified - Plan: VITAMIN D 25 Hydroxy (Vit-D Deficiency, Fractures)  Dyslipidemia (high LDL; low HDL) - Plan: Lipid panel  Suprapubic abdominal pain  Constipation, unspecified constipation type - Plan: TSH, CBC, Hepatic function panel, linaclotide (LINZESS) 145 MCG CAPS capsule  If you need refills please call your pharmacy. Do not use My Chart to request refills or for acute issues that need immediate attention.   Constipation could be causing lower abdominal discomfort. Continue adequate water intake. Increase fiber intake, Benefiber 1 tsp 2 times daily will help. We can do Linzess for a few days and once you feel better we can try again Miralax and Bisacodyl 5 mg daily as needed at bedtime. No change sin blood pressure meds.  You are due for eye exam. Please be sure medication list is accurate. If a new problem present, please set up appointment sooner than planned today.  Constipation, Adult Constipation is when a person has trouble pooping (having a bowel movement). When you have this condition, you may poop fewer than 3 times a week. Your poop (stool) may also be dry, hard, or bigger than normal. Follow these instructions at home: Eating and drinking  Eat foods that have a lot of fiber, such as: Fresh fruits and vegetables. Whole grains. Beans. Eat less of foods that are low in fiber and high in fat and sugar, such as: [place] fries. Hamburgers. Cookies. Candy. Soda. Drink enough fluid to keep your pee (urine) pale yellow. General instructions Exercise regularly or as told by your doctor. Try to do 150 minutes of exercise each week. Go to the restroom when you feel like you need  to poop. Do not hold it in. Take over-the-counter and prescription medicines only as told by your doctor. These include any fiber supplements. When you poop: Do deep breathing while relaxing your lower belly (abdomen). Relax your pelvic floor. The pelvic floor is a group of muscles that support the rectum, bladder, and intestines (as well as the uterus in women). Watch your condition for any changes. Tell your doctor if you notice any. Keep all follow-up visits as told by your doctor. This is important. Contact a doctor if: You have pain that gets worse. You have a fever. You have not pooped for 4 days. You vomit. You are not hungry. You lose weight. You are bleeding from the opening of the butt (anus). You have thin, pencil-like poop. Get help right away if: You have a fever, and your symptoms suddenly get worse. You leak poop or have blood in your poop. Your belly feels hard or bigger than normal (bloated). You have very bad belly pain. You feel dizzy or you faint. Summary Constipation is when a person poops fewer than 3 times a week, has trouble pooping, or has poop that is dry, hard, or bigger than normal. Eat foods that have a lot of fiber. Drink enough fluid to keep your pee (urine) pale yellow. Take over-the-counter and prescription medicines only as told by your doctor. These include any fiber supplements. This information is not intended to replace advice given to you by your health care provider. Make sure you discuss any questions you have with your health care provider. Document Revised: 03/13/2019 Document Reviewed: 03/13/2019  Elsevier Patient Education  [place].

## 2021-11-08 NOTE — Assessment & Plan Note (Signed)
BP adequately controlled. Continue current management: HCTZ 25 mg and Amlodipine 2.5 mg daily. DASH/low salt diet to continue. Monitor BP at home. Eye exam is due.

## 2021-11-09 LAB — URINE CULTURE
MICRO NUMBER:: 13600987
SPECIMEN QUALITY:: ADEQUATE

## 2021-11-10 ENCOUNTER — Other Ambulatory Visit (HOSPITAL_COMMUNITY): Payer: Self-pay

## 2021-11-11 ENCOUNTER — Other Ambulatory Visit (HOSPITAL_COMMUNITY): Payer: Self-pay

## 2021-11-13 ENCOUNTER — Other Ambulatory Visit (HOSPITAL_COMMUNITY): Payer: Self-pay

## 2021-11-13 ENCOUNTER — Other Ambulatory Visit: Payer: Self-pay | Admitting: Family Medicine

## 2021-11-13 ENCOUNTER — Encounter: Payer: Self-pay | Admitting: Family Medicine

## 2021-11-15 ENCOUNTER — Other Ambulatory Visit (HOSPITAL_COMMUNITY): Payer: Self-pay

## 2021-11-15 ENCOUNTER — Telehealth: Payer: Self-pay | Admitting: Family Medicine

## 2021-11-15 MED ORDER — LUBIPROSTONE 8 MCG PO CAPS
8.0000 ug | ORAL_CAPSULE | Freq: Two times a day (BID) | ORAL | 0 refills | Status: DC
  Filled 2021-11-15 – 2022-01-10 (×2): qty 60, 30d supply, fill #0

## 2021-11-15 NOTE — Telephone Encounter (Signed)
Another options is Amitiza 8 mg bid. She can also try Miralax at nigh with Bisacodyl 5 mg daily as needed. Benefiber 1 teaspoon twice daily and adequate hydration. Thanks, BJ

## 2021-11-15 NOTE — Telephone Encounter (Signed)
Insurance denied the [employer], is there any other options?

## 2021-11-15 NOTE — Telephone Encounter (Signed)
Pt is calling and [employer] needs PA  [place] Phone:  [phone]  Fax:  [phone]

## 2021-11-15 NOTE — Telephone Encounter (Signed)
Insurance denied coverage on the [place].

## 2021-11-15 NOTE — Telephone Encounter (Signed)
Rx sent in, see mychart encounter.

## 2021-11-17 ENCOUNTER — Other Ambulatory Visit (HOSPITAL_COMMUNITY): Payer: Self-pay

## 2021-11-20 ENCOUNTER — Encounter: Payer: Self-pay | Admitting: Family Medicine

## 2021-11-20 ENCOUNTER — Other Ambulatory Visit (HOSPITAL_COMMUNITY): Payer: Self-pay

## 2021-11-22 ENCOUNTER — Other Ambulatory Visit (HOSPITAL_COMMUNITY): Payer: Self-pay

## 2021-11-23 ENCOUNTER — Other Ambulatory Visit: Payer: Self-pay | Admitting: Family Medicine

## 2022-01-10 ENCOUNTER — Other Ambulatory Visit: Payer: Self-pay | Admitting: Family Medicine

## 2022-01-11 ENCOUNTER — Other Ambulatory Visit (HOSPITAL_COMMUNITY): Payer: Self-pay

## 2022-01-11 MED ORDER — INSULIN PEN NEEDLE 32G X 4 MM MISC
1 refills | Status: DC
  Filled 2022-01-11: qty 100, 90d supply, fill #0

## 2022-01-11 MED ORDER — OMEPRAZOLE 40 MG PO CPDR
DELAYED_RELEASE_CAPSULE | Freq: Every day | ORAL | 3 refills | Status: DC
  Filled 2022-01-11: qty 30, 30d supply, fill #0

## 2022-01-20 ENCOUNTER — Other Ambulatory Visit (HOSPITAL_COMMUNITY): Payer: Self-pay

## 2022-01-21 ENCOUNTER — Other Ambulatory Visit (HOSPITAL_COMMUNITY): Payer: Self-pay

## 2022-01-31 ENCOUNTER — Other Ambulatory Visit (HOSPITAL_COMMUNITY): Payer: Self-pay

## 2022-04-18 ENCOUNTER — Ambulatory Visit (INDEPENDENT_AMBULATORY_CARE_PROVIDER_SITE_OTHER): Payer: Federal, State, Local not specified - PPO | Admitting: Family Medicine

## 2022-04-18 ENCOUNTER — Other Ambulatory Visit (HOSPITAL_COMMUNITY): Payer: Self-pay

## 2022-04-18 VITALS — BP 114/74 | HR 105 | Temp 97.5°F | Ht 66.0 in | Wt 214.9 lb

## 2022-04-18 DIAGNOSIS — R221 Localized swelling, mass and lump, neck: Secondary | ICD-10-CM

## 2022-04-18 MED ORDER — AMOXICILLIN-POT CLAVULANATE 875-125 MG PO TABS
1.0000 | ORAL_TABLET | Freq: Two times a day (BID) | ORAL | 0 refills | Status: DC
  Filled 2022-04-18: qty 20, 10d supply, fill #0

## 2022-04-18 NOTE — Patient Instructions (Signed)
Follow up with Dr Martinique or me in one week if not improving  Follow up immediately for any fever, redness, warmth, or increased swelling  Take the antibiotic twice daily.

## 2022-04-18 NOTE — Progress Notes (Signed)
Established Patient Office Visit  Subjective   Patient ID: Kayla [name], female    DOB: 1970/09/19  Age: 51 y.o. MRN: [id number]  Chief Complaint  Patient presents with   Mass    Pateint complains of neck mass, x1 day   Cough    Patient complains of cough, Productive cough,     HPI   Kayla [name] is seen with acute swelling right side of neck submandibular region of 1 day duration.  She woke up with swelling this morning and states the swelling is actually down considerably compared to this morning.  She had some soreness.  No fevers or chills.  Also relates some nonproductive cough 1 day duration.  No dyspnea.  She quit smoking back in November.  She has no difficulty swallowing.  No skin rashes.  No history of acute similar swelling in the past.  Her chronic problems include history of hypertension, obesity, past history of smoking, hyperlipidemia  Past Medical History:  Diagnosis Date   Allergy    Anemia    Arthritis    lt knee   Back pain    Breast cyst, right    Constipation    Dyspnea    Fatigue    GERD (gastroesophageal reflux disease)    Hyperlipidemia    Hypertension    Joint pain    Leg edema    Obesity    Sleep apnea    lost weight 2013 gastric sleeve, no longer a problem   Sleep disturbance    SVD (spontaneous vaginal delivery)    x 3   Tobacco use    Past Surgical History:  Procedure Laterality Date   ABDOMINAL HYSTERECTOMY     BILATERAL SALPINGECTOMY Bilateral 09/27/2013   Procedure: BILATERAL SALPINGECTOMY;  Surgeon: Lahoma Crocker, MD;  Location: [facility];  Service: Gynecology;  Laterality: Bilateral;   BREAST BIOPSY Right 06/18/2020   BREAST CYST EXCISION Right 06/18/2020   Procedure: EXCISION RIGHT BREAST CHRONICALLY INFECTED SEBACEOUS CYST;  Surgeon: Coralie Keens, MD;  Location: [facility];  Service: General;  Laterality: Right;   CESAREAN SECTION     x 1   COLONOSCOPY  11/12/2020   2016   CYSTO WITH HYDRODISTENSION  N/A 09/30/2019   Procedure: CYSTOSCOPY/HYDRODISTENSION;  Surgeon: Bjorn Loser, MD;  Location: [facility];  Service: Urology;  Laterality: N/A;  fulgeration   CYSTOSCOPY WITH BIOPSY N/A 09/30/2019   Procedure: CYSTOSCOPY WITH BLADDER BIOPSY;  Surgeon: Bjorn Loser, MD;  Location: [facility];  Service: Urology;  Laterality: N/A;   HAND FUSION     LAPAROTOMY Left 09/27/2013   Procedure: EXPLORATORY LAPAROTOMY W/ EXPLORATION OF LEFT ILLIAC ARTERY & VEIN;  Surgeon: Lahoma Crocker, MD;  Location: [facility];  Service: Gynecology;  Laterality: Left;   ROBOTIC ASSISTED TOTAL HYSTERECTOMY N/A 09/27/2013   Procedure: ROBOTIC ASSISTED TOTAL HYSTERECTOMY;  Surgeon: Lahoma Crocker, MD;  Location: [facility];  Service: Gynecology;  Laterality: N/A;   SLEEVE GASTROPLASTY  2013   in [place]    reports that she quit smoking about 19 months ago. Her smoking use included cigarettes. She has a 25.00 pack-year smoking history. She uses smokeless tobacco. She reports current alcohol use. She reports that she does not use drugs. family history includes Colon cancer in her maternal aunt; Colon cancer (age of onset: 28) in her maternal grandfather; Colon polyps in her maternal aunt, maternal uncle, and mother; Hyperlipidemia in her mother; Hypertension in her mother; Leukemia in her paternal grandmother; Stomach cancer in her  maternal aunt. No Known Allergies  Review of Systems  Constitutional:  Negative for chills, fever and malaise/fatigue.  HENT:  Negative for sore throat.   Eyes:  Negative for blurred vision.  Respiratory:  Positive for cough. Negative for hemoptysis and shortness of breath.   Cardiovascular:  Negative for chest pain.  Neurological:  Negative for dizziness, weakness and headaches.      Objective:     BP 114/74 (BP Location: Left Arm, Patient Position: Sitting, Cuff Size: Large)   Pulse (!) 105   Temp (!) 97.5 F (36.4 C) (Oral)   Ht '5\' 6"'$  (1.676 m)   Wt 214 lb 14.4 oz (97.5 kg)   LMP  08/02/2013   SpO2 99%   BMI 34.69 kg/m    Physical Exam Vitals reviewed.  Constitutional:      General: She is not in acute distress.    Appearance: She is not ill-appearing.  HENT:     Head: Normocephalic and atraumatic.     Comments: No right parotid swelling.    Right Ear: Tympanic membrane, ear canal and external ear normal.     Left Ear: Tympanic membrane, ear canal and external ear normal.     Mouth/Throat:     Pharynx: No oropharyngeal exudate or posterior oropharyngeal erythema.     Comments: She has poor dentition generally but no evidence for acute gum erythema or obvious gum abscess Neck:     Comments: She has some obvious swelling right-sided neck right submandibular region.  No warmth.  No erythema.  Slightly tender to palpation.  No posterior cervical triangle nodes. Cardiovascular:     Rate and Rhythm: Normal rate.  Pulmonary:     Effort: Pulmonary effort is normal.     Breath sounds: Normal breath sounds. No wheezing or rales.  Neurological:     Mental Status: She is alert.      No results found for any visits on 04/18/22.    The 10-year ASCVD risk score (Arnett DK, et al., 2019) is: 11%    Assessment & Plan:   Acute swelling right submandibular region.  Onset this morning and she states this has gone down substantially. ?  Submandibular duct stone or sludge.  Doubt dental abscess  -Recommend good hydration -Consider lemon drops to stimulate saliva production -Start Augmentin 875 mg twice daily for 10 days -Follow-up immediately for any fever, increased redness, or progressive swelling -Recommend follow-up with primary or me within 1 week if not improving  Carolann Littler, MD

## 2022-06-13 ENCOUNTER — Other Ambulatory Visit (HOSPITAL_COMMUNITY): Payer: Self-pay

## 2022-06-13 MED ORDER — HYDROCODONE-ACETAMINOPHEN 5-325 MG PO TABS
1.0000 | ORAL_TABLET | Freq: Four times a day (QID) | ORAL | 0 refills | Status: DC | PRN
  Filled 2022-06-13: qty 10, 3d supply, fill #0

## 2022-06-13 MED ORDER — AMOXICILLIN 500 MG PO CAPS
500.0000 mg | ORAL_CAPSULE | Freq: Three times a day (TID) | ORAL | 0 refills | Status: DC
  Filled 2022-06-13: qty 21, 7d supply, fill #0

## 2022-09-02 NOTE — Progress Notes (Unsigned)
ACUTE VISIT No chief complaint on file.  HPI: [name] is a 52 y.o. female, who is here today complaining of *** HPI  Review of Systems See other pertinent positives and negatives in HPI.  Current Outpatient Medications on File Prior to Visit  Medication Sig Dispense Refill   amLODipine (NORVASC) 2.5 MG tablet TAKE 1 TABLET BY MOUTH DAILY AT 2 PM. 90 tablet 2   amoxicillin (AMOXIL) 500 MG capsule Take 1 capsule (500 mg total) by mouth 3 (three) times daily until finished 21 capsule 0   amoxicillin-clavulanate (AUGMENTIN) 875-125 MG tablet Take 1 tablet by mouth 2 (two) times daily. 20 tablet 0   cetirizine (ZYRTEC) 10 MG tablet Take 1 tablet (10 mg total) by mouth daily. (Patient taking differently: Take 10 mg by mouth daily as needed for allergies.) 30 tablet 11   hydrochlorothiazide (HYDRODIURIL) 25 MG tablet TAKE 1 TABLET (25 MG TOTAL) BY MOUTH DAILY. 90 tablet 2   HYDROcodone-acetaminophen (NORCO/VICODIN) 5-325 MG tablet Take 1 tablet by mouth every 6 (six) hours as needed for pain 10 tablet 0   Insulin Pen Needle 32G X 4 MM MISC Use as directed with Saxenda daily 100 each 1   LIDOCAINE EX Apply 1 application topically 4 (four) times daily as needed (back pain.). OTC     lubiprostone (AMITIZA) 8 MCG capsule Take 1 capsule by mouth 2 times daily with a meal. 60 capsule 0   omeprazole (PRILOSEC) 40 MG capsule TAKE 1 CAPSULE BY MOUTH DAILY. 30 capsule 3   OVER THE COUNTER MEDICATION Take 1 capsule by mouth daily. [place]     pravastatin (PRAVACHOL) 10 MG tablet Take 1 tablet (10 mg total) by mouth daily. 90 tablet 3   No current facility-administered medications on file prior to visit.    Past Medical History:  Diagnosis Date   Allergy    Anemia    Arthritis    lt knee   Back pain    Breast cyst, right    Constipation    Dyspnea    Fatigue    GERD (gastroesophageal reflux disease)    Hyperlipidemia    Hypertension    Joint pain    Leg edema    Obesity     Sleep apnea    lost weight 2013 gastric sleeve, no longer a problem   Sleep disturbance    SVD (spontaneous vaginal delivery)    x 3   Tobacco use    No Known Allergies  Social History   Socioeconomic History   Marital status: Married    Spouse name: Leilani Merl   Number of children: 4   Years of education: Not on file   Highest education level: Not on file  Occupational History   Not on file  Tobacco Use   Smoking status: Former    Packs/day: 1.00    Years: 25.00    Additional pack years: 0.00    Total pack years: 25.00    Types: Cigarettes    Quit date: 08/27/2020    Years since quitting: 2.0   Smokeless tobacco: Current  Vaping Use   Vaping Use: Never used  Substance and Sexual Activity   Alcohol use: Yes    Alcohol/week: 0.0 standard drinks of alcohol    Comment: socially   Drug use: No   Sexual activity: Yes    Partners: Male    Birth control/protection: Surgical  Other Topics Concern   Not on file  Social History  Narrative   Work or School: Corporate treasurer, Copywriter, advertising Situation: lives with husband and 59 yo sone in 2016      Spiritual Beliefs: Christian      Lifestyle: no regular exercise, poor diet      Social Determinants of Health   Financial Resource Strain: Not on file  Food Insecurity: Not on file  Transportation Needs: Not on file  Physical Activity: Not on file  Stress: Not on file  Social Connections: Not on file    There were no vitals filed for this visit. There is no height or weight on file to calculate BMI.  Physical Exam  ASSESSMENT AND PLAN: There are no diagnoses linked to this encounter.  No follow-ups on file.  Shandrika Ambers G. [place], MD  [facility]. [place] office.  Discharge Instructions   None

## 2022-09-05 ENCOUNTER — Other Ambulatory Visit (HOSPITAL_COMMUNITY): Payer: Self-pay

## 2022-09-05 ENCOUNTER — Encounter: Payer: Self-pay | Admitting: Family Medicine

## 2022-09-05 ENCOUNTER — Ambulatory Visit (INDEPENDENT_AMBULATORY_CARE_PROVIDER_SITE_OTHER): Payer: Federal, State, Local not specified - PPO | Admitting: Family Medicine

## 2022-09-05 VITALS — BP 120/86 | HR 97 | Temp 97.9°F | Resp 16 | Ht 66.0 in | Wt 210.0 lb

## 2022-09-05 DIAGNOSIS — Z122 Encounter for screening for malignant neoplasm of respiratory organs: Secondary | ICD-10-CM

## 2022-09-05 DIAGNOSIS — R002 Palpitations: Secondary | ICD-10-CM

## 2022-09-05 DIAGNOSIS — I10 Essential (primary) hypertension: Secondary | ICD-10-CM | POA: Diagnosis not present

## 2022-09-05 DIAGNOSIS — E119 Type 2 diabetes mellitus without complications: Secondary | ICD-10-CM

## 2022-09-05 DIAGNOSIS — Z1239 Encounter for other screening for malignant neoplasm of breast: Secondary | ICD-10-CM

## 2022-09-05 LAB — CBC
HCT: 39.5 % (ref 36.0–46.0)
Hemoglobin: 12.8 g/dL (ref 12.0–15.0)
MCHC: 32.4 g/dL (ref 30.0–36.0)
MCV: 85.2 fl (ref 78.0–100.0)
Platelets: 296 10*3/uL (ref 150.0–400.0)
RBC: 4.64 Mil/uL (ref 3.87–5.11)
RDW: 13.6 % (ref 11.5–15.5)
WBC: 7.7 10*3/uL (ref 4.0–10.5)

## 2022-09-05 LAB — BASIC METABOLIC PANEL
BUN: 18 mg/dL (ref 6–23)
CO2: 27 mEq/L (ref 19–32)
Calcium: 9.3 mg/dL (ref 8.4–10.5)
Chloride: 103 mEq/L (ref 96–112)
Creatinine, Ser: 0.84 mg/dL (ref 0.40–1.20)
GFR: 78.88 mL/min (ref >60.00)
Glucose, Bld: 77 mg/dL (ref 70–99)
Potassium: 4.1 mEq/L (ref 3.5–5.1)
Sodium: 138 mEq/L (ref 135–145)

## 2022-09-05 LAB — HEMOGLOBIN A1C: Hgb A1c MFr Bld: 5.8 % (ref 4.6–6.5)

## 2022-09-05 LAB — TSH: TSH: 2.87 u[IU]/mL (ref 0.35–5.50)

## 2022-09-05 MED ORDER — AMLODIPINE BESYLATE 2.5 MG PO TABS
2.5000 mg | ORAL_TABLET | Freq: Every day | ORAL | 0 refills | Status: DC
  Filled 2022-09-05: qty 90, 90d supply, fill #0

## 2022-09-05 NOTE — Assessment & Plan Note (Addendum)
DBP mildly elevated. She has not been on her antihypertensive medications for a few months and not checking BP's at home. For now recommend resuming amlodipine 2.5 mg daily. Recommend monitoring BP at home. Instructed about warning signs. Eye exam is current.

## 2022-09-05 NOTE — Patient Instructions (Addendum)
A few things to remember from today's visit:  Palpitations - Plan: Basic metabolic panel, CBC, Hemoglobin A1c, TSH, EKG 12-Lead, Ambulatory referral to Cardiology  Type 2 diabetes mellitus in remission [facility]), Chronic  Essential hypertension - Plan: Basic metabolic panel, amLODipine (NORVASC) 2.5 MG tablet  Screening for lung cancer - Plan: Ambulatory Referral for Lung Cancer Scre  Encounter for screening for malignant neoplasm of breast, unspecified screening modality - Plan: MM 3D SCREENING MAMMOGRAM BILATERAL BREAST Today well-controlled resume amlodipine 2.5 mg daily. Monitor blood pressure at home as well as heart rate. Appointment with cardiology will be arranged.  If you need refills for medications you take chronically, please call your pharmacy. Do not use My Chart to request refills or for acute issues that need immediate attention. If you send a my chart message, it may take a few days to be addressed, specially if I am not in the office.  Please be sure medication list is accurate. If a new problem present, please set up appointment sooner than planned today.

## 2022-09-05 NOTE — Assessment & Plan Note (Signed)
HgA1C has been at goal, last one 6.3 in 11/2021. Continue non pharmacologic treatment. Annual eye exam, periodic dental and foot care recommended. F/U in 5-6 months.

## 2022-09-14 ENCOUNTER — Telehealth: Payer: Self-pay

## 2022-09-14 ENCOUNTER — Other Ambulatory Visit: Payer: Self-pay | Admitting: *Deleted

## 2022-09-14 DIAGNOSIS — Z87891 Personal history of nicotine dependence: Secondary | ICD-10-CM

## 2022-09-14 DIAGNOSIS — Z122 Encounter for screening for malignant neoplasm of respiratory organs: Secondary | ICD-10-CM

## 2022-09-14 NOTE — Telephone Encounter (Signed)
Returned call for scheduling LDCT. No answer.  Left VM and call back number.

## 2022-10-04 ENCOUNTER — Ambulatory Visit (INDEPENDENT_AMBULATORY_CARE_PROVIDER_SITE_OTHER): Payer: Federal, State, Local not specified - PPO | Admitting: Internal Medicine

## 2022-10-04 ENCOUNTER — Encounter: Payer: Self-pay | Admitting: Internal Medicine

## 2022-10-04 ENCOUNTER — Other Ambulatory Visit (HOSPITAL_COMMUNITY): Payer: Self-pay

## 2022-10-04 VITALS — BP 90/70 | HR 110 | Temp 97.4°F | Ht 66.0 in | Wt 207.0 lb

## 2022-10-04 DIAGNOSIS — R300 Dysuria: Secondary | ICD-10-CM | POA: Diagnosis not present

## 2022-10-04 DIAGNOSIS — K5900 Constipation, unspecified: Secondary | ICD-10-CM

## 2022-10-04 LAB — POCT URINALYSIS DIPSTICK
Bilirubin, UA: POSITIVE
Blood, UA: NEGATIVE
Glucose, UA: NEGATIVE
Ketones, UA: POSITIVE
Leukocytes, UA: NEGATIVE
Nitrite, UA: NEGATIVE
Protein, UA: POSITIVE — AB
Spec Grav, UA: 1.02 (ref 1.010–1.025)
Urobilinogen, UA: 0.2 E.U./dL
pH, UA: 6 (ref 5.0–8.0)

## 2022-10-04 MED ORDER — NITROFURANTOIN MONOHYD MACRO 100 MG PO CAPS
100.0000 mg | ORAL_CAPSULE | Freq: Two times a day (BID) | ORAL | 0 refills | Status: AC
  Filled 2022-10-04: qty 14, 7d supply, fill #0

## 2022-10-04 NOTE — Patient Instructions (Addendum)
Checking  urine culture to see if bacteria adding causing uti  Will send in antibiotic.that cures most UTIs .  In interim  begin miralax daily 1 capful for constipation  and lots of fluids   . There are other rx  I would have you followup with Dr [place] ? Trying other for the constipation problems   Make sure you are up to date on colon cancer screening

## 2022-10-04 NOTE — Progress Notes (Signed)
Chief Complaint  Patient presents with   Dysuria    Sx started on last Friday.     HPI: ROSHELL [name] 52 y.o. come in for sda   for lower pelvic abd pain and dysusria urgency  Initially diarrhea 5 days ago not bad and then had fatigue and then 3 days ago  felt constipated  as typical . Then 2 days felt better until evening and then noted  urgency and then cramping in pelvic area  about 36 hours ago and then worse .   Feel like   uti.. has remote hx of uti recurrent rx byurologist suppressive rx but hasn't had one in a while.  Battles constipation  but on nothing today .  Fam hx of colon cancer  may be due for next colon soon. ROS: See pertinent positives and negatives per HPI. No fever chills vomiting  Past Medical History:  Diagnosis Date   Allergy    Anemia    Arthritis    lt knee   Back pain    Breast cyst, right    Constipation    Dyspnea    Fatigue    GERD (gastroesophageal reflux disease)    Hyperlipidemia    Hypertension    Joint pain    Leg edema    Obesity    Sleep apnea    lost weight 2013 gastric sleeve, no longer a problem   Sleep disturbance    SVD (spontaneous vaginal delivery)    x 3   Tobacco use     Family History  Problem Relation Age of Onset   Hypertension Mother    Colon polyps Mother    Hyperlipidemia Mother    Colon cancer Maternal Grandfather 80   Colon cancer Maternal Aunt        onset in 35s   Stomach cancer Maternal Aunt    Leukemia Paternal Grandmother    Colon polyps Maternal Uncle    Colon polyps Maternal Aunt     Social History   Socioeconomic History   Marital status: Married    Spouse name: Kayla [name]   Number of children: 4   Years of education: Not on file   Highest education level: Not on file  Occupational History   Not on file  Tobacco Use   Smoking status: Former    Packs/day: 1.00    Years: 25.00    Additional pack years: 0.00    Total pack years: 25.00    Types: Cigarettes    Quit date: 08/27/2020     Years since quitting: 2.1   Smokeless tobacco: Current  Vaping Use   Vaping Use: Never used  Substance and Sexual Activity   Alcohol use: Yes    Alcohol/week: 0.0 standard drinks of alcohol    Comment: socially   Drug use: No   Sexual activity: Yes    Partners: Male    Birth control/protection: Surgical  Other Topics Concern   Not on file  Social History Narrative   Work or School: Corporate treasurer, Geneticist, molecular      Home Situation: lives with husband and 72 yo sone in 2016      Spiritual Beliefs: Christian      Lifestyle: no regular exercise, poor diet      Social Determinants of Health   Financial Resource Strain: Not on file  Food Insecurity: Not on file  Transportation Needs: Not on file  Physical Activity: Not on file  Stress: Not on file  Social  Connections: Not on file    Outpatient Medications Prior to Visit  Medication Sig Dispense Refill   amLODipine (NORVASC) 2.5 MG tablet Take 1 tablet (2.5 mg total) by mouth daily. 90 tablet 0   cetirizine (ZYRTEC) 10 MG tablet Take 1 tablet (10 mg total) by mouth daily. (Patient taking differently: Take 10 mg by mouth daily as needed for allergies.) 30 tablet 11   omeprazole (PRILOSEC) 40 MG capsule TAKE 1 CAPSULE BY MOUTH DAILY. (Patient not taking: Reported on 09/05/2022) 30 capsule 3   pravastatin (PRAVACHOL) 10 MG tablet Take 1 tablet (10 mg total) by mouth daily. (Patient not taking: Reported on 09/05/2022) 90 tablet 3   No facility-administered medications prior to visit.     EXAM:  BP 90/70 (BP Location: Left Arm, Patient Position: Sitting, Cuff Size: Large)   Pulse (!) 110   Temp (!) 97.4 F (36.3 C) (Oral)   Ht 5\' 6"  (1.676 m)   Wt 207 lb (93.9 kg)   LMP 08/02/2013   SpO2 98%   BMI 33.41 kg/m   Body mass index is 33.41 kg/m.  GENERAL: vitals reviewed and listed above, alert, oriented, appears well hydrated and in no acute distress HEENT: atraumatic, conjunctiva  clear, no obvious abnormalities on  inspection of external nose and ears  NECK: no obvious masses on inspection palpation   CV: HRRR, no clubbing cyanosis or  peripheral edema nl cap refill  Abdomen:  Sof,t normal bowel sounds without hepatosplenomegaly, no guarding rebound or masses no CVA tenderness tender over suprapubic area no masses MS: moves all extremities without noticeable focal  abnormality antalgic gait ( back)  PSYCH: pleasant and cooperative, no obvious depression or anxiety Lab Results  Component Value Date   WBC 7.7 09/05/2022   HGB 12.8 09/05/2022   HCT 39.5 09/05/2022   PLT 296.0 09/05/2022   GLUCOSE 77 09/05/2022   CHOL 130 11/08/2021   TRIG 59.0 11/08/2021   HDL 44.30 11/08/2021   LDLCALC 74 11/08/2021   ALT 9 11/08/2021   AST 10 11/08/2021   NA 138 09/05/2022   K 4.1 09/05/2022   CL 103 09/05/2022   CREATININE 0.84 09/05/2022   BUN 18 09/05/2022   CO2 27 09/05/2022   TSH 2.87 09/05/2022   HGBA1C 5.8 09/05/2022   MICROALBUR 1.3 11/08/2021   BP Readings from Last 3 Encounters:  10/04/22 90/70  09/05/22 120/86  04/18/22 114/74  Ua neg leuk ( past notes seen Dr Shela Commons July 23   ASSESSMENT AND PLAN:  Discussed the following assessment and plan:  Dysuria - Plan: POC Urinalysis Dipstick, Urine Culture  Constipation, unspecified constipation type Options discussed  Empiric rx macrobid pending urine cx and rx for constipation  Get back on miralax and fluids and fu   -Patient advised to return or notify health care team  if  new concerns arise.  Patient Instructions  Checking  urine culture to see if bacteria adding causing uti  Will send in antibiotic.that cures most UTIs .  In interim  begin miralax daily 1 capful for constipation  and lots of fluids   . There are other rx  I would have you followup with Dr [place] ? Trying other for the constipation problems   Make sure you are up to date on colon cancer screening    [name] M.D.

## 2022-10-06 LAB — URINE CULTURE
MICRO NUMBER:: 15014427
Result:: NO GROWTH
SPECIMEN QUALITY:: ADEQUATE

## 2022-10-09 NOTE — Progress Notes (Signed)
Urine culture is negative for bacteria  . If  persistent or progressive  suggest followup visit with PCP or your urologist

## 2022-10-11 ENCOUNTER — Ambulatory Visit
Admission: RE | Admit: 2022-10-11 | Discharge: 2022-10-11 | Disposition: A | Discharge status: Home / Self Care | Payer: Federal, State, Local not specified - PPO | Source: Ambulatory Visit | Attending: Acute Care | Admitting: Acute Care

## 2022-10-11 ENCOUNTER — Ambulatory Visit
Admission: RE | Admit: 2022-10-11 | Discharge: 2022-10-11 | Disposition: A | Discharge status: Home / Self Care | Payer: Federal, State, Local not specified - PPO | Source: Ambulatory Visit | Attending: Family Medicine | Admitting: Family Medicine

## 2022-10-11 ENCOUNTER — Telehealth: Payer: Self-pay | Admitting: Family Medicine

## 2022-10-11 DIAGNOSIS — Z1231 Encounter for screening mammogram for malignant neoplasm of breast: Secondary | ICD-10-CM | POA: Diagnosis not present

## 2022-10-11 DIAGNOSIS — Z122 Encounter for screening for malignant neoplasm of respiratory organs: Secondary | ICD-10-CM

## 2022-10-11 DIAGNOSIS — Z1239 Encounter for other screening for malignant neoplasm of breast: Secondary | ICD-10-CM

## 2022-10-11 DIAGNOSIS — Z87891 Personal history of nicotine dependence: Secondary | ICD-10-CM

## 2022-10-11 DIAGNOSIS — F17210 Nicotine dependence, cigarettes, uncomplicated: Secondary | ICD-10-CM | POA: Diagnosis not present

## 2022-10-11 NOTE — Telephone Encounter (Signed)
Spoke to pt.  Pt request a referral to [facility]. She states she has sx same as before, few years ago and they had to do a procedure in 2021.  Okay to place a referral?

## 2022-10-11 NOTE — Telephone Encounter (Signed)
Saw Panosh 10/04/22 for bladder issues, says a referral to urology was discussed. Requesting referral and a call to let her know when it has been placed.

## 2022-10-12 NOTE — Telephone Encounter (Signed)
Ok to do referral to urology for recurrent urinary symptoms but pdp I s Dr [place]

## 2022-10-18 ENCOUNTER — Other Ambulatory Visit: Payer: Self-pay | Admitting: Acute Care

## 2022-10-18 ENCOUNTER — Telehealth: Payer: Self-pay | Admitting: Family Medicine

## 2022-10-18 DIAGNOSIS — Z87891 Personal history of nicotine dependence: Secondary | ICD-10-CM

## 2022-10-18 DIAGNOSIS — Z122 Encounter for screening for malignant neoplasm of respiratory organs: Secondary | ICD-10-CM

## 2022-10-18 DIAGNOSIS — Z1211 Encounter for screening for malignant neoplasm of colon: Secondary | ICD-10-CM

## 2022-10-18 DIAGNOSIS — F17210 Nicotine dependence, cigarettes, uncomplicated: Secondary | ICD-10-CM

## 2022-10-18 NOTE — Telephone Encounter (Signed)
Requesting referral for colonoscopy

## 2022-10-18 NOTE — Telephone Encounter (Signed)
Referral placed.

## 2022-11-04 ENCOUNTER — Ambulatory Visit: Payer: Federal, State, Local not specified - PPO | Attending: Internal Medicine

## 2022-11-04 ENCOUNTER — Ambulatory Visit: Payer: Federal, State, Local not specified - PPO | Attending: Internal Medicine | Admitting: Internal Medicine

## 2022-11-04 ENCOUNTER — Encounter: Payer: Self-pay | Admitting: Internal Medicine

## 2022-11-04 VITALS — BP 129/88 | HR 101 | Ht 66.0 in | Wt 207.8 lb

## 2022-11-04 DIAGNOSIS — E119 Type 2 diabetes mellitus without complications: Secondary | ICD-10-CM | POA: Diagnosis not present

## 2022-11-04 DIAGNOSIS — R002 Palpitations: Secondary | ICD-10-CM | POA: Diagnosis not present

## 2022-11-04 DIAGNOSIS — I10 Essential (primary) hypertension: Secondary | ICD-10-CM | POA: Diagnosis not present

## 2022-11-04 DIAGNOSIS — F17200 Nicotine dependence, unspecified, uncomplicated: Secondary | ICD-10-CM | POA: Diagnosis not present

## 2022-11-04 DIAGNOSIS — E785 Hyperlipidemia, unspecified: Secondary | ICD-10-CM

## 2022-11-04 DIAGNOSIS — M170 Bilateral primary osteoarthritis of knee: Secondary | ICD-10-CM | POA: Diagnosis not present

## 2022-11-04 NOTE — Progress Notes (Unsigned)
Enrolled patient for a 7 day Zio XT monitor to be mailed to patients home.  

## 2022-11-04 NOTE — Patient Instructions (Signed)
Testing/Procedures:  Your physician has requested that you have an echocardiogram. Echocardiography is a painless test that uses sound waves to create images of your heart. It provides your doctor with information about the size and shape of your heart and how well your heart's chambers and valves are working. This procedure takes approximately one hour. There are no restrictions for this procedure. Please do NOT wear cologne, perfume, aftershave, or lotions (deodorant is allowed). Please arrive 15 minutes prior to your appointment time. [address]   ZIO XT- Long Term Monitor Instructions  Your physician has requested you wear a ZIO patch monitor for 7 days.  This is a single patch monitor. [employer] supplies one patch monitor per enrollment. Additional stickers are not available. Please do not apply patch if you will be having a Nuclear Stress Test,  Echocardiogram, Cardiac CT, MRI, or Chest Xray during the period you would be wearing the  monitor. The patch cannot be worn during these tests. You cannot remove and re-apply the  ZIO XT patch monitor.  Your ZIO patch monitor will be mailed 3 day USPS to your address on file. It may take 3-5 days  to receive your monitor after you have been enrolled.  Once you have received your monitor, please review the enclosed instructions. Your monitor  has already been registered assigning a specific monitor serial # to you.  Billing and Patient Assistance Program Information  We have supplied [employer] with any of your insurance information on file for billing purposes. [employer] offers a sliding scale Patient Assistance Program for patients that do not have  insurance, or whose insurance does not completely cover the cost of the ZIO monitor.  You must apply for the Patient Assistance Program to qualify for this discounted rate.  To apply, please call [employer] at [phone], select option 4, select option 2, ask to apply for  Patient  Assistance Program. Meredeth Ide will ask your household income, and how many people  are in your household. They will quote your out-of-pocket cost based on that information.  [employer] will also be able to set up a 30-month, interest-free payment plan if needed.  Applying the monitor   Shave hair from upper left chest.  Hold abrader disc by orange tab. Rub abrader in 40 strokes over the upper left chest as  indicated in your monitor instructions.  Clean area with 4 enclosed alcohol pads. Let dry.  Apply patch as indicated in monitor instructions. Patch will be placed under collarbone on left  side of chest with arrow pointing upward.  Rub patch adhesive wings for 2 minutes. Remove white label marked "1". Remove the white  label marked "2". Rub patch adhesive wings for 2 additional minutes.  While looking in a mirror, press and release button in center of patch. A small green light will  flash 3-4 times. This will be your only indicator that the monitor has been turned on.  Do not shower for the first 24 hours. You may shower after the first 24 hours.  Press the button if you feel a symptom. You will hear a small click. Record Date, Time and  Symptom in the Patient Logbook.  When you are ready to remove the patch, follow instructions on the last 2 pages of Patient  Logbook. Stick patch monitor onto the last page of Patient Logbook.  Place Patient Logbook in the blue and white box. Use locking tab on box and tape box closed  securely. The blue  and white box has prepaid postage on it. Please place it in the mailbox as  soon as possible. Your physician should have your test results approximately 7 days after the  monitor has been mailed back to [facility].  Call [facility] Customer Care at [phone] if you have questions regarding  your ZIO XT patch monitor. Call them immediately if you see an orange light blinking on your  monitor.  If your monitor falls off in less than 4 days,  contact our Monitor department at [phone].  If your monitor becomes loose or falls off after 4 days call [employer] at [phone] for  suggestions on securing your monitor    Follow-Up: At [facility], you and your health needs are our priority.  As part of our continuing mission to provide you with exceptional heart care, we have created designated Provider Care Teams.  These Care Teams include your primary Cardiologist (physician) and Advanced Practice Providers (APPs -  Physician Assistants and Nurse Practitioners) who all work together to provide you with the care you need, when you need it.  We recommend signing up for the patient portal called "MyChart".  Sign up information is provided on this After Visit Summary.  MyChart is used to connect with patients for Virtual Visits (Telemedicine).  Patients are able to view lab/test results, encounter notes, upcoming appointments, etc.  Non-urgent messages can be sent to your provider as well.   To learn more about what you can do with MyChart, go to [email or web address].    Your next appointment:   1 month(s)  Provider:   Weston Brass MD

## 2022-11-04 NOTE — Progress Notes (Signed)
Cardiology Office Note:    Date:  11/04/2022   ID:  Kayla [name], DOB [birth year], MRN [id number]  PCP:  [place], Betty G, MD  Cardiologist:  None  Electrophysiologist:  None   Referring MD: [place], Betty G, MD   Chief Complaint/Reason for Referral: Palpitations  History of Present Illness:    Kayla [name] is a 52 y.o. female with a history of diabetes mellitus type 2, hypertension, hyperlipidemia, presents for evaluation of palpitations.  She currently works in Research scientist (medical) at [employer] and does have quite a bit of walking as part of her job.  On the weeks when she is the mobile employee she can walk up to 8 miles in a day.  She will return home quite tired and feels the need to lay down soon after returning home.  That is when she will experience some palpitations and it feels like she has been running.  She mentioned this to her primary care doctor in April and noticed that in the last couple of months it has increased.  She will now notice palpitations while getting dressed and even getting up on the exam table for the EKG she became slightly breathless and had palpitations.  She did for a brief period of time stop smoking but then has since resumed.  She did not tolerate Chantix as part of her smoking cessation therapy due to altered mentation.  She does have some shortness of breath with ambulation but denies chest pain.  She has some chest discomfort which she attributes to musculoskeletal discomfort from wearing 2 bras.  We reviewed her prior testing together in the office.  She had a lung cancer screening CT performed on 10/17/2022, we reviewed these images and note that there is no coronary artery calcifications.  In addition she had a lipid panel performed July 2023 which showed an LDL of 74 and triglycerides of 59.  This is on pravastatin for lipid-lowering therapy.  She is concerned that her palpitations may represent the beginnings of heart disease that seems to run in her  family.  Her maternal grandfather had an MI in his 66s and was a smoker who eventually quit smoking.  Her grandmother had heart disease of unknown type to the patient as well as sickle cell and was also a smoker and lived to 74 elderly age.  Her maternal aunt had heart disease as well.  Her maternal aunt son (her cousin) is approximately her age and has already had coronary artery bypass grafting x 3.  The patient denies chest pressure, dyspnea at rest, PND, orthopnea, or leg swelling. Denies cough, fever, chills. Denies nausea, vomiting. Denies syncope or presyncope. Denies dizziness or lightheadedness.    Past Medical History:  Diagnosis Date   Allergy    Anemia    Arthritis    lt knee   Back pain    Breast cyst, right    Constipation    Dyspnea    Fatigue    GERD (gastroesophageal reflux disease)    Hyperlipidemia    Hypertension    Joint pain    Leg edema    Obesity    Sleep apnea    lost weight 2013 gastric sleeve, no longer a problem   Sleep disturbance    SVD (spontaneous vaginal delivery)    x 3   Tobacco use     Past Surgical History:  Procedure Laterality Date   ABDOMINAL HYSTERECTOMY     BILATERAL SALPINGECTOMY Bilateral 09/27/2013  Procedure: BILATERAL SALPINGECTOMY;  Surgeon: Antionette Char, MD;  Location: [facility];  Service: Gynecology;  Laterality: Bilateral;   BREAST BIOPSY Right 06/18/2020   BREAST CYST EXCISION Right 06/18/2020   Procedure: EXCISION RIGHT BREAST CHRONICALLY INFECTED SEBACEOUS CYST;  Surgeon: Abigail Miyamoto, MD;  Location: [facility];  Service: General;  Laterality: Right;   CESAREAN SECTION     x 1   COLONOSCOPY  11/12/2020   2016   CYSTO WITH HYDRODISTENSION N/A 09/30/2019   Procedure: CYSTOSCOPY/HYDRODISTENSION;  Surgeon: Alfredo Martinez, MD;  Location: [facility];  Service: Urology;  Laterality: N/A;  fulgeration   CYSTOSCOPY WITH BIOPSY N/A 09/30/2019   Procedure: CYSTOSCOPY WITH BLADDER BIOPSY;  Surgeon:  Alfredo Martinez, MD;  Location: [facility];  Service: Urology;  Laterality: N/A;   HAND FUSION     LAPAROTOMY Left 09/27/2013   Procedure: EXPLORATORY LAPAROTOMY W/ EXPLORATION OF LEFT ILLIAC ARTERY & VEIN;  Surgeon: Antionette Char, MD;  Location: [facility];  Service: Gynecology;  Laterality: Left;   ROBOTIC ASSISTED TOTAL HYSTERECTOMY N/A 09/27/2013   Procedure: ROBOTIC ASSISTED TOTAL HYSTERECTOMY;  Surgeon: Antionette Char, MD;  Location: [facility];  Service: Gynecology;  Laterality: N/A;   SLEEVE GASTROPLASTY  2013   in [place]    Current Medications: Current Meds  Medication Sig   amLODipine (NORVASC) 2.5 MG tablet Take 1 tablet (2.5 mg total) by mouth daily.   cetirizine (ZYRTEC) 10 MG tablet Take 1 tablet (10 mg total) by mouth daily. (Patient taking differently: Take 10 mg by mouth daily as needed for allergies.)     Allergies:   Patient has no known allergies.   Social History   Tobacco Use   Smoking status: Former    Packs/day: 1.00    Years: 25.00    Additional pack years: 0.00    Total pack years: 25.00    Types: Cigarettes    Quit date: 08/27/2020    Years since quitting: 2.1   Smokeless tobacco: Current  Vaping Use   Vaping Use: Never used  Substance Use Topics   Alcohol use: Yes    Alcohol/week: 0.0 standard drinks of alcohol    Comment: socially   Drug use: No     Family History: The patient's family history includes Colon cancer in her maternal aunt; Colon cancer (age of onset: 74) in her maternal grandfather; Colon polyps in her maternal aunt, maternal uncle, and mother; Hyperlipidemia in her mother; Hypertension in her mother; Leukemia in her paternal grandmother; Stomach cancer in her maternal aunt.  ROS:   Please see the history of present illness.    All other systems reviewed and are negative.  EKGs/Labs/Other Studies Reviewed:    The following studies were reviewed today:  EKG:   EKG Interpretation Date/Time:  Friday November 04 2022 14:00:54  EDT Ventricular Rate:  101 PR Interval:  186 QRS Duration:  78 QT Interval:  322 QTC Calculation: 417 R Axis:   25  Text Interpretation: Sinus tachycardia Possible Left atrial enlargement When compared with ECG of 25-Sep-2019 10:05, Since last tracing rate faster Confirmed by Weston Brass ([id number]) on 11/04/2022 2:11:29 PM   Imaging studies that I have independently reviewed today: CT lung cancer screening chest 10/17/2022, no coronary artery calcification seen, minimal aortic atherosclerosis.  Recent Labs: 11/08/2021: ALT 9 09/05/2022: BUN 18; Creatinine, Ser 0.84; Hemoglobin 12.8; Platelets 296.0; Potassium 4.1; Sodium 138; TSH 2.87  Recent Lipid Panel    Component Value Date/Time   CHOL 130 11/08/2021 0927  CHOL 161 12/12/2017 0904   TRIG 59.0 11/08/2021 0927   HDL 44.30 11/08/2021 0927   HDL 53 12/12/2017 0904   CHOLHDL 3 11/08/2021 0927   VLDL 11.8 11/08/2021 0927   LDLCALC 74 11/08/2021 0927   LDLCALC 88 12/03/2019 1301    Physical Exam:    VS:  BP 129/88   Pulse (!) 101   Ht 5\' 6"  (1.676 m)   Wt 207 lb 12.8 oz (94.3 kg)   LMP 08/02/2013   SpO2 99%   BMI 33.54 kg/m     Wt Readings from Last 5 Encounters:  11/04/22 207 lb 12.8 oz (94.3 kg)  10/04/22 207 lb (93.9 kg)  09/05/22 210 lb (95.3 kg)  04/18/22 214 lb 14.4 oz (97.5 kg)  11/08/21 233 lb 6 oz (105.9 kg)    Constitutional: No acute distress Eyes: sclera non-icteric, normal conjunctiva and lids ENMT: normal dentition, moist mucous membranes Cardiovascular: regular rhythm, normal rate, no murmur. S1 and S2 normal. No jugular venous distention.  Respiratory: clear to auscultation bilaterally GI : normal bowel sounds, soft and nontender. No distention.   MSK: extremities warm, well perfused. No edema.  NEURO: grossly nonfocal exam, moves all extremities. PSYCH: alert and oriented x 3, normal mood and affect.   ASSESSMENT:    1. Palpitations   2. Essential hypertension   3. Type 2 diabetes mellitus in  remission (HCC)   4. Tobacco use disorder   5. Dyslipidemia (high LDL; low HDL)    PLAN:    Palpitations - Plan: EKG 12-Lead, LONG TERM MONITOR (3-14 DAYS), ECHOCARDIOGRAM COMPLETE  Essential hypertension  Type 2 diabetes mellitus in remission (HCC)  Tobacco use disorder  Dyslipidemia (high LDL; low HDL)  -Obtain 7-day Zio monitor for palpitations to ensure this is nothing more than sinus tachycardia.  We discussed that sinus tachycardia may be driven by cigarette smoking, she is attempting to quit. -Patient is also attempting weight loss but is not currently on any weight loss lowering drugs and stays well-hydrated.  She has previously taken phentermine she believes, I discouraged the use of this medication in the future given its propensity to cause palpitations. -Blood pressure well-controlled today on amlodipine 2.5 mg daily reasonable to continue -Lipid panel optimized as of last year on pravastatin, no coronary calcifications on recent lung cancer screening CT, continue current therapy.  Return visit for result review.   Weston Brass, MD, [facility]  [facility]   Shared Decision Making/Informed Consent:       Medication Adjustments/Labs and Tests Ordered: Current medicines are reviewed at length with the patient today.  Concerns regarding medicines are outlined above.   Orders Placed This Encounter  Procedures   LONG TERM MONITOR (3-14 DAYS)   EKG 12-Lead   ECHOCARDIOGRAM COMPLETE    No orders of the defined types were placed in this encounter.   Patient Instructions     Testing/Procedures:  Your physician has requested that you have an echocardiogram. Echocardiography is a painless test that uses sound waves to create images of your heart. It provides your doctor with information about the size and shape of your heart and how well your heart's chambers and valves are working. This procedure takes approximately one hour. There are no restrictions  for this procedure. Please do NOT wear cologne, perfume, aftershave, or lotions (deodorant is allowed). Please arrive 15 minutes prior to your appointment time. [address]   ZIO XT- Long Term Monitor Instructions  Your physician has  requested you wear a ZIO patch monitor for 7 days.  This is a single patch monitor. [employer] supplies one patch monitor per enrollment. Additional stickers are not available. Please do not apply patch if you will be having a Nuclear Stress Test,  Echocardiogram, Cardiac CT, MRI, or Chest Xray during the period you would be wearing the  monitor. The patch cannot be worn during these tests. You cannot remove and re-apply the  ZIO XT patch monitor.  Your ZIO patch monitor will be mailed 3 day USPS to your address on file. It may take 3-5 days  to receive your monitor after you have been enrolled.  Once you have received your monitor, please review the enclosed instructions. Your monitor  has already been registered assigning a specific monitor serial # to you.  Billing and Patient Assistance Program Information  We have supplied [employer] with any of your insurance information on file for billing purposes. [employer] offers a sliding scale Patient Assistance Program for patients that do not have  insurance, or whose insurance does not completely cover the cost of the ZIO monitor.  You must apply for the Patient Assistance Program to qualify for this discounted rate.  To apply, please call [employer] at [phone], select option 4, select option 2, ask to apply for  Patient Assistance Program. Meredeth Ide will ask your household income, and how many people  are in your household. They will quote your out-of-pocket cost based on that information.  [employer] will also be able to set up a 87-month, interest-free payment plan if needed.  Applying the monitor   Shave hair from upper left chest.  Hold abrader disc by orange tab. Rub abrader in 40 strokes over the  upper left chest as  indicated in your monitor instructions.  Clean area with 4 enclosed alcohol pads. Let dry.  Apply patch as indicated in monitor instructions. Patch will be placed under collarbone on left  side of chest with arrow pointing upward.  Rub patch adhesive wings for 2 minutes. Remove white label marked "1". Remove the white  label marked "2". Rub patch adhesive wings for 2 additional minutes.  While looking in a mirror, press and release button in center of patch. A small green light will  flash 3-4 times. This will be your only indicator that the monitor has been turned on.  Do not shower for the first 24 hours. You may shower after the first 24 hours.  Press the button if you feel a symptom. You will hear a small click. Record Date, Time and  Symptom in the Patient Logbook.  When you are ready to remove the patch, follow instructions on the last 2 pages of Patient  Logbook. Stick patch monitor onto the last page of Patient Logbook.  Place Patient Logbook in the blue and white box. Use locking tab on box and tape box closed  securely. The blue and white box has prepaid postage on it. Please place it in the mailbox as  soon as possible. Your physician should have your test results approximately 7 days after the  monitor has been mailed back to [facility].  Call [facility] Customer Care at [phone] if you have questions regarding  your ZIO XT patch monitor. Call them immediately if you see an orange light blinking on your  monitor.  If your monitor falls off in less than 4 days, contact our Monitor department at [phone].  If your monitor becomes loose or falls off after 4 days call  [employer] at [phone] for  suggestions on securing your monitor    Follow-Up: At [facility], you and your health needs are our priority.  As part of our continuing mission to provide you with exceptional heart care, we have created designated Provider Care Teams.   These Care Teams include your primary Cardiologist (physician) and Advanced Practice Providers (APPs -  Physician Assistants and Nurse Practitioners) who all work together to provide you with the care you need, when you need it.  We recommend signing up for the patient portal called "MyChart".  Sign up information is provided on this After Visit Summary.  MyChart is used to connect with patients for Virtual Visits (Telemedicine).  Patients are able to view lab/test results, encounter notes, upcoming appointments, etc.  Non-urgent messages can be sent to your provider as well.   To learn more about what you can do with MyChart, go to [email or web address].    Your next appointment:   1 month(s)  Provider:   Weston Brass MD

## 2022-11-11 ENCOUNTER — Telehealth: Payer: Self-pay | Admitting: Family Medicine

## 2022-11-11 DIAGNOSIS — R300 Dysuria: Secondary | ICD-10-CM

## 2022-11-11 DIAGNOSIS — Z87440 Personal history of urinary (tract) infections: Secondary | ICD-10-CM

## 2022-11-11 NOTE — Telephone Encounter (Signed)
Pt saw Panosh on 10/04/22 and requested a referral to urology. Patient checking on progress of this request. Pt requesting a call to confirm

## 2022-11-14 NOTE — Addendum Note (Signed)
Addended by: Weyman Croon E on: 11/14/2022 10:09 AM   Modules accepted: Orders

## 2022-11-17 ENCOUNTER — Other Ambulatory Visit: Payer: Self-pay | Admitting: Family Medicine

## 2022-11-17 ENCOUNTER — Other Ambulatory Visit (HOSPITAL_COMMUNITY): Payer: Self-pay

## 2022-11-17 DIAGNOSIS — N3020 Other chronic cystitis without hematuria: Secondary | ICD-10-CM | POA: Diagnosis not present

## 2022-11-17 DIAGNOSIS — R350 Frequency of micturition: Secondary | ICD-10-CM | POA: Diagnosis not present

## 2022-11-17 DIAGNOSIS — R1031 Right lower quadrant pain: Secondary | ICD-10-CM | POA: Diagnosis not present

## 2022-11-17 MED ORDER — OXYBUTYNIN CHLORIDE ER 10 MG PO TB24
10.0000 mg | ORAL_TABLET | Freq: Every day | ORAL | 11 refills | Status: DC
  Filled 2022-11-17: qty 30, 30d supply, fill #0

## 2022-11-17 MED ORDER — PHENAZOPYRIDINE HCL 100 MG PO TABS
100.0000 mg | ORAL_TABLET | Freq: Two times a day (BID) | ORAL | 3 refills | Status: DC
  Filled 2022-11-17: qty 20, 10d supply, fill #0

## 2022-11-20 MED ORDER — LUBIPROSTONE 8 MCG PO CAPS
8.0000 ug | ORAL_CAPSULE | Freq: Two times a day (BID) | ORAL | 3 refills | Status: DC
  Filled 2022-11-20 – 2022-12-22 (×2): qty 60, 30d supply, fill #0

## 2022-11-21 ENCOUNTER — Other Ambulatory Visit (HOSPITAL_COMMUNITY): Payer: Self-pay

## 2022-11-29 ENCOUNTER — Other Ambulatory Visit (HOSPITAL_COMMUNITY): Payer: Self-pay

## 2022-11-30 ENCOUNTER — Ambulatory Visit (HOSPITAL_COMMUNITY): Payer: Federal, State, Local not specified - PPO | Attending: Internal Medicine

## 2022-11-30 DIAGNOSIS — R1031 Right lower quadrant pain: Secondary | ICD-10-CM | POA: Diagnosis not present

## 2022-11-30 DIAGNOSIS — R002 Palpitations: Secondary | ICD-10-CM | POA: Diagnosis not present

## 2022-11-30 DIAGNOSIS — R102 Pelvic and perineal pain: Secondary | ICD-10-CM | POA: Diagnosis not present

## 2022-11-30 LAB — ECHOCARDIOGRAM COMPLETE
P 1/2 time: 908 msec
S' Lateral: 2.4 cm

## 2022-12-22 ENCOUNTER — Other Ambulatory Visit (HOSPITAL_COMMUNITY): Payer: Self-pay

## 2022-12-22 DIAGNOSIS — R350 Frequency of micturition: Secondary | ICD-10-CM | POA: Diagnosis not present

## 2022-12-22 DIAGNOSIS — N3020 Other chronic cystitis without hematuria: Secondary | ICD-10-CM | POA: Diagnosis not present

## 2022-12-23 DIAGNOSIS — M170 Bilateral primary osteoarthritis of knee: Secondary | ICD-10-CM | POA: Diagnosis not present

## 2023-01-03 ENCOUNTER — Other Ambulatory Visit (HOSPITAL_COMMUNITY): Payer: Self-pay

## 2023-01-03 DIAGNOSIS — N3020 Other chronic cystitis without hematuria: Secondary | ICD-10-CM | POA: Diagnosis not present

## 2023-01-03 DIAGNOSIS — N3010 Interstitial cystitis (chronic) without hematuria: Secondary | ICD-10-CM | POA: Diagnosis not present

## 2023-01-03 MED ORDER — OXYCODONE-ACETAMINOPHEN 5-325 MG PO TABS
1.0000 | ORAL_TABLET | Freq: Four times a day (QID) | ORAL | 0 refills | Status: DC | PRN
  Filled 2023-01-03: qty 10, 2d supply, fill #0

## 2023-01-03 MED ORDER — CIPROFLOXACIN HCL 250 MG PO TABS
250.0000 mg | ORAL_TABLET | Freq: Two times a day (BID) | ORAL | 0 refills | Status: DC
  Filled 2023-01-03: qty 6, 3d supply, fill #0

## 2023-01-13 ENCOUNTER — Other Ambulatory Visit (HOSPITAL_COMMUNITY): Payer: Self-pay

## 2023-01-13 DIAGNOSIS — N3020 Other chronic cystitis without hematuria: Secondary | ICD-10-CM | POA: Diagnosis not present

## 2023-01-13 DIAGNOSIS — R350 Frequency of micturition: Secondary | ICD-10-CM | POA: Diagnosis not present

## 2023-01-13 MED ORDER — ELMIRON 100 MG PO CAPS
200.0000 mg | ORAL_CAPSULE | Freq: Two times a day (BID) | ORAL | 11 refills | Status: AC
  Filled 2023-01-13: qty 120, 30d supply, fill #0
  Filled 2023-08-08: qty 120, 30d supply, fill #1
  Filled 2023-11-15: qty 120, 30d supply, fill #2

## 2023-01-16 ENCOUNTER — Other Ambulatory Visit: Payer: Self-pay

## 2023-01-16 ENCOUNTER — Other Ambulatory Visit (HOSPITAL_COMMUNITY): Payer: Self-pay

## 2023-01-18 DIAGNOSIS — M170 Bilateral primary osteoarthritis of knee: Secondary | ICD-10-CM | POA: Diagnosis not present

## 2023-01-19 ENCOUNTER — Other Ambulatory Visit: Payer: Self-pay

## 2023-01-25 DIAGNOSIS — M170 Bilateral primary osteoarthritis of knee: Secondary | ICD-10-CM | POA: Diagnosis not present

## 2023-02-01 DIAGNOSIS — M170 Bilateral primary osteoarthritis of knee: Secondary | ICD-10-CM | POA: Diagnosis not present

## 2023-02-06 ENCOUNTER — Ambulatory Visit: Payer: Federal, State, Local not specified - PPO | Attending: Internal Medicine | Admitting: Internal Medicine

## 2023-02-06 NOTE — Progress Notes (Deleted)
  Cardiology Office Note:  .   Date:  02/06/2023  ID:  Kayla [name], DOB February 27, 1971, MRN [id number] PCP: [place], Betty G, MD  [facility] Providers Cardiologist:  None    History of Present Illness: .   Kayla [name] is a 52 y.o. female.  Discussed the use of AI scribe software for clinical note transcription with the patient, who gave verbal consent to proceed.  History of Present Illness            ROS: negative except per HPI above.  Studies Reviewed: .        Results         Risk Assessment/Calculations:   {Does this patient have ATRIAL FIBRILLATION?[phone]} No BP recorded.  {Refresh Note OR Click here to enter BP  :1}***       Physical Exam:   VS:  LMP 08/02/2013    Wt Readings from Last 3 Encounters:  11/04/22 207 lb 12.8 oz (94.3 kg)  10/04/22 207 lb (93.9 kg)  09/05/22 210 lb (95.3 kg)     Physical Exam         GEN: Well nourished, well developed in no acute distress NECK: No JVD; No carotid bruits CARDIAC: ***RRR, no murmurs, rubs, gallops RESPIRATORY:  Clear to auscultation without rales, wheezing or rhonchi  ABDOMEN: Soft, non-tender, non-distended EXTREMITIES:  No edema; No deformity   ASSESSMENT AND PLAN: .    No diagnosis found.  Assessment and Plan                 {Are you ordering a CV Procedure (e.g. stress test, cath, DCCV, TEE, etc)?   Press F2        :[id number]}   Signed, Parke Poisson, MD

## 2023-02-07 ENCOUNTER — Encounter: Payer: Self-pay | Admitting: Internal Medicine

## 2023-02-09 ENCOUNTER — Ambulatory Visit: Payer: Federal, State, Local not specified - PPO | Admitting: Internal Medicine

## 2023-03-01 ENCOUNTER — Other Ambulatory Visit (HOSPITAL_COMMUNITY): Payer: Self-pay

## 2023-03-01 DIAGNOSIS — M170 Bilateral primary osteoarthritis of knee: Secondary | ICD-10-CM | POA: Diagnosis not present

## 2023-03-01 MED ORDER — IBUPROFEN 800 MG PO TABS
800.0000 mg | ORAL_TABLET | Freq: Three times a day (TID) | ORAL | 1 refills | Status: DC | PRN
  Filled 2023-03-01: qty 90, 30d supply, fill #0

## 2023-03-13 ENCOUNTER — Other Ambulatory Visit (HOSPITAL_COMMUNITY): Payer: Self-pay

## 2023-04-11 ENCOUNTER — Other Ambulatory Visit (HOSPITAL_COMMUNITY): Payer: Self-pay

## 2023-04-11 DIAGNOSIS — N3020 Other chronic cystitis without hematuria: Secondary | ICD-10-CM | POA: Diagnosis not present

## 2023-04-11 DIAGNOSIS — R350 Frequency of micturition: Secondary | ICD-10-CM | POA: Diagnosis not present

## 2023-04-11 MED ORDER — TAMSULOSIN HCL 0.4 MG PO CAPS
0.4000 mg | ORAL_CAPSULE | Freq: Every day | ORAL | 11 refills | Status: AC
  Filled 2023-04-11: qty 30, 30d supply, fill #0
  Filled 2023-08-08: qty 30, 30d supply, fill #1
  Filled 2023-11-15: qty 30, 30d supply, fill #2
  Filled 2024-03-18: qty 30, 30d supply, fill #3

## 2023-04-22 ENCOUNTER — Other Ambulatory Visit (HOSPITAL_COMMUNITY): Payer: Self-pay

## 2023-05-01 ENCOUNTER — Other Ambulatory Visit: Payer: Self-pay

## 2023-05-01 ENCOUNTER — Other Ambulatory Visit (HOSPITAL_COMMUNITY): Payer: Self-pay

## 2023-05-01 ENCOUNTER — Telehealth: Payer: Self-pay

## 2023-05-01 DIAGNOSIS — I10 Essential (primary) hypertension: Secondary | ICD-10-CM

## 2023-05-01 MED ORDER — AMLODIPINE BESYLATE 2.5 MG PO TABS
2.5000 mg | ORAL_TABLET | Freq: Every day | ORAL | 0 refills | Status: DC
  Filled 2023-05-01: qty 90, 90d supply, fill #0

## 2023-05-01 NOTE — Telephone Encounter (Signed)
Copied from CRM [id number]. Topic: Clinical - Medication Refill >> May 01, 2023 12:48 PM Donita Brooks wrote: Most Recent Primary Care Visit:  Provider: Berniece Andreas K  Department: [facility]  Visit Type: OFFICE VISIT  Date: 10/04/2022  Medication: amLODipine (NORVASC) 2.5 MG tablet   Has the patient contacted their pharmacy? No (Agent: If no, request that the patient contact the pharmacy for the refill. If patient does not wish to contact the pharmacy document the reason why and proceed with request.) (Agent: If yes, when and what did the pharmacy advise?)  Is this the correct pharmacy for this prescription? Yes If no, delete pharmacy and type the correct one.  This is the patient's preferred pharmacy:  [facility] [address] [place] [address] Phone: [phone] Fax: [phone]   Has the prescription been filled recently? No  Is the patient out of the medication? Yes  Has the patient been seen for an appointment in the last year OR does the patient have an upcoming appointment? No  Can we respond through MyChart? Yes  Agent: Please be advised that Rx refills may take up to 3 business days. We ask that you follow-up with your pharmacy.

## 2023-07-04 DIAGNOSIS — M6281 Muscle weakness (generalized): Secondary | ICD-10-CM | POA: Diagnosis not present

## 2023-07-04 DIAGNOSIS — M6289 Other specified disorders of muscle: Secondary | ICD-10-CM | POA: Diagnosis not present

## 2023-07-04 DIAGNOSIS — M545 Low back pain, unspecified: Secondary | ICD-10-CM | POA: Diagnosis not present

## 2023-07-04 DIAGNOSIS — M62838 Other muscle spasm: Secondary | ICD-10-CM | POA: Diagnosis not present

## 2023-08-08 ENCOUNTER — Other Ambulatory Visit: Payer: Self-pay | Admitting: Family Medicine

## 2023-08-08 DIAGNOSIS — I10 Essential (primary) hypertension: Secondary | ICD-10-CM

## 2023-08-09 ENCOUNTER — Other Ambulatory Visit: Payer: Self-pay

## 2023-08-09 ENCOUNTER — Other Ambulatory Visit (HOSPITAL_COMMUNITY): Payer: Self-pay

## 2023-08-09 DIAGNOSIS — M6289 Other specified disorders of muscle: Secondary | ICD-10-CM | POA: Diagnosis not present

## 2023-08-09 DIAGNOSIS — K5900 Constipation, unspecified: Secondary | ICD-10-CM | POA: Diagnosis not present

## 2023-08-09 DIAGNOSIS — M62838 Other muscle spasm: Secondary | ICD-10-CM | POA: Diagnosis not present

## 2023-08-09 DIAGNOSIS — M6281 Muscle weakness (generalized): Secondary | ICD-10-CM | POA: Diagnosis not present

## 2023-08-09 MED ORDER — AMLODIPINE BESYLATE 2.5 MG PO TABS
2.5000 mg | ORAL_TABLET | Freq: Every day | ORAL | 0 refills | Status: DC
Start: 2023-08-09 — End: 2023-11-15
  Filled 2023-08-09: qty 90, 90d supply, fill #0

## 2023-08-10 DIAGNOSIS — R3912 Poor urinary stream: Secondary | ICD-10-CM | POA: Diagnosis not present

## 2023-08-10 DIAGNOSIS — R350 Frequency of micturition: Secondary | ICD-10-CM | POA: Diagnosis not present

## 2023-08-10 DIAGNOSIS — N3020 Other chronic cystitis without hematuria: Secondary | ICD-10-CM | POA: Diagnosis not present

## 2023-10-06 ENCOUNTER — Other Ambulatory Visit (HOSPITAL_COMMUNITY): Payer: Self-pay

## 2023-10-06 ENCOUNTER — Other Ambulatory Visit: Payer: Self-pay

## 2023-10-06 DIAGNOSIS — R102 Pelvic and perineal pain: Secondary | ICD-10-CM | POA: Diagnosis not present

## 2023-10-06 DIAGNOSIS — R8271 Bacteriuria: Secondary | ICD-10-CM | POA: Diagnosis not present

## 2023-10-06 DIAGNOSIS — N3010 Interstitial cystitis (chronic) without hematuria: Secondary | ICD-10-CM | POA: Diagnosis not present

## 2023-10-06 DIAGNOSIS — R350 Frequency of micturition: Secondary | ICD-10-CM | POA: Diagnosis not present

## 2023-10-06 MED ORDER — OXYBUTYNIN CHLORIDE ER 10 MG PO TB24
10.0000 mg | ORAL_TABLET | Freq: Every day | ORAL | 11 refills | Status: AC
  Filled 2023-10-06 (×2): qty 30, 30d supply, fill #0
  Filled 2023-11-15: qty 30, 30d supply, fill #1
  Filled 2024-06-13: qty 30, 30d supply, fill #2

## 2023-10-06 MED ORDER — URIBEL 81.6 MG PO TABS
81.6000 mg | ORAL_TABLET | Freq: Four times a day (QID) | ORAL | 5 refills | Status: AC
  Filled 2023-10-06 – 2023-10-28 (×2): qty 30, 8d supply, fill #0
  Filled 2023-11-15: qty 30, 8d supply, fill #1
  Filled 2024-03-18: qty 30, 8d supply, fill #2
  Filled 2024-06-13: qty 30, 8d supply, fill #3

## 2023-10-23 ENCOUNTER — Telehealth: Payer: Self-pay | Admitting: Acute Care

## 2023-10-23 NOTE — Telephone Encounter (Signed)
 Called and left VM for pt to call back to schedule annual LDCT.

## 2023-10-27 ENCOUNTER — Other Ambulatory Visit: Payer: Self-pay | Admitting: Acute Care

## 2023-10-27 DIAGNOSIS — F17210 Nicotine dependence, cigarettes, uncomplicated: Secondary | ICD-10-CM

## 2023-10-27 DIAGNOSIS — Z87891 Personal history of nicotine dependence: Secondary | ICD-10-CM

## 2023-10-27 DIAGNOSIS — Z122 Encounter for screening for malignant neoplasm of respiratory organs: Secondary | ICD-10-CM

## 2023-10-27 NOTE — Telephone Encounter (Signed)
 scheduled

## 2023-10-28 ENCOUNTER — Other Ambulatory Visit: Payer: Self-pay

## 2023-10-28 ENCOUNTER — Other Ambulatory Visit (HOSPITAL_COMMUNITY): Payer: Self-pay

## 2023-11-02 ENCOUNTER — Other Ambulatory Visit: Payer: Self-pay | Admitting: Family Medicine

## 2023-11-02 DIAGNOSIS — Z1231 Encounter for screening mammogram for malignant neoplasm of breast: Secondary | ICD-10-CM

## 2023-11-08 NOTE — Progress Notes (Signed)
 [facility] Quality Team Note  Name: Kayla [name] Date of Birth: 08/27/1970 MRN: [id number] Date: 11/08/2023  [facility] Quality Team has reviewed this patient's chart, please see recommendations below:  [facility] Quality Other; (CHART REVIEWED. PATIENT NEEDS CONTROLLING BLOOD PRESSURE LESS THAN 140/90, A1C LESS THAN 8.0 AND BOTH URINE MICROALBUMIN/CREATININE RATIO AND EGFR COMPLETED FOR GAP CLOSURE.)

## 2023-11-13 NOTE — Progress Notes (Incomplete)
 HPI: Kayla [name] is a 53 y.o. female with past medical history significant for DM 2, hypertension, hyperlipidemia, and vitamin D  deficiency, who is here today for chronic disease management.  Last seen on 09/05/2022, in the interim has been seen by Cardiology, Urology, OrthoSurg, and Physical Therapy  Hypertension:  Medications:*** BP readings at home:*** Side effects:***  Negative for unusual or severe headache, visual changes, exertional chest pain, dyspnea,  focal weakness, or edema.  Lab Results  Component Value Date   CREATININE 0.84 09/05/2022   BUN 18 09/05/2022   NA 138 09/05/2022   K 4.1 09/05/2022   CL 103 09/05/2022   CO2 27 09/05/2022    Hyperlipidemia: Currently on *** Following a low fat diet: ***. Side effects from medication:*** Lab Results  Component Value Date   CHOL 130 11/08/2021   HDL 44.30 11/08/2021   LDLCALC 74 11/08/2021   TRIG 59.0 11/08/2021   CHOLHDL 3 11/08/2021   Diabetes Mellitus II:  In remission s/p bariatric surgery in 2011. - Checking BG at home: *** - Medications: *** - Compliance: *** - Diet: *** - Exercise: *** - eye exam: *** - foot exam: *** - Negative for symptoms of hypoglycemia, polyuria, polydipsia, numbness extremities, foot ulcers/trauma Lab Results  Component Value Date   HGBA1C 5.8 09/05/2022   Review of Systems See other pertinent positives and negatives in HPI.  Current Outpatient Medications on File Prior to Visit  Medication Sig Dispense Refill   amLODipine  (NORVASC ) 2.5 MG tablet Take 1 tablet (2.5 mg total) by mouth daily. Please schedule physical 90 tablet 0   cetirizine  (ZYRTEC ) 10 MG tablet Take 1 tablet (10 mg total) by mouth daily. (Patient taking differently: Take 10 mg by mouth daily as needed for allergies.) 30 tablet 11   ciprofloxacin  (CIPRO ) 250 MG tablet Take 1 tablet (250 mg total) by mouth 2 (two) times daily. 6 tablet 0   ibuprofen  (ADVIL ) 800 MG tablet Take 1 tablet by mouth  three times a day as needed 90 tablet 1   lubiprostone  (AMITIZA ) 8 MCG capsule Take 1 capsule by mouth 2 times daily with a meal. 60 capsule 3   Meth-Hyo-M Bl-Benz Acd-Ph Sal (URIBEL ) 81.6 MG TABS Take 1 tablet by mouth 4 (four) times daily. 30 tablet 5   omeprazole  (PRILOSEC) 40 MG capsule TAKE 1 CAPSULE BY MOUTH DAILY. (Patient not taking: Reported on 11/04/2022) 30 capsule 3   oxybutynin  (DITROPAN -XL) 10 MG 24 hr tablet Take 1 tablet (10 mg total) by mouth daily. 30 tablet 11   oxybutynin  (DITROPAN -XL) 10 MG 24 hr tablet Take 1 tablet (10 mg total) by mouth daily. 30 tablet 11   oxyCODONE -acetaminophen  (PERCOCET) 5-325 MG tablet Take 1-2 tablets by mouth every 6 (six) hours as needed. 10 tablet 0   pentosan polysulfate (ELMIRON ) 100 MG capsule Take 2 capsules (200 mg total) by mouth 2 (two) times daily. 120 capsule 11   phenazopyridine  (PYRIDIUM ) 100 MG tablet Take 1 tablet (100 mg total) by mouth 2 (two) times daily. 20 tablet 3   pravastatin  (PRAVACHOL ) 10 MG tablet Take 1 tablet (10 mg total) by mouth daily. (Patient not taking: Reported on 11/04/2022) 90 tablet 3   tamsulosin  (FLOMAX ) 0.4 MG CAPS capsule Take 1 capsule (0.4 mg total) by mouth daily. 30 capsule 11   No current facility-administered medications on file prior to visit.    Past Medical History:  Diagnosis Date   Allergy    Anemia  Arthritis    lt knee   Back pain    Breast cyst, right    Constipation    Dyspnea    Fatigue    GERD (gastroesophageal reflux disease)    Hyperlipidemia    Hypertension    Joint pain    Leg edema    Obesity    Sleep apnea    lost weight 2013 gastric sleeve, no longer a problem   Sleep disturbance    SVD (spontaneous vaginal delivery)    x 3   Tobacco use    No Known Allergies  Social History   Socioeconomic History   Marital status: Married    Spouse name: Jerel Joshua Raddle   Number of children: 4   Years of education: Not on file   Highest education level: Not on file   Occupational History   Not on file  Tobacco Use   Smoking status: Former    Current packs/day: 0.00    Average packs/day: 1 pack/day for 25.0 years (25.0 ttl pk-yrs)    Types: Cigarettes    Start date: 08/28/1995    Quit date: 08/27/2020    Years since quitting: 3.2   Smokeless tobacco: Current  Vaping Use   Vaping status: Never Used  Substance and Sexual Activity   Alcohol use: Yes    Alcohol/week: 0.0 standard drinks of alcohol    Comment: socially   Drug use: No   Sexual activity: Yes    Partners: Male    Birth control/protection: Surgical  Other Topics Concern   Not on file  Social History Narrative   Work or School: Corporate treasurer, Geneticist, molecular      Home Situation: lives with husband and 20 yo sone in 2016      Spiritual Beliefs: Christian      Lifestyle: no regular exercise, poor diet      Social Drivers of Corporate investment banker Strain: Not on file  Food Insecurity: Not on file  Transportation Needs: Not on file  Physical Activity: Not on file  Stress: Not on file  Social Connections: Not on file    There were no vitals filed for this visit. There is no height or weight on file to calculate BMI.  Physical Exam  ASSESSMENT AND PLAN: Kayla [name] was seen here today for chronic disease management.  No orders of the defined types were placed in this encounter.   No problem-specific Assessment & Plan notes found for this encounter.   No follow-ups on file. I,Emily Lagle,acting as a Neurosurgeon for Betty [place], MD.,have documented all relevant documentation on the behalf of Betty [place], MD,as directed by  Betty [place], MD while in the presence of Betty [place], MD.  *** (refresh reminder)  I, Betty [place], MD, have reviewed all documentation for this visit. The documentation on 11/13/23 for the exam, diagnosis, procedures, and orders are all accurate and complete. Betty G. [place], MD  [facility]. [facility] office.

## 2023-11-15 ENCOUNTER — Other Ambulatory Visit (HOSPITAL_COMMUNITY): Payer: Self-pay

## 2023-11-15 ENCOUNTER — Ambulatory Visit: Admitting: Family Medicine

## 2023-11-15 ENCOUNTER — Encounter: Payer: Self-pay | Admitting: Family Medicine

## 2023-11-15 ENCOUNTER — Other Ambulatory Visit: Payer: Self-pay | Admitting: Family Medicine

## 2023-11-15 ENCOUNTER — Ambulatory Visit
Admission: RE | Admit: 2023-11-15 | Discharge: 2023-11-15 | Disposition: A | Discharge status: Home / Self Care | Source: Ambulatory Visit | Attending: Acute Care | Admitting: Acute Care

## 2023-11-15 ENCOUNTER — Ambulatory Visit
Admission: RE | Admit: 2023-11-15 | Discharge: 2023-11-15 | Disposition: A | Discharge status: Home / Self Care | Source: Ambulatory Visit | Attending: Family Medicine | Admitting: Family Medicine

## 2023-11-15 VITALS — BP 128/70 | HR 91 | Temp 98.5°F | Resp 16 | Ht 66.0 in | Wt 226.0 lb

## 2023-11-15 DIAGNOSIS — Z122 Encounter for screening for malignant neoplasm of respiratory organs: Secondary | ICD-10-CM | POA: Diagnosis not present

## 2023-11-15 DIAGNOSIS — Z1231 Encounter for screening mammogram for malignant neoplasm of breast: Secondary | ICD-10-CM | POA: Diagnosis not present

## 2023-11-15 DIAGNOSIS — Z23 Encounter for immunization: Secondary | ICD-10-CM

## 2023-11-15 DIAGNOSIS — E785 Hyperlipidemia, unspecified: Secondary | ICD-10-CM | POA: Diagnosis not present

## 2023-11-15 DIAGNOSIS — Z0000 Encounter for general adult medical examination without abnormal findings: Secondary | ICD-10-CM | POA: Diagnosis not present

## 2023-11-15 DIAGNOSIS — I700 Atherosclerosis of aorta: Secondary | ICD-10-CM | POA: Diagnosis not present

## 2023-11-15 DIAGNOSIS — Z87891 Personal history of nicotine dependence: Secondary | ICD-10-CM | POA: Diagnosis not present

## 2023-11-15 DIAGNOSIS — I10 Essential (primary) hypertension: Secondary | ICD-10-CM

## 2023-11-15 DIAGNOSIS — E119 Type 2 diabetes mellitus without complications: Secondary | ICD-10-CM | POA: Diagnosis not present

## 2023-11-15 DIAGNOSIS — E559 Vitamin D deficiency, unspecified: Secondary | ICD-10-CM | POA: Diagnosis not present

## 2023-11-15 DIAGNOSIS — F17210 Nicotine dependence, cigarettes, uncomplicated: Secondary | ICD-10-CM

## 2023-11-15 LAB — VITAMIN D 25 HYDROXY (VIT D DEFICIENCY, FRACTURES): VITD: 20.95 ng/mL — ABNORMAL LOW (ref 30.00–100.00)

## 2023-11-15 LAB — COMPREHENSIVE METABOLIC PANEL WITH GFR
ALT: 18 U/L (ref 0–35)
AST: 14 U/L (ref 0–37)
Albumin: 4.2 g/dL (ref 3.5–5.2)
Alkaline Phosphatase: 49 U/L (ref 39–117)
BUN: 20 mg/dL (ref 6–23)
CO2: 31 meq/L (ref 19–32)
Calcium: 9.3 mg/dL (ref 8.4–10.5)
Chloride: 103 meq/L (ref 96–112)
Creatinine, Ser: 0.76 mg/dL (ref 0.40–1.20)
GFR: 88.2 mL/min (ref >60.00)
Glucose, Bld: 82 mg/dL (ref 70–99)
Potassium: 4.1 meq/L (ref 3.5–5.1)
Sodium: 138 meq/L (ref 135–145)
Total Bilirubin: 0.6 mg/dL (ref 0.2–1.2)
Total Protein: 7.4 g/dL (ref 6.0–8.3)

## 2023-11-15 LAB — LIPID PANEL
Cholesterol: 166 mg/dL (ref 0–200)
HDL: 67.2 mg/dL (ref >39.00)
LDL Cholesterol: 87 mg/dL (ref 0–99)
NonHDL: 98.66
Total CHOL/HDL Ratio: 2
Triglycerides: 56 mg/dL (ref 0.0–149.0)
VLDL: 11.2 mg/dL (ref 0.0–40.0)

## 2023-11-15 LAB — MICROALBUMIN / CREATININE URINE RATIO
Creatinine,U: 137.1 mg/dL
Microalb Creat Ratio: UNDETERMINED mg/g (ref 0.0–30.0)
Microalb, Ur: <0.7 mg/dL

## 2023-11-15 LAB — HEMOGLOBIN A1C: Hgb A1c MFr Bld: 6 % (ref 4.6–6.5)

## 2023-11-15 MED ORDER — AMLODIPINE BESYLATE 2.5 MG PO TABS
2.5000 mg | ORAL_TABLET | Freq: Every day | ORAL | 3 refills | Status: AC
  Filled 2023-11-15: qty 90, 90d supply, fill #0
  Filled 2024-03-18: qty 90, 90d supply, fill #1
  Filled 2024-06-13: qty 90, 90d supply, fill #2

## 2023-11-15 NOTE — Assessment & Plan Note (Signed)
 HgA1C has been at goal with non pharmacologic treatment since bariatric procedure in 2011. Annual eye exam, periodic dental and foot care recommended. F/U in 5-6 months.

## 2023-11-15 NOTE — Assessment & Plan Note (Signed)
She is not on vitamin D supplementation. Further recommendation will be given according to 25 [place] vitamin D result. 

## 2023-11-15 NOTE — Progress Notes (Signed)
 HPI: Kayla [name] is a 53 y.o. female with a PMHx significant for DM 2, hypertension, hyperlipidemia, and vitamin D  deficiency, who is here today for her routine physical.  Last CPE: on file in [facility] 09/25/2017, last seen in-office 09/05/2022 Other Providers Urology - Dr. Glendia Elizabeth, last seen 10/06/2023 Orthopedist - Dr. Fonda Olmsted, last seen 03/01/2023 Pulmonologist - being seen today  Exercise: no Diet: doesn't eat much during the day, eats right before bedtime and occasionally snacks in the middle of the night Sleep: average of 4 hours, struggling with waking up in the middle of the night. Alcohol Use: quit  Smoking: hx of, quit in 2022, annual lung cancer screenings w/ 10/11/2022 impression: Lung-RADS 1, negative. Aortic Atherosclerosis. Follows-up today.   Vision: not recently Dental: not recently  Chronic medical problems:   Hypertension:  Medications: Amlodipine  2.5 mg daily BP readings at home: no Side effects: none reported Negative for unusual or severe headache, visual changes, exertional chest pain, dyspnea,  focal weakness, or edema. BP Readings from Last 3 Encounters:  11/15/23 128/70  11/04/22 129/88  10/04/22 90/70   Lab Results  Component Value Date   NA 138 09/05/2022   CL 103 09/05/2022   K 4.1 09/05/2022   CO2 27 09/05/2022   BUN 18 09/05/2022   CREATININE 0.84 09/05/2022   GFR 78.88 09/05/2022   CALCIUM 9.3 09/05/2022   ALBUMIN 4.1 11/08/2021   GLUCOSE 77 09/05/2022   Hyperlipidemia: Currently not on pharmacological treatment. Lab Results  Component Value Date   CHOL 130 11/08/2021   HDL 44.30 11/08/2021   LDLCALC 74 11/08/2021   TRIG 59.0 11/08/2021   CHOLHDL 3 11/08/2021   Diabetes Mellitus II:  In remission s/p bariatric surgery in 2011. - Checking BG at home: no - Medications: none - eye exam: not recently - foot exam: done today - Negative for symptoms of hypoglycemia, polyuria, polydipsia, numbness extremities, foot  ulcers/trauma Lab Results  Component Value Date   HGBA1C 5.8 09/05/2022   Interstitial Cystitis: Managed with Flomax  daily, Oxybutynin  10 mg daily, Uribel  81.6 mg 4x daily, and Elmiron  200 mg twice daily.  Followed by [facility], last seen there 10/06/2023.  S/p Hysterectomy due to fibroids.  Concerns today:   Weight Gain: She c/o of some weight gain over the past year. Gained 19 pounds since last being seen in-office.  Wt Readings from Last 3 Encounters:  11/15/23 226 lb (102.5 kg)  11/04/22 207 lb 12.8 oz (94.3 kg)  10/04/22 207 lb (93.9 kg)   BMI Readings from Last 3 Encounters:  11/15/23 36.48 kg/m  11/04/22 33.54 kg/m  10/04/22 33.41 kg/m   Due for Tdap, PNA, Shingrix - she is agreeable to receiving Tdap and PNA today.   Immunization History  Administered Date(s) Administered   Influenza-Unspecified 01/08/2015, 01/07/2017, 12/07/2017   PFIZER(Purple Top)SARS-COV-2 Vaccination 06/28/2019, 07/18/2019, 05/16/2020   PNEUMOCOCCAL CONJUGATE-20 11/15/2023   Tdap 11/15/2023   Health Maintenance  Topic Date Due   OPHTHALMOLOGY EXAM  Never done   Diabetic kidney evaluation - Urine ACR  Never done   Hepatitis B Vaccines (1 of 3 - 19+ 3-dose series) Never done   Zoster Vaccines- Shingrix (1 of 2) Never done   HEMOGLOBIN A1C  03/07/2023   Diabetic kidney evaluation - eGFR measurement  09/05/2023   Lung Cancer Screening  10/11/2023   MAMMOGRAM  10/11/2023   COVID-19 Vaccine (4 - 2024-25 season) 12/01/2023 (Originally 01/08/2023)   INFLUENZA VACCINE  12/08/2023   FOOT EXAM  11/14/2024   Colonoscopy  11/12/2025   DTaP/Tdap/Td (2 - Td or Tdap) 11/14/2033   Pneumococcal Vaccine 75-42 Years old  Completed   Hepatitis C Screening  Completed   HIV Screening  Completed   HPV VACCINES  Aged Out   Meningococcal B Vaccine  Aged Out      Review of Systems  Constitutional:  Negative for appetite change, fatigue and fever.  HENT:  Negative for dental problem, hearing loss,  mouth sores, sore throat, trouble swallowing and voice change.   Eyes:  Negative for redness and visual disturbance.  Respiratory:  Negative for cough, shortness of breath and wheezing.   Cardiovascular:  Negative for chest pain and leg swelling.  Gastrointestinal:  Negative for abdominal pain, nausea and vomiting.       No changes in bowel habits.  Endocrine: Negative for cold intolerance, heat intolerance, polydipsia, polyphagia and polyuria.  Genitourinary:  Negative for decreased urine volume, dysuria, hematuria, vaginal bleeding and vaginal discharge.  Musculoskeletal:  Negative for arthralgias, gait problem and myalgias.  Skin:  Negative for color change and rash.  Allergic/Immunologic: Negative for environmental allergies.  Neurological:  Negative for syncope, weakness and headaches.  Hematological:  Negative for adenopathy. Does not bruise/bleed easily.  Psychiatric/Behavioral:  Negative for confusion and sleep disturbance. The patient is not nervous/anxious.   All other systems reviewed and are negative.   Current Outpatient Medications on File Prior to Visit  Medication Sig Dispense Refill   cetirizine  (ZYRTEC ) 10 MG tablet Take 1 tablet (10 mg total) by mouth daily. 30 tablet 11   Meth-Hyo-M Bl-Benz Acd-Ph Sal (URIBEL ) 81.6 MG TABS Take 1 tablet by mouth 4 (four) times daily. 30 tablet 5   oxybutynin  (DITROPAN -XL) 10 MG 24 hr tablet Take 1 tablet (10 mg total) by mouth daily. 30 tablet 11   pentosan polysulfate (ELMIRON ) 100 MG capsule Take 2 capsules (200 mg total) by mouth 2 (two) times daily. 120 capsule 11   tamsulosin  (FLOMAX ) 0.4 MG CAPS capsule Take 1 capsule (0.4 mg total) by mouth daily. 30 capsule 11   No current facility-administered medications on file prior to visit.    Past Medical History:  Diagnosis Date   Allergy    Anemia    Arthritis    lt knee   Back pain    Breast cyst, right    Constipation    Dyspnea    Fatigue    GERD (gastroesophageal reflux  disease)    Hyperlipidemia    Hypertension    Joint pain    Leg edema    Obesity    Sleep apnea    lost weight 2013 gastric sleeve, no longer a problem   Sleep disturbance    SVD (spontaneous vaginal delivery)    x 3   Tobacco use     Past Surgical History:  Procedure Laterality Date   ABDOMINAL HYSTERECTOMY     BILATERAL SALPINGECTOMY Bilateral 09/27/2013   Procedure: BILATERAL SALPINGECTOMY;  Surgeon: Olam Mill, MD;  Location: [facility];  Service: Gynecology;  Laterality: Bilateral;   BREAST BIOPSY Right 06/18/2020   BREAST CYST EXCISION Right 06/18/2020   Procedure: EXCISION RIGHT BREAST CHRONICALLY INFECTED SEBACEOUS CYST;  Surgeon: Vernetta Berg, MD;  Location: [facility];  Service: General;  Laterality: Right;   CESAREAN SECTION     x 1   COLONOSCOPY  11/12/2020   2016   CYSTO WITH HYDRODISTENSION N/A 09/30/2019   Procedure: CYSTOSCOPY/HYDRODISTENSION;  Surgeon: Gaston [place], MD;  Location:  [facility];  Service: Urology;  Laterality: N/A;  fulgeration   CYSTOSCOPY WITH BIOPSY N/A 09/30/2019   Procedure: CYSTOSCOPY WITH BLADDER BIOPSY;  Surgeon: Gaston [place], MD;  Location: [facility];  Service: Urology;  Laterality: N/A;   HAND FUSION     LAPAROTOMY Left 09/27/2013   Procedure: EXPLORATORY LAPAROTOMY W/ EXPLORATION OF LEFT ILLIAC ARTERY & VEIN;  Surgeon: Olam Mill, MD;  Location: [facility];  Service: Gynecology;  Laterality: Left;   ROBOTIC ASSISTED TOTAL HYSTERECTOMY N/A 09/27/2013   Procedure: ROBOTIC ASSISTED TOTAL HYSTERECTOMY;  Surgeon: Olam Mill, MD;  Location: [facility];  Service: Gynecology;  Laterality: N/A;   SLEEVE GASTROPLASTY  2013   in [place]    No Known Allergies  Family History  Problem Relation Age of Onset   Hypertension Mother    Colon polyps Mother    Hyperlipidemia Mother    Colon cancer Maternal Grandfather 21   Colon cancer Maternal Aunt        onset in 58s   Stomach cancer Maternal Aunt    Leukemia  Paternal Grandmother    Colon polyps Maternal Uncle    Colon polyps Maternal Aunt     Social History   Socioeconomic History   Marital status: Married    Spouse name: Jerel Joshua Raddle   Number of children: 4   Years of education: Not on file   Highest education level: Not on file  Occupational History   Not on file  Tobacco Use   Smoking status: Former    Current packs/day: 0.00    Average packs/day: 1 pack/day for 25.0 years (25.0 ttl pk-yrs)    Types: Cigarettes    Start date: 08/28/1995    Quit date: 08/27/2020    Years since quitting: 3.2   Smokeless tobacco: Current  Vaping Use   Vaping status: Never Used  Substance and Sexual Activity   Alcohol use: Yes    Alcohol/week: 0.0 standard drinks of alcohol    Comment: socially   Drug use: No   Sexual activity: Yes    Partners: Male    Birth control/protection: Surgical  Other Topics Concern   Not on file  Social History Narrative   Work or School: Corporate treasurer, Geneticist, molecular      Home Situation: lives with husband and 17 yo sone in 2016      Spiritual Beliefs: Christian      Lifestyle: no regular exercise, poor diet      Social Drivers of Corporate investment banker Strain: Not on file  Food Insecurity: Not on file  Transportation Needs: Not on file  Physical Activity: Not on file  Stress: Not on file  Social Connections: Not on file    Vitals:   11/15/23 0912  BP: 128/70  Pulse: 91  Resp: 16  Temp: 98.5 F (36.9 C)  SpO2: 97%   Body mass index is 36.48 kg/m.  Wt Readings from Last 3 Encounters:  11/15/23 226 lb (102.5 kg)  11/04/22 207 lb 12.8 oz (94.3 kg)  10/04/22 207 lb (93.9 kg)    Physical Exam Vitals and nursing note reviewed.  Constitutional:      General: She is not in acute distress.    Appearance: She is well-developed.  HENT:     Head: Normocephalic and atraumatic.     Right Ear: Hearing, tympanic membrane, ear canal and external ear normal.     Left Ear: Hearing, tympanic  membrane, ear canal and external ear normal.  Mouth/Throat:     Mouth: Mucous membranes are moist.     Pharynx: Oropharynx is clear. Uvula midline.  Eyes:     Extraocular Movements: Extraocular movements intact.     Conjunctiva/sclera: Conjunctivae normal.     Pupils: Pupils are equal, round, and reactive to light.  Neck:     Thyroid : No thyromegaly.     Trachea: No tracheal deviation.  Cardiovascular:     Rate and Rhythm: Normal rate and regular rhythm.     Pulses:          Dorsalis pedis pulses are 2+ on the right side and 2+ on the left side.       Posterior tibial pulses are 2+ on the right side and 2+ on the left side.     Heart sounds: No murmur heard. Pulmonary:     Effort: Pulmonary effort is normal. No respiratory distress.     Breath sounds: Normal breath sounds.  Abdominal:     Palpations: Abdomen is soft. There is no hepatomegaly or mass.     Tenderness: There is no abdominal tenderness.  Genitourinary:    Comments: Deferred to gyn. Musculoskeletal:     Comments: No major deformity or signs of synovitis appreciated.  Lymphadenopathy:     Cervical: No cervical adenopathy.     Upper Body:     Right upper body: No supraclavicular adenopathy.     Left upper body: No supraclavicular adenopathy.  Skin:    General: Skin is warm.     Findings: No erythema or rash.  Neurological:     General: No focal deficit present.     Mental Status: She is alert and oriented to person, place, and time.     Cranial Nerves: No cranial nerve deficit.     Coordination: Coordination normal.     Gait: Gait normal.     Deep Tendon Reflexes:     Reflex Scores:      Bicep reflexes are 2+ on the right side and 2+ on the left side.      Patellar reflexes are 2+ on the right side and 2+ on the left side. Psychiatric:     Comments: Well groomed, good eye contact.     ASSESSMENT AND PLAN: Kayla [name] was seen here today for her annual physical examination.  Orders Placed This  Encounter  Procedures   Pneumococcal conjugate vaccine 20-valent   Tdap vaccine greater than or equal to 7yo IM   Comprehensive metabolic panel with GFR   Microalbumin / creatinine urine ratio   VITAMIN D  25 Hydroxy (Vit-D Deficiency, Fractures)   Lipid panel   Hemoglobin A1c   Routine general medical examination at a health care facility Assessment & Plan: We discussed the importance of regular physical activity and healthy diet for prevention of chronic illness and/or complications. Preventive guidelines reviewed. Vaccination: Prevnar 20 and Tdap given today, prefers to hold on Shingrix. Status post hysterectomy. Scheduled for mammogram and lung cancer screening today. Next CPE in a year.   Essential hypertension Assessment & Plan: BP adequately controlled. Continue amlodipine  2.5 mg daily. Recommend monitoring BP at home. Eye exam is due.   Type 2 diabetes mellitus in remission [facility]) Assessment & Plan: HgA1C has been at goal with non pharmacologic treatment since bariatric procedure in 2011. Annual eye exam, periodic dental and foot care recommended. F/U in 5-6 months.  Orders: -     Microalbumin / creatinine urine ratio; Future -     Hemoglobin  A1c; Future  Dyslipidemia (high LDL; low HDL) Assessment & Plan: She is not on pharmacologic treatment, she was on pravastatin  in the past, discontinued, no side effects reported. Further recommendation according to FLP result.  Orders: -     Comprehensive metabolic panel with GFR; Future -     Lipid panel; Future  Vitamin D  deficiency, unspecified -     VITAMIN D  25 Hydroxy (Vit-D Deficiency, Fractures); Future  Immunization due -     Pneumococcal conjugate vaccine 20-valent -     Tdap vaccine greater than or equal to 7yo IM    Return in 6 months (on 05/17/2024) for chronic problems. I,Emily Lagle,acting as a Neurosurgeon for Kayla Kristiansen [place], MD.,have documented all relevant documentation on the behalf of Kayla Mazur [place], MD,as  directed by  Kayla Fendrick [place], MD while in the presence of Kayla Leary [place], MD.  *** (refresh reminder)  I, Kayla Weakland [place], MD, have reviewed all documentation for this visit. The documentation on 11/15/23 for the exam, diagnosis, procedures, and orders are all accurate and complete. Kayla Arkin G. [place], MD  [facility]. [place] office.

## 2023-11-15 NOTE — Patient Instructions (Addendum)
 A few things to remember from today's visit:  Routine general medical examination at a health care facility  Essential hypertension  Type 2 diabetes mellitus in remission (HCC) - Plan: Microalbumin / creatinine urine ratio, Hemoglobin A1c  Dyslipidemia (high LDL; low HDL) - Plan: Comprehensive metabolic panel with GFR, Lipid panel  Vitamin D  deficiency, unspecified - Plan: VITAMIN D  25 Hydroxy (Vit-D Deficiency, Fractures)  Arrange apt with urologist and eye care provider. Avoid eating 3-4 hours before bedtime. Try to eat lunch. No changes today.  If you need refills for medications you take chronically, please call your pharmacy. Do not use My Chart to request refills or for acute issues that need immediate attention. If you send a my chart message, it may take a few days to be addressed, specially if I am not in the office.  Please be sure medication list is accurate. If a new problem present, please set up appointment sooner than planned today.  Health Maintenance, Female Adopting a healthy lifestyle and getting preventive care are important in promoting health and wellness. Ask your health care provider about: The right schedule for you to have regular tests and exams. Things you can do on your own to prevent diseases and keep yourself healthy. What should I know about diet, weight, and exercise? Eat a healthy diet  Eat a diet that includes plenty of vegetables, fruits, low-fat dairy products, and lean protein. Do not eat a lot of foods that are high in solid fats, added sugars, or sodium. Maintain a healthy weight Body mass index (BMI) is used to identify weight problems. It estimates body fat based on height and weight. Your health care provider can help determine your BMI and help you achieve or maintain a healthy weight. Get regular exercise Get regular exercise. This is one of the most important things you can do for your health. Most adults should: Exercise for at least  150 minutes each week. The exercise should increase your heart rate and make you sweat (moderate-intensity exercise). Do strengthening exercises at least twice a week. This is in addition to the moderate-intensity exercise. Spend less time sitting. Even light physical activity can be beneficial. Watch cholesterol and blood lipids Have your blood tested for lipids and cholesterol at 55 years of age, then have this test every 5 years. Have your cholesterol levels checked more often if: Your lipid or cholesterol levels are high. You are older than 55 years of age. You are at high risk for heart disease. What should I know about cancer screening? Depending on your health history and family history, you may need to have cancer screening at various ages. This may include screening for: Breast cancer. Cervical cancer. Colorectal cancer. Skin cancer. Lung cancer. What should I know about heart disease, diabetes, and high blood pressure? Blood pressure and heart disease High blood pressure causes heart disease and increases the risk of stroke. This is more likely to develop in people who have high blood pressure readings or are overweight. Have your blood pressure checked: Every 3-5 years if you are 10-54 years of age. Every year if you are 42 years old or older. Diabetes Have regular diabetes screenings. This checks your fasting blood sugar level. Have the screening done: Once every three years after age 31 if you are at a normal weight and have a low risk for diabetes. More often and at a younger age if you are overweight or have a high risk for diabetes. What should I know about  preventing infection? Hepatitis B If you have a higher risk for hepatitis B, you should be screened for this virus. Talk with your health care provider to find out if you are at risk for hepatitis B infection. Hepatitis C Testing is recommended for: Everyone born from [birth year] through 1967. Anyone with known risk  factors for hepatitis C. Sexually transmitted infections (STIs) Get screened for STIs, including gonorrhea and chlamydia, if: You are sexually active and are younger than 54 years of age. You are older than 55 years of age and your health care provider tells you that you are at risk for this type of infection. Your sexual activity has changed since you were last screened, and you are at increased risk for chlamydia or gonorrhea. Ask your health care provider if you are at risk. Ask your health care provider about whether you are at high risk for HIV. Your health care provider may recommend a prescription medicine to help prevent HIV infection. If you choose to take medicine to prevent HIV, you should first get tested for HIV. You should then be tested every 3 months for as long as you are taking the medicine. Pregnancy If you are about to stop having your period (premenopausal) and you may become pregnant, seek counseling before you get pregnant. Take 400 to 800 micrograms (mcg) of folic acid  every day if you become pregnant. Ask for birth control (contraception) if you want to prevent pregnancy. Osteoporosis and menopause Osteoporosis is a disease in which the bones lose minerals and strength with aging. This can result in bone fractures. If you are 69 years old or older, or if you are at risk for osteoporosis and fractures, ask your health care provider if you should: Be screened for bone loss. Take a calcium or vitamin D  supplement to lower your risk of fractures. Be given hormone replacement therapy (HRT) to treat symptoms of menopause. Follow these instructions at home: Alcohol use Do not drink alcohol if: Your health care provider tells you not to drink. You are pregnant, may be pregnant, or are planning to become pregnant. If you drink alcohol: Limit how much you have to: 0-1 drink a day. Know how much alcohol is in your drink. In the [place] one drink equals one 12 oz bottle of beer  (355 mL), one 5 oz glass of wine (148 mL), or one 1 oz glass of hard liquor (44 mL). Lifestyle Do not use any products that contain nicotine  or tobacco. These products include cigarettes, chewing tobacco, and vaping devices, such as e-cigarettes. If you need help quitting, ask your health care provider. Do not use street drugs. Do not share needles. Ask your health care provider for help if you need support or information about quitting drugs. General instructions Schedule regular health, dental, and eye exams. Stay current with your vaccines. Tell your health care provider if: You often feel depressed. You have ever been abused or do not feel safe at home. Summary Adopting a healthy lifestyle and getting preventive care are important in promoting health and wellness. Follow your health care provider's instructions about healthy diet, exercising, and getting tested or screened for diseases. Follow your health care provider's instructions on monitoring your cholesterol and blood pressure. This information is not intended to replace advice given to you by your health care provider. Make sure you discuss any questions you have with your health care provider. Document Revised: 09/14/2020 Document Reviewed: 09/14/2020 Elsevier Patient Education  2024 [employer].

## 2023-11-15 NOTE — Assessment & Plan Note (Signed)
 We discussed the importance of regular physical activity and healthy diet for prevention of chronic illness and/or complications. Preventive guidelines reviewed. Vaccination: Prevnar 20 and Tdap given today, prefers to hold on Shingrix. Status post hysterectomy. Scheduled for mammogram and lung cancer screening today. Next CPE in a year.

## 2023-11-15 NOTE — Assessment & Plan Note (Signed)
 BP adequately controlled. Continue amlodipine  2.5 mg daily. Recommend monitoring BP at home. Eye exam is due.

## 2023-11-15 NOTE — Assessment & Plan Note (Signed)
 She is not on pharmacologic treatment, she was on pravastatin  in the past, discontinued, no side effects reported. Further recommendation according to FLP result.

## 2023-11-16 ENCOUNTER — Ambulatory Visit: Payer: Self-pay | Admitting: Family Medicine

## 2023-11-16 ENCOUNTER — Other Ambulatory Visit (HOSPITAL_COMMUNITY): Payer: Self-pay

## 2023-11-17 ENCOUNTER — Other Ambulatory Visit (HOSPITAL_COMMUNITY): Payer: Self-pay

## 2023-11-17 ENCOUNTER — Other Ambulatory Visit: Payer: Self-pay

## 2023-11-17 MED ORDER — PRAVASTATIN SODIUM 20 MG PO TABS
20.0000 mg | ORAL_TABLET | Freq: Every day | ORAL | 3 refills | Status: AC
  Filled 2023-11-17: qty 90, 90d supply, fill #0
  Filled 2024-03-18: qty 90, 90d supply, fill #1
  Filled 2024-06-13: qty 90, 90d supply, fill #2

## 2023-11-18 ENCOUNTER — Other Ambulatory Visit (HOSPITAL_COMMUNITY): Payer: Self-pay

## 2023-11-20 ENCOUNTER — Other Ambulatory Visit: Payer: Self-pay

## 2023-11-21 ENCOUNTER — Other Ambulatory Visit (HOSPITAL_COMMUNITY): Payer: Self-pay

## 2023-11-27 ENCOUNTER — Other Ambulatory Visit: Payer: Self-pay

## 2023-11-27 DIAGNOSIS — Z122 Encounter for screening for malignant neoplasm of respiratory organs: Secondary | ICD-10-CM

## 2023-11-27 DIAGNOSIS — Z87891 Personal history of nicotine dependence: Secondary | ICD-10-CM

## 2023-11-28 DIAGNOSIS — N3020 Other chronic cystitis without hematuria: Secondary | ICD-10-CM | POA: Diagnosis not present

## 2023-11-28 DIAGNOSIS — N3010 Interstitial cystitis (chronic) without hematuria: Secondary | ICD-10-CM | POA: Diagnosis not present

## 2023-11-28 DIAGNOSIS — R102 Pelvic and perineal pain: Secondary | ICD-10-CM | POA: Diagnosis not present

## 2023-12-19 ENCOUNTER — Other Ambulatory Visit (HOSPITAL_COMMUNITY): Payer: Self-pay

## 2023-12-19 DIAGNOSIS — R350 Frequency of micturition: Secondary | ICD-10-CM | POA: Diagnosis not present

## 2023-12-19 DIAGNOSIS — N3020 Other chronic cystitis without hematuria: Secondary | ICD-10-CM | POA: Diagnosis not present

## 2023-12-19 MED ORDER — OXYBUTYNIN CHLORIDE ER 10 MG PO TB24
10.0000 mg | ORAL_TABLET | Freq: Every day | ORAL | 3 refills | Status: AC
  Filled 2023-12-19: qty 90, 90d supply, fill #0
  Filled 2024-03-18: qty 90, 90d supply, fill #1
  Filled 2024-06-13: qty 90, 90d supply, fill #2

## 2023-12-19 MED ORDER — TAMSULOSIN HCL 0.4 MG PO CAPS
0.4000 mg | ORAL_CAPSULE | Freq: Every day | ORAL | 3 refills | Status: AC
  Filled 2023-12-19: qty 90, 90d supply, fill #0
  Filled 2024-06-13: qty 90, 90d supply, fill #1

## 2023-12-22 DIAGNOSIS — N3020 Other chronic cystitis without hematuria: Secondary | ICD-10-CM | POA: Diagnosis not present

## 2023-12-25 DIAGNOSIS — N3020 Other chronic cystitis without hematuria: Secondary | ICD-10-CM | POA: Diagnosis not present

## 2023-12-27 ENCOUNTER — Other Ambulatory Visit: Payer: Self-pay | Admitting: Urology

## 2023-12-27 DIAGNOSIS — N3020 Other chronic cystitis without hematuria: Secondary | ICD-10-CM | POA: Diagnosis not present

## 2023-12-29 DIAGNOSIS — N3020 Other chronic cystitis without hematuria: Secondary | ICD-10-CM | POA: Diagnosis not present

## 2024-01-01 NOTE — Progress Notes (Signed)
 COVID Vaccine Completed: yes  Date of COVID positive in last 90 days:  PCP - Betty [place], MD Cardiologist - Dr. Loni saw 11/04/22 for palpitations, did not follow up  Chest x-ray - CT 11/15/23 Epic EKG - N/A Stress Test - N/A ECHO - 11/30/22 Epic Cardiac Cath -  Pacemaker/ICD device last checked:N/A Spinal Cord Stimulator:N/A  Bowel Prep - N/A  Sleep Study - N/A CPAP -   Fasting Blood Sugar -  Checks Blood Sugar _____ times a day  Last dose of GLP1 agonist-  N/A GLP1 instructions:  Do not take after     Last dose of SGLT-2 inhibitors-  N/A SGLT-2 instructions:  Do not take after     Blood Thinner Instructions: N/A Last dose:   Time: Aspirin Instructions:N/A Last Dose:  Activity level:  Can go up a flight of stairs and perform activities of daily living without stopping and without symptoms of chest pain or shortness of breath.  Able to exercise without symptoms  Unable to go up a flight of stairs without symptoms of     Anesthesia review: palpitations- was recommended heart monitor, did not see follow-up, HTN, OSA, DM2, anemia  Patient denies shortness of breath, fever, cough and chest pain at PAT appointment  Patient verbalized understanding of instructions that were given to them at the PAT appointment. Patient was also instructed that they will need to review over the PAT instructions again at home before surgery.

## 2024-01-02 NOTE — Patient Instructions (Signed)
 SURGICAL WAITING ROOM VISITATION  Patients having surgery or a procedure may have no more than 2 support people in the waiting area - these visitors may rotate.    Children under the age of 86 must have an adult with them who is not the patient.  Visitors with respiratory illnesses are discouraged from visiting and should remain at home.  If the patient needs to stay at the hospital during part of their recovery, the visitor guidelines for inpatient rooms apply. Pre-op nurse will coordinate an appropriate time for 1 support person to accompany patient in pre-op.  This support person may not rotate.    Please refer to the [facility] website for the visitor guidelines for Inpatients (after your surgery is over and you are in a regular room).    Your procedure is scheduled on: 01/23/24   Report to [facility] Main Entrance    Report to admitting at 10:45 AM   Call this number if you have problems the morning of surgery [phone]   Do not eat food or drink liquids :After Midnight.          If you have questions, please contact your surgeon's office.   FOLLOW BOWEL PREP AND ANY ADDITIONAL PRE OP INSTRUCTIONS YOU RECEIVED FROM YOUR SURGEON'S OFFICE!!!     Oral Hygiene is also important to reduce your risk of infection.                                    Remember - BRUSH YOUR TEETH THE MORNING OF SURGERY WITH YOUR REGULAR TOOTHPASTE  DENTURES WILL BE REMOVED PRIOR TO SURGERY PLEASE DO NOT APPLY Poly grip OR ADHESIVES!!!   Stop all vitamins and herbal supplements 7 days before surgery.   Take these medicines the morning of surgery with A SIP OF WATER: Amlodipine , Oxybutynin , Pravastatin , Tamsulosin   Reviewed and Endorsed by [facility] Patient Education Committee, August 2015                              You may not have any metal on your body including hair pins, jewelry, and body piercing             Do not wear make-up, lotions, powders, perfumes, or deodorant  Do  not wear nail polish including gel and S&S, artificial/acrylic nails, or any other type of covering on natural nails including finger and toenails. If you have artificial nails, gel coating, etc. that needs to be removed by a nail salon please have this removed prior to surgery or surgery may need to be canceled/ delayed if the surgeon/ anesthesia feels like they are unable to be safely monitored.   Do not shave  48 hours prior to surgery.    Do not bring valuables to the hospital. [place] IS NOT             RESPONSIBLE   FOR VALUABLES.   Contacts, glasses, dentures or bridgework may not be worn into surgery.  DO NOT BRING YOUR HOME MEDICATIONS TO THE HOSPITAL. PHARMACY WILL DISPENSE MEDICATIONS LISTED ON YOUR MEDICATION LIST TO YOU DURING YOUR ADMISSION IN THE HOSPITAL!    Patients discharged on the day of surgery will not be allowed to drive home.  Someone NEEDS to stay with you for the first 24 hours after anesthesia.   Special Instructions: Bring a copy of  your healthcare power of attorney and living will documents the day of surgery if you haven't scanned them before.              Please read over the following fact sheets you were given: IF YOU HAVE QUESTIONS ABOUT YOUR PRE-OP INSTRUCTIONS PLEASE CALL [phone] [place].   If you received a COVID test during your pre-op visit  it is requested that you wear a mask when out in public, stay away from anyone that may not be feeling well and notify your surgeon if you develop symptoms. If you test positive for Covid or have been in contact with anyone that has tested positive in the last 10 days please notify you surgeon.    [place] - Preparing for Surgery Before surgery, you can play an important role.  Because skin is not sterile, your skin needs to be as free of germs as possible.  You can reduce the number of germs on your skin by washing with CHG (chlorahexidine gluconate) soap before surgery.  CHG is an antiseptic cleaner which  kills germs and bonds with the skin to continue killing germs even after washing. Please DO NOT use if you have an allergy to CHG or antibacterial soaps.  If your skin becomes reddened/irritated stop using the CHG and inform your nurse when you arrive at Short Stay. Do not shave (including legs and underarms) for at least 48 hours prior to the first CHG shower.  You may shave your face/neck.  Please follow these instructions carefully:  1.  Shower with CHG Soap the night before surgery and the  morning of surgery.  2.  If you choose to wash your hair, wash your hair first as usual with your normal  shampoo.  3.  After you shampoo, rinse your hair and body thoroughly to remove the shampoo.                             4.  Use CHG as you would any other liquid soap.  You can apply chg directly to the skin and wash.  Gently with a scrungie or clean washcloth.  5.  Apply the CHG Soap to your body ONLY FROM THE NECK DOWN.   Do   not use on face/ open                           Wound or open sores. Avoid contact with eyes, ears mouth and   genitals (private parts).                       Wash face,  Genitals (private parts) with your normal soap.             6.  Wash thoroughly, paying special attention to the area where your    surgery  will be performed.  7.  Thoroughly rinse your body with warm water from the neck down.  8.  DO NOT shower/wash with your normal soap after using and rinsing off the CHG Soap.                9.  Pat yourself dry with a clean towel.            10.  Wear clean pajamas.            11.  Place clean sheets on your bed the night of  your first shower and do not  sleep with pets. Day of Surgery : Do not apply any lotions/deodorants the morning of surgery.  Please wear clean clothes to the hospital/surgery center.  FAILURE TO FOLLOW THESE INSTRUCTIONS MAY RESULT IN THE CANCELLATION OF YOUR SURGERY  PATIENT SIGNATURE_________________________________  NURSE  SIGNATURE__________________________________  ________________________________________________________________________

## 2024-01-03 ENCOUNTER — Other Ambulatory Visit: Payer: Self-pay

## 2024-01-03 ENCOUNTER — Encounter (HOSPITAL_COMMUNITY)
Admission: RE | Admit: 2024-01-03 | Discharge: 2024-01-03 | Disposition: A | Discharge status: Home / Self Care | Source: Ambulatory Visit | Attending: Urology | Admitting: Urology

## 2024-01-03 ENCOUNTER — Encounter (HOSPITAL_COMMUNITY): Payer: Self-pay

## 2024-01-03 VITALS — BP 142/101 | HR 94 | Temp 98.4°F | Resp 16 | Ht 66.0 in | Wt 229.0 lb

## 2024-01-03 DIAGNOSIS — Z01818 Encounter for other preprocedural examination: Secondary | ICD-10-CM | POA: Insufficient documentation

## 2024-01-03 DIAGNOSIS — E119 Type 2 diabetes mellitus without complications: Secondary | ICD-10-CM | POA: Insufficient documentation

## 2024-01-03 HISTORY — DX: Prediabetes: R73.03

## 2024-01-03 LAB — CBC
HCT: 37.9 % (ref 36.0–46.0)
Hemoglobin: 11.6 g/dL — ABNORMAL LOW (ref 12.0–15.0)
MCH: 27.6 pg (ref 26.0–34.0)
MCHC: 30.6 g/dL (ref 30.0–36.0)
MCV: 90 fL (ref 80.0–100.0)
Platelets: 271 K/uL (ref 150–400)
RBC: 4.21 MIL/uL (ref 3.87–5.11)
RDW: 13.5 % (ref 11.5–15.5)
WBC: 7.9 K/uL (ref 4.0–10.5)
nRBC: 0 % (ref 0.0–0.2)

## 2024-01-03 LAB — BASIC METABOLIC PANEL WITH GFR
Anion gap: 12 (ref 5–15)
BUN: 22 mg/dL — ABNORMAL HIGH (ref 6–20)
CO2: 21 mmol/L — ABNORMAL LOW (ref 22–32)
Calcium: 9.1 mg/dL (ref 8.9–10.3)
Chloride: 107 mmol/L (ref 98–111)
Creatinine, Ser: 0.88 mg/dL (ref 0.44–1.00)
GFR, Estimated: >60 mL/min (ref >60)
Glucose, Bld: 83 mg/dL (ref 70–99)
Potassium: 4.4 mmol/L (ref 3.5–5.1)
Sodium: 140 mmol/L (ref 135–145)

## 2024-01-06 ENCOUNTER — Emergency Department (HOSPITAL_COMMUNITY)
Admission: EM | Admit: 2024-01-06 | Discharge: 2024-01-06 | Disposition: A | Discharge status: Home / Self Care | Attending: Emergency Medicine | Admitting: Emergency Medicine

## 2024-01-06 ENCOUNTER — Other Ambulatory Visit: Payer: Self-pay

## 2024-01-06 ENCOUNTER — Encounter (HOSPITAL_COMMUNITY): Payer: Self-pay | Admitting: Emergency Medicine

## 2024-01-06 DIAGNOSIS — R030 Elevated blood-pressure reading, without diagnosis of hypertension: Secondary | ICD-10-CM | POA: Insufficient documentation

## 2024-01-06 DIAGNOSIS — Z720 Tobacco use: Secondary | ICD-10-CM | POA: Diagnosis not present

## 2024-01-06 DIAGNOSIS — I10 Essential (primary) hypertension: Secondary | ICD-10-CM | POA: Insufficient documentation

## 2024-01-06 DIAGNOSIS — Z79899 Other long term (current) drug therapy: Secondary | ICD-10-CM | POA: Insufficient documentation

## 2024-01-06 DIAGNOSIS — R319 Hematuria, unspecified: Secondary | ICD-10-CM | POA: Diagnosis not present

## 2024-01-06 DIAGNOSIS — R944 Abnormal results of kidney function studies: Secondary | ICD-10-CM | POA: Diagnosis not present

## 2024-01-06 DIAGNOSIS — N3090 Cystitis, unspecified without hematuria: Secondary | ICD-10-CM | POA: Insufficient documentation

## 2024-01-06 DIAGNOSIS — D649 Anemia, unspecified: Secondary | ICD-10-CM | POA: Insufficient documentation

## 2024-01-06 LAB — URINALYSIS, W/ REFLEX TO CULTURE (INFECTION SUSPECTED)
Bacteria, UA: NONE SEEN
Bilirubin Urine: NEGATIVE
Glucose, UA: NEGATIVE mg/dL
Ketones, ur: NEGATIVE mg/dL
Nitrite: NEGATIVE
Protein, ur: 100 mg/dL — AB
RBC / HPF: >50 RBC/hpf (ref 0–5)
Specific Gravity, Urine: 1.021 (ref 1.005–1.030)
WBC, UA: >50 WBC/hpf (ref 0–5)
pH: 6 (ref 5.0–8.0)

## 2024-01-06 LAB — CBC
HCT: 39.3 % (ref 36.0–46.0)
Hemoglobin: 11.6 g/dL — ABNORMAL LOW (ref 12.0–15.0)
MCH: 26.5 pg (ref 26.0–34.0)
MCHC: 29.5 g/dL — ABNORMAL LOW (ref 30.0–36.0)
MCV: 89.7 fL (ref 80.0–100.0)
Platelets: 299 K/uL (ref 150–400)
RBC: 4.38 MIL/uL (ref 3.87–5.11)
RDW: 13.3 % (ref 11.5–15.5)
WBC: 7.4 K/uL (ref 4.0–10.5)
nRBC: 0 % (ref 0.0–0.2)

## 2024-01-06 LAB — BASIC METABOLIC PANEL WITH GFR
Anion gap: 12 (ref 5–15)
BUN: 21 mg/dL — ABNORMAL HIGH (ref 6–20)
CO2: 23 mmol/L (ref 22–32)
Calcium: 9.4 mg/dL (ref 8.9–10.3)
Chloride: 105 mmol/L (ref 98–111)
Creatinine, Ser: 1.25 mg/dL — ABNORMAL HIGH (ref 0.44–1.00)
GFR, Estimated: 51 mL/min — ABNORMAL LOW (ref >60)
Glucose, Bld: 87 mg/dL (ref 70–99)
Potassium: 4.3 mmol/L (ref 3.5–5.1)
Sodium: 140 mmol/L (ref 135–145)

## 2024-01-06 MED ORDER — CEPHALEXIN 500 MG PO CAPS: 500.0000 mg | ORAL_CAPSULE | Freq: Two times a day (BID) | ORAL | 0 refills | Status: DC

## 2024-01-06 MED ORDER — HYDROCODONE-ACETAMINOPHEN 5-325 MG PO TABS: 1.0000 | ORAL_TABLET | Freq: Once | ORAL | Status: DC

## 2024-01-06 MED ORDER — HYDROCODONE-ACETAMINOPHEN 5-325 MG PO TABS: 1.0000 | ORAL_TABLET | ORAL | 0 refills | Status: DC | PRN

## 2024-01-06 MED ORDER — CEPHALEXIN 500 MG PO CAPS
500.0000 mg | ORAL_CAPSULE | Freq: Once | ORAL | Status: AC
  Administered 2024-01-06: 500 mg via ORAL
  Filled 2024-01-06: qty 1

## 2024-01-06 NOTE — ED Triage Notes (Signed)
 53 y/o female comes in c/o blood and blood clots in her urine that started today. Pt has a hx of a urological, but cannot recall her diagnosis. Pt reports UTI like symptoms, frequency, urgency stating  I also have to force the urine out. Pt denies any fevers, chills, n/v/d. Pt follows urology and has an appointment next week.

## 2024-01-06 NOTE — ED Provider Notes (Addendum)
 [place] EMERGENCY DEPARTMENT AT [facility] Provider Note   CSN: [id number] Arrival date & time: 01/06/24  1857     Patient presents with: Hematuria   Kayla [name] is a 53 y.o. female.   Patient is a 53 year old female who has a history of interstitial cystitis who presents with bladder pain.  She said that she has had some increased pain over the last couple of days.  Today she noticed that she had a little blood in her urine.  She had 1 episode where she passed a clot in her urine.  She has not had any ongoing hematuria.  No fevers.  No nausea or vomiting.  She describes the pain is in her bladder area.  She denies any flank or other abdominal pain.  No history of kidney stones.  She said she normally has some difficulty urinating and that she has to push her urine out.  This is not any different today than it has been in the past.       Prior to Admission medications   Medication Sig Start Date End Date Taking? Authorizing Provider  cephALEXin  (KEFLEX ) 500 MG capsule Take 1 capsule (500 mg total) by mouth 2 (two) times daily. 01/06/24  Yes Lenor Hollering, MD  HYDROcodone -acetaminophen  (NORCO/VICODIN) 5-325 MG tablet Take 1-2 tablets by mouth every 4 (four) hours as needed. 01/06/24  Yes Lenor Hollering, MD  amLODipine  (NORVASC ) 2.5 MG tablet Take 1 tablet (2.5 mg total) by mouth daily. 11/15/23 11/14/24  [place], Betty G, MD  cetirizine  (ZYRTEC ) 10 MG tablet Take 1 tablet (10 mg total) by mouth daily. Patient not taking: Reported on 01/03/2024 08/27/18   Luke Chiquita SAUNDERS, DO  Meth-Hyo-M Bl-Benz Acd-Ph Sal (URIBEL ) 81.6 MG TABS Take 1 tablet by mouth 4 (four) times daily. Patient not taking: Reported on 01/03/2024 10/06/23     naproxen sodium (ALEVE) 220 MG tablet Take 440 mg by mouth 2 (two) times daily as needed (pain).    [provider]  oxybutynin  (DITROPAN -XL) 10 MG 24 hr tablet Take 1 tablet (10 mg total) by mouth daily. 10/06/23     oxybutynin  (DITROPAN -XL) 10 MG  24 hr tablet Take 1 tablet (10 mg total) by mouth daily. 12/19/23     pentosan polysulfate (ELMIRON ) 100 MG capsule Take 2 capsules (200 mg total) by mouth 2 (two) times daily. 01/13/23     pravastatin  (PRAVACHOL ) 20 MG tablet Take 1 tablet (20 mg total) by mouth daily. 11/17/23   [place], Betty G, MD  tamsulosin  (FLOMAX ) 0.4 MG CAPS capsule Take 1 capsule (0.4 mg total) by mouth daily. 04/11/23     tamsulosin  (FLOMAX ) 0.4 MG CAPS capsule Take 1 capsule (0.4 mg total) by mouth daily. 12/19/23       Allergies: Patient has no known allergies.    Review of Systems  Constitutional:  Negative for chills, diaphoresis, fatigue and fever.  HENT:  Negative for congestion, rhinorrhea and sneezing.   Eyes: Negative.   Respiratory:  Negative for cough, chest tightness and shortness of breath.   Cardiovascular:  Negative for chest pain and leg swelling.  Gastrointestinal:  Positive for abdominal pain. Negative for diarrhea, nausea and vomiting.  Genitourinary:  Positive for difficulty urinating and hematuria. Negative for flank pain and frequency.  Musculoskeletal:  Positive for back pain (Mild aching across her lower back bilaterally). Negative for arthralgias.  Skin:  Negative for rash.  Neurological:  Negative for dizziness, speech difficulty, weakness, numbness and headaches.  Updated Vital Signs BP (!) 146/104   Pulse 95   Temp 97.7 F (36.5 C) (Oral)   Resp 17   Ht 5' 6 (1.676 m)   Wt 104.8 kg   LMP 08/02/2013   SpO2 99%   BMI 37.28 kg/m   Physical Exam Constitutional:      Appearance: She is well-developed.  HENT:     Head: Normocephalic and atraumatic.  Eyes:     Pupils: Pupils are equal, round, and reactive to light.  Cardiovascular:     Rate and Rhythm: Normal rate and regular rhythm.     Heart sounds: Normal heart sounds.  Pulmonary:     Effort: Pulmonary effort is normal. No respiratory distress.     Breath sounds: Normal breath sounds. No wheezing or rales.  Chest:      Chest wall: No tenderness.  Abdominal:     General: Bowel sounds are normal.     Palpations: Abdomen is soft.     Tenderness: There is abdominal tenderness (Suprapubic tenderness). There is no guarding or rebound.  Musculoskeletal:        General: Normal range of motion.     Cervical back: Normal range of motion and neck supple.  Lymphadenopathy:     Cervical: No cervical adenopathy.  Skin:    General: Skin is warm and dry.     Findings: No rash.  Neurological:     Mental Status: She is alert and oriented to person, place, and time.     (all labs ordered are listed, but only abnormal results are displayed) Labs Reviewed  BASIC METABOLIC PANEL WITH GFR - Abnormal; Notable for the following components:      Result Value   BUN 21 (*)    Creatinine, Ser 1.25 (*)    GFR, Estimated 51 (*)    All other components within normal limits  CBC - Abnormal; Notable for the following components:   Hemoglobin 11.6 (*)    MCHC 29.5 (*)    All other components within normal limits  URINALYSIS, W/ REFLEX TO CULTURE (INFECTION SUSPECTED) - Abnormal; Notable for the following components:   APPearance CLOUDY (*)    Hgb urine dipstick LARGE (*)    Protein, ur 100 (*)    Leukocytes,Ua LARGE (*)    All other components within normal limits  URINE CULTURE    EKG: None  Radiology: No results found.   Procedures   Medications Ordered in the ED  cephALEXin  (KEFLEX ) capsule 500 mg (500 mg Oral Given 01/06/24 2148)                                    Medical Decision Making Amount and/or Complexity of Data Reviewed Labs: ordered.  Risk Prescription drug management.   This patient presents to the ED for concern of hematuria, abdominal pain, this involves an extensive number of treatment options, and is a complaint that carries with it a high risk of complications and morbidity.  I considered the following differential and admission for this acute, potentially life threatening condition.   The differential diagnosis includes cystitis, kidney stone, bladder mass, urinary retention, vaginal bleeding  MDM:    Patient is a 53 year old who presents with hematuria and increased bladder pain.  She has a history of interstitial cystitis.  She is comfortable, in no acute distress.  Vital signs are stable.  She has some mild tenderness to her suprapubic  area.  No fever.  Her urine does look concerning for infection.  There is also some hematuria.  Discussed the possibility that she could have a kidney stone although she does not have symptoms that sound more concerning for renal colic.  Discussed doing a abdominal CT.  At this point she feels like her symptoms are consistent with her interstitial cystitis and does not want to do a CT scan.  I feel like this is appropriate.  Her urine was sent for culture.  She was started on Keflex .  Her creatinine is mildly elevated.  Advised that she will need to follow-up with her PCP to have this rechecked.  She was discharged home in good condition.  Return precautions were given.   Her blood pressure was elevated as well.  She is asymptomatic.  Was advised to have this rechecked by her primary care doctor. (Labs, imaging, consults)  Labs: I Ordered, and personally interpreted labs.  The pertinent results include: Mild elevation in her creatinine, mild anemia, UTI  Imaging Studies ordered: I ordered imaging studies including none I independently visualized and interpreted imaging. I agree with the radiologist interpretation  Additional history obtained from chart.  External records from outside source obtained and reviewed including her notes  Cardiac Monitoring: The patient was not maintained on a cardiac monitor.  If on the cardiac monitor, I personally viewed and interpreted the cardiac monitored which showed an underlying rhythm of:    Reevaluation: After the interventions noted above, I reevaluated the patient and found that they have :stayed  the same  Social Determinants of Health:    Disposition: Discharged to home  Co morbidities that complicate the patient evaluation  Past Medical History:  Diagnosis Date   Allergy    Anemia    Arthritis    lt knee   Back pain    Breast cyst, right    Constipation    Dyspnea    Fatigue    GERD (gastroesophageal reflux disease)    Hyperlipidemia    Hypertension    Joint pain    Leg edema    Obesity    Pre-diabetes    Sleep apnea    lost weight 2013 gastric sleeve, no longer a problem   Sleep disturbance    SVD (spontaneous vaginal delivery)    x 3   Tobacco use      Medicines Meds ordered this encounter  Medications   cephALEXin  (KEFLEX ) capsule 500 mg   DISCONTD: HYDROcodone -acetaminophen  (NORCO/VICODIN) 5-325 MG per tablet 1 tablet    Refill:  0   cephALEXin  (KEFLEX ) 500 MG capsule    Sig: Take 1 capsule (500 mg total) by mouth 2 (two) times daily.    Dispense:  14 capsule    Refill:  0   HYDROcodone -acetaminophen  (NORCO/VICODIN) 5-325 MG tablet    Sig: Take 1-2 tablets by mouth every 4 (four) hours as needed.    Dispense:  10 tablet    Refill:  0    I have reviewed the patients home medicines and have made adjustments as needed  Problem List / ED Course: Problem List Items Addressed This Visit   None Visit Diagnoses       Cystitis    -  Primary                Final diagnoses:  Cystitis    ED Discharge Orders          Ordered    cephALEXin  (KEFLEX ) 500 MG capsule  2 times daily        01/06/24 2135    HYDROcodone -acetaminophen  (NORCO/VICODIN) 5-325 MG tablet  Every 4 hours PRN        01/06/24 2159               Lenor Hollering, MD 01/06/24 2206    Lenor Hollering, MD 01/06/24 2226

## 2024-01-06 NOTE — Discharge Instructions (Addendum)
 Follow-up with your urologist as discussed.  Return to the emergency room if you have any worsening symptoms.  Also, your creatinine (kidney function) was slightly out of the normal range.  This should be rechecked by your doctor.  Your blood pressure was also elevated and should be rechecked by your primary care doctor.

## 2024-01-08 LAB — URINE CULTURE: Culture: >=100000 — AB

## 2024-01-09 ENCOUNTER — Telehealth (HOSPITAL_BASED_OUTPATIENT_CLINIC_OR_DEPARTMENT_OTHER): Payer: Self-pay

## 2024-01-09 NOTE — Telephone Encounter (Signed)
 Post ED Visit - Positive Culture Follow-up  Culture report reviewed by antimicrobial stewardship pharmacist: [facility] []  Rankin Dee, Pharm.D. []  Venetia Gully, Pharm.D., BCPS AQ-ID []  Garrel Crews, Pharm.D., BCPS []  Almarie Lunger, Pharm.D., BCPS []  [place], [address].D., BCPS, AAHIVP []  Rosaline Bihari, Pharm.D., BCPS, AAHIVP []  Vernell Meier, PharmD, BCPS []  Latanya Hint, PharmD, BCPS []  Donald Medley, PharmD, BCPS []  Rocky Bold, PharmD []  Dorothyann Alert, PharmD, BCPS []  Morene Babe, PharmD  [facility] []  Rosaline Edison, PharmD []  Romona Bliss, PharmD []  Dolphus Roller, PharmD []  Veva Seip, Rph []  Vernell Daunt) Leonce, PharmD []  Eva Allis, PharmD []  Rosaline Millet, PharmD []  Iantha Batch, PharmD []  Arvin Gauss, PharmD []  Wanda Hasting, PharmD []  Ronal Rav, PharmD []  Rocky Slade, PharmD []  Bard Jeans, PharmD [x]  Almarie Lunger, Pharm.D., BCPS  Positive urine culture Treated with Cephalexin , organism sensitive to the same and no further patient follow-up is required at this time.  Chart appended  Gretta Avelina Buss 01/09/2024, 9:04 AM

## 2024-01-10 DIAGNOSIS — R8271 Bacteriuria: Secondary | ICD-10-CM | POA: Diagnosis not present

## 2024-01-10 DIAGNOSIS — N3010 Interstitial cystitis (chronic) without hematuria: Secondary | ICD-10-CM | POA: Diagnosis not present

## 2024-01-10 DIAGNOSIS — N3020 Other chronic cystitis without hematuria: Secondary | ICD-10-CM | POA: Diagnosis not present

## 2024-01-10 DIAGNOSIS — R102 Pelvic and perineal pain: Secondary | ICD-10-CM | POA: Diagnosis not present

## 2024-01-19 NOTE — Anesthesia Preprocedure Evaluation (Addendum)
 Anesthesia Evaluation  Patient identified by MRN, date of birth, ID band Patient awake    Reviewed: Allergy & Precautions, NPO status , Patient's Chart, lab work & pertinent test results  Airway Mallampati: III  TM Distance: >3 FB Neck ROM: Full    Dental  (+) Teeth Intact, Dental Advisory Given   Pulmonary sleep apnea (stopped using cpap after weight loss) , former smoker Quit smoking 2022, 25 pack year history    Pulmonary exam normal breath sounds clear to auscultation       Cardiovascular hypertension (135/94 preop), Pt. on medications Normal cardiovascular exam Rhythm:Regular Rate:Normal     Neuro/Psych negative neurological ROS     GI/Hepatic Neg liver ROS,GERD  Controlled,,S/p gastric sleeve   Endo/Other  negative endocrine ROS  BMI 37  Renal/GU Renal InsufficiencyRenal diseaseCr 1.25     Musculoskeletal negative musculoskeletal ROS (+)    Abdominal  (+) + obese  Peds  Hematology  (+) Blood dyscrasia, anemia Hb 11.6, plt 299   Anesthesia Other Findings   Reproductive/Obstetrics                              Anesthesia Physical Anesthesia Plan  ASA: 2  Anesthesia Plan: General   Post-op Pain Management: Tylenol  PO (pre-op)*   Induction: Intravenous  PONV Risk Score and Plan: 3 and Ondansetron , Dexamethasone , Midazolam  and Treatment may vary due to age or medical condition  Airway Management Planned: LMA  Additional Equipment: None  Intra-op Plan:   Post-operative Plan: Extubation in OR  Informed Consent: I have reviewed the patients History and Physical, chart, labs and discussed the procedure including the risks, benefits and alternatives for the proposed anesthesia with the patient or authorized representative who has indicated his/her understanding and acceptance.     Dental advisory given  Plan Discussed with: CRNA  Anesthesia Plan Comments:           Anesthesia Quick Evaluation

## 2024-01-19 NOTE — H&P (Signed)
 Reviewed lengthy note. Patient has interstitial cystitis. Last time I saw her I put her on a trial of Flomax  and consult with physical therapy. She was on Gemtesa for overactive bladder helping some of her discomfort.   Patient says physical therapy and Flomax  really helped. She is really not having pain. She is off the [place] and she does not think she needs it. Flow improved. No pain. Frequency stable. 30 x 11 Flomax  sent to pharmacy and I will see in a year   Today  Patient was seen Oct 06, 2023 by extender with burning and suprapubic pressure. She has been on oxybutynin  Elmiron  Gemtesa and Flomax  in the past and when seen she was on Elmiron  and Flomax  she was put on back on oxybutynin . She said it was helping her bladder spasms. Uribel  is helpful sometimes uses AZO standard. There was question whether or not she would have another hydrodistention for pain but when I looked at medical records the hydrodistention did not help her last time. Urine culture in May was negative and there was no urine culture from July   Urine looked normal but sent for culture   The patient says the pain is worse than it has been in the past. I do not think she is infected. She wants another hydrodistention because the pain is worse and treatment options are limited. She will do rescue treatment. We will give rescue treatment 2 or 3 times a week for at least 2 weeks and reassess her. Call if culture positive. I believe she is currently on oxybutynin  and Elmiron  and Flomax . I thought was reasonable to also give her some Gemtesa samples today. We will suggest more physical therapy   She want to hold off on Uribel . She does not like the color. She held off on physical therapy but all other prescriptions renewed   01/10/2024: Patient returns today in follow-up. She presented to the ED on 01/06/2024 for bladder pain and gross hematuria with clots. UA was concerning for infection, she was started on 7-day course of Keflex  as  well as given hydrocodone  for severe pain. Culture positive for E. coli with pansensitivity except intermediate to cipro . She notes the hematuria, suprapubic pain, dysuria has resolved. She does continue with a scratching sensation of her clitoris, she says this has been improving but is bothersome. She recently had 4 bladder installations but she says she had minor improvement with. She is scheduled for hydrodistention on 01/23/24.     ALLERGIES: No Known Drug Allergies    MEDICATIONS: oxyBUTYnin  Chloride ER 10 MG Tablet Extended Release 24 Hour 1 tablet PO Daily  Percocet 5-325 MG Tablet 1-2 tablet PO Q 6 H PRN  Tamsulosin  Hcl 1 capsule PO Daily  Tamsulosin  Hcl 1 capsule PO Daily  Tamsulosin  HCl 0.4 MG Capsule 1 capsule PO Daily  Tamsulosin  HCl 0.4 MG Capsule 1 capsule PO Daily  amLODIPine  Bes+SyrSpend SF 1 tablet PO Daily  Elmiron  100 MG Capsule 2 capsule PO BID  Uribel  81.6 MG Tablet 1 tablet PO QID PRN     GU PSH: Cystoscopy - 11/17/2022, 2018 Hysterectomy       PSH Notes: weight loss    NON-GU PSH: Visit Complexity (formerly GPC1X) - 11/28/2023, 10/06/2023, 08/10/2023, 04/11/2023, 12/22/2022, 11/17/2022     GU PMH: Chronic cystitis (w/o hematuria) - 12/29/2023, - 12/27/2023, - 12/25/2023, - 12/22/2023, - 12/19/2023, - 11/28/2023, - 08/10/2023, - 04/11/2023, - 01/13/2023, - 12/22/2022, - 11/17/2022 Interstitial Cystitis (w/o hematuria) - 12/22/2023, - 11/28/2023, -  10/06/2023 Urinary Frequency (Stable) - 12/19/2023, - 10/06/2023, - 08/10/2023, - 04/11/2023, - 01/13/2023, - 12/22/2022, - 11/17/2022 (Stable), - 2021, - 2018 Pelvic/perineal pain - 11/28/2023, - 10/06/2023, - 08/15/2023, - 08/09/2023 Weak Urinary Stream - 08/10/2023 Lower abdominal pain, unspecified - 07/04/2023 RLQ pain - 11/30/2022, - 11/17/2022 Nocturia - 2021 Urinary Tract Inf, Unspec site (Stable) - 2021, - 2018 Urge incontinence - 2018 Stress Incontinence - 2018    NON-GU PMH: Constipation, unspecified - 08/15/2023, - 08/09/2023 Hypertrophic scar  - 08/15/2023, - 08/09/2023 Muscle weakness (generalized) - 08/15/2023, - 08/09/2023, - 07/04/2023 Other muscle spasm - 08/15/2023, - 08/09/2023, - 07/04/2023 Other specified disorders of muscle - 08/15/2023, - 08/09/2023, - 07/04/2023 Low back pain, unspecified - 07/04/2023 Arthritis Hypertension    FAMILY HISTORY: 1 Daughter - Daughter 3 Son's - Son Deceased - Father   SOCIAL HISTORY: Marital Status: Married Preferred Language: English; Ethnicity: Not Hispanic Or Latino; Race: Black or African American Current Smoking Status: Patient does not smoke anymore. Smoked for 17 years.   Tobacco Use Assessment Completed: Used Tobacco in last 30 days? Does not use smokeless tobacco. Does not use drugs. Drinks 1 caffeinated drink per day. Has not had a blood transfusion. 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    REVIEW OF SYSTEMS:    GU Review Female:   Patient reports frequent urination, hard to postpone urination, burning /pain with urination, and get up at night to urinate. Patient denies leakage of urine, stream starts and stops, trouble starting your stream, have to strain to urinate, and being pregnant.  Gastrointestinal (Upper):   Patient denies nausea, vomiting, and indigestion/ heartburn.  Gastrointestinal (Lower):   Patient denies diarrhea and constipation.  Constitutional:   Patient denies fever, night sweats, weight loss, and fatigue.  Skin:   Patient denies itching and skin rash/ lesion.  Eyes:   Patient denies blurred vision and double vision.  Ears/ Nose/ Throat:   Patient denies sore throat and sinus problems.  Hematologic/Lymphatic:   Patient denies swollen glands and easy bruising.  Cardiovascular:   Patient denies leg swelling and chest pains.  Respiratory:   Patient denies cough and shortness of breath.  Endocrine:   Patient denies excessive thirst.  Musculoskeletal:   Patient denies back pain and joint pain.  Neurological:   Patient denies headaches and dizziness.   Psychologic:   Patient denies depression and anxiety.   VITAL SIGNS:      01/10/2024 08:08 AM  BP 120/85 mmHg  Pulse 89 /min  Temperature 97.5 F / 36.3 C   MULTI-SYSTEM PHYSICAL EXAMINATION:    Constitutional: Well-nourished. No physical deformities. Normally developed. Good grooming.  Neck: Neck symmetrical, not swollen. Normal tracheal position.  Respiratory: No labored breathing, no use of accessory muscles.   Neurologic / Psychiatric: Oriented to time, oriented to place, oriented to person. No depression, no anxiety, no agitation.  Gastrointestinal: No mass, no tenderness, no rigidity, non obese abdomen.  Musculoskeletal: Normal gait and station of head and neck.     Complexity of Data:  Source Of History:  Patient, Medical Record Summary  Records Review:   Previous Doctor Records, Previous Hospital Records, Previous Patient Records  Urine Test Review:   Urinalysis, Urine Culture   01/10/24  Urinalysis  Urine Appearance Slightly Cloudy   Urine Color Yellow   Urine Glucose Neg mg/dL  Urine Bilirubin Neg mg/dL  Urine Ketones Neg mg/dL  Urine Specific Gravity 1.025   Urine Blood 3+ ery/uL  Urine pH 6.5   Urine Protein  Trace mg/dL  Urine Urobilinogen 0.2 mg/dL  Urine Nitrites Neg   Urine Leukocyte Esterase 1+ leu/uL  Urine WBC/hpf 6 - 10/hpf   Urine RBC/hpf 10 - 20/hpf   Urine Epithelial Cells 6 - 10/hpf   Urine Bacteria Few (10-25/hpf)   Urine Mucous Not Present   Urine Yeast NS (Not Seen)   Urine Trichomonas Not Present   Urine Cystals NS (Not Seen)   Urine Casts NS (Not Seen)   Urine Sperm Not Present    PROCEDURES:          Visit Complexity - G2211          Urinalysis w/Scope Dipstick Dipstick Cont'd Micro  Color: Yellow Bilirubin: Neg mg/dL WBC/hpf: 6 - 89/yeq  Appearance: Slightly Cloudy Ketones: Neg mg/dL RBC/hpf: 10 - 79/yeq  Specific Gravity: 1.025 Blood: 3+ ery/uL Bacteria: Few (10-25/hpf)  pH: 6.5 Protein: Trace mg/dL Cystals: NS (Not Seen)   Glucose: Neg mg/dL Urobilinogen: 0.2 mg/dL Casts: NS (Not Seen)    Nitrites: Neg Trichomonas: Not Present    Leukocyte Esterase: 1+ leu/uL Mucous: Not Present      Epithelial Cells: 6 - 10/hpf      Yeast: NS (Not Seen)      Sperm: Not Present    ASSESSMENT:      ICD-10 Details  1 GU:   Chronic cystitis (w/o hematuria) - N30.20 Chronic, Exacerbation  2   Interstitial Cystitis (w/o hematuria) - N30.10 Chronic, Exacerbation  3   Pelvic/perineal pain - R10.2 Chronic, Exacerbation   PLAN:           Orders Labs Urine Culture          Schedule Return Visit/Planned Activity: Keep Scheduled Appointment          Document Letter(s):  Created for Patient: Clinical Summary         Notes:   She is currently on appropriate antimicrobial therapy for her infection with 3 days remaining. I encouraged her to increase hydration, wear loosefitting clothing, take NSAIDs as needed. She is scheduled for hydrodistention on September 16, she understands if her symptoms do not resolve or worsen after completion of antibiotics that she should return for repeat culture prior to her procedure.        Next Appointment:      Next Appointment: 01/23/2024 01:00 PM    Appointment Type: Surgery

## 2024-01-23 ENCOUNTER — Encounter (HOSPITAL_COMMUNITY)
Admission: RE | Disposition: A | Discharge status: Home / Self Care | Payer: Self-pay | Source: Home / Self Care | Attending: Urology

## 2024-01-23 ENCOUNTER — Encounter (HOSPITAL_COMMUNITY): Payer: Self-pay | Admitting: Urology

## 2024-01-23 ENCOUNTER — Ambulatory Visit (HOSPITAL_COMMUNITY): Payer: Self-pay | Admitting: Anesthesiology

## 2024-01-23 ENCOUNTER — Ambulatory Visit (HOSPITAL_COMMUNITY): Payer: Self-pay | Admitting: Medical

## 2024-01-23 ENCOUNTER — Ambulatory Visit (HOSPITAL_COMMUNITY)
Admission: RE | Admit: 2024-01-23 | Discharge: 2024-01-23 | Disposition: A | Discharge status: Home / Self Care | Attending: Urology | Admitting: Urology

## 2024-01-23 DIAGNOSIS — N3010 Interstitial cystitis (chronic) without hematuria: Secondary | ICD-10-CM | POA: Insufficient documentation

## 2024-01-23 DIAGNOSIS — R102 Pelvic and perineal pain: Secondary | ICD-10-CM | POA: Diagnosis not present

## 2024-01-23 DIAGNOSIS — Z79899 Other long term (current) drug therapy: Secondary | ICD-10-CM | POA: Insufficient documentation

## 2024-01-23 DIAGNOSIS — E119 Type 2 diabetes mellitus without complications: Secondary | ICD-10-CM

## 2024-01-23 HISTORY — PX: CYSTO WITH HYDRODISTENSION: SHX5453

## 2024-01-23 SURGERY — CYSTOSCOPY, WITH BLADDER HYDRODISTENSION
Anesthesia: General

## 2024-01-23 MED ORDER — ONDANSETRON HCL 4 MG/2ML IJ SOLN: 4.0000 mg | Freq: Once | INTRAMUSCULAR | Status: DC | PRN

## 2024-01-23 MED ORDER — FENTANYL CITRATE (PF) 100 MCG/2ML IJ SOLN
INTRAMUSCULAR | Status: DC | PRN
  Administered 2024-01-23 (×2): 25 ug via INTRAVENOUS

## 2024-01-23 MED ORDER — BUPIVACAINE HCL (PF) 0.25 % IJ SOLN
INTRAMUSCULAR | Status: AC
Start: 2024-01-23 — End: 2024-01-23
  Filled 2024-01-23: qty 30

## 2024-01-23 MED ORDER — LACTATED RINGERS IV SOLN
INTRAVENOUS | Status: DC
Start: 2024-01-23 — End: 2024-01-23

## 2024-01-23 MED ORDER — HYDROMORPHONE HCL 1 MG/ML IJ SOLN
INTRAMUSCULAR | Status: AC
  Filled 2024-01-23: qty 1

## 2024-01-23 MED ORDER — OXYCODONE HCL 5 MG/5ML PO SOLN: 5.0000 mg | Freq: Once | ORAL | Status: AC | PRN

## 2024-01-23 MED ORDER — OXYCODONE HCL 5 MG PO TABS
ORAL_TABLET | ORAL | Status: AC
  Filled 2024-01-23: qty 1

## 2024-01-23 MED ORDER — LIDOCAINE 2% (20 MG/ML) 5 ML SYRINGE
INTRAMUSCULAR | Status: DC | PRN
  Administered 2024-01-23: 70 mg via INTRAVENOUS

## 2024-01-23 MED ORDER — CEFAZOLIN SODIUM 1 G IJ SOLR
INTRAMUSCULAR | Status: AC
  Filled 2024-01-23: qty 20

## 2024-01-23 MED ORDER — ORAL CARE MOUTH RINSE: 15.0000 mL | Freq: Once | OROMUCOSAL | Status: AC

## 2024-01-23 MED ORDER — HYDROMORPHONE HCL 1 MG/ML IJ SOLN
0.2500 mg | INTRAMUSCULAR | Status: DC | PRN
  Administered 2024-01-23 (×2): 0.5 mg via INTRAVENOUS

## 2024-01-23 MED ORDER — PHENYLEPHRINE 80 MCG/ML (10ML) SYRINGE FOR IV PUSH (FOR BLOOD PRESSURE SUPPORT)
PREFILLED_SYRINGE | INTRAVENOUS | Status: DC | PRN
  Administered 2024-01-23: 160 ug via INTRAVENOUS
  Administered 2024-01-23 (×2): 80 ug via INTRAVENOUS

## 2024-01-23 MED ORDER — AMISULPRIDE (ANTIEMETIC) 5 MG/2ML IV SOLN: 10.0000 mg | Freq: Once | INTRAVENOUS | Status: DC | PRN

## 2024-01-23 MED ORDER — CEFAZOLIN SODIUM-DEXTROSE 2-3 GM-%(50ML) IV SOLR
INTRAVENOUS | Status: DC | PRN
  Administered 2024-01-23: 2 g via INTRAVENOUS

## 2024-01-23 MED ORDER — FENTANYL CITRATE (PF) 100 MCG/2ML IJ SOLN
INTRAMUSCULAR | Status: AC
  Filled 2024-01-23: qty 2

## 2024-01-23 MED ORDER — STERILE WATER FOR IRRIGATION IR SOLN
Status: DC | PRN
  Administered 2024-01-23: 3000 mL

## 2024-01-23 MED ORDER — MIDAZOLAM HCL 2 MG/2ML IJ SOLN
INTRAMUSCULAR | Status: AC
  Filled 2024-01-23: qty 2

## 2024-01-23 MED ORDER — OXYCODONE HCL 5 MG PO TABS
5.0000 mg | ORAL_TABLET | Freq: Once | ORAL | Status: AC | PRN
  Administered 2024-01-23: 5 mg via ORAL

## 2024-01-23 MED ORDER — CIPROFLOXACIN IN D5W 400 MG/200ML IV SOLN
400.0000 mg | INTRAVENOUS | Status: AC
Start: 2024-01-23 — End: 2024-01-23
  Administered 2024-01-23: 400 mg via INTRAVENOUS
  Filled 2024-01-23: qty 200

## 2024-01-23 MED ORDER — BUPIVACAINE HCL (PF) 0.25 % IJ SOLN
INTRAMUSCULAR | Status: DC | PRN
  Administered 2024-01-23: 30 mL

## 2024-01-23 MED ORDER — PROPOFOL 10 MG/ML IV BOLUS
INTRAVENOUS | Status: AC
  Filled 2024-01-23: qty 20

## 2024-01-23 MED ORDER — CHLORHEXIDINE GLUCONATE 0.12 % MT SOLN
15.0000 mL | Freq: Once | OROMUCOSAL | Status: AC
  Administered 2024-01-23: 15 mL via OROMUCOSAL

## 2024-01-23 MED ORDER — DEXAMETHASONE SODIUM PHOSPHATE 10 MG/ML IJ SOLN
INTRAMUSCULAR | Status: AC
Start: 2024-01-23 — End: 2024-01-23
  Filled 2024-01-23: qty 1

## 2024-01-23 MED ORDER — LIDOCAINE HCL (CARDIAC) PF 100 MG/5ML IV SOSY
PREFILLED_SYRINGE | INTRAVENOUS | Status: DC | PRN
  Administered 2024-01-23: 80 mg via INTRAVENOUS

## 2024-01-23 MED ORDER — ONDANSETRON HCL 4 MG/2ML IJ SOLN
INTRAMUSCULAR | Status: DC | PRN
Start: 2024-01-23 — End: 2024-01-23
  Administered 2024-01-23: 4 mg via INTRAVENOUS

## 2024-01-23 MED ORDER — DEXAMETHASONE SODIUM PHOSPHATE 10 MG/ML IJ SOLN
INTRAMUSCULAR | Status: DC | PRN
  Administered 2024-01-23: 10 mg via INTRAVENOUS

## 2024-01-23 MED ORDER — ACETAMINOPHEN 500 MG PO TABS
1000.0000 mg | ORAL_TABLET | Freq: Once | ORAL | Status: AC
Start: 2024-01-23 — End: 2024-01-23
  Administered 2024-01-23: 1000 mg via ORAL
  Filled 2024-01-23: qty 2

## 2024-01-23 MED ORDER — PROPOFOL 10 MG/ML IV BOLUS
INTRAVENOUS | Status: DC | PRN
  Administered 2024-01-23: 150 mg via INTRAVENOUS

## 2024-01-23 MED ORDER — LIDOCAINE HCL (PF) 2 % IJ SOLN
INTRAMUSCULAR | Status: AC
  Filled 2024-01-23: qty 5

## 2024-01-23 MED ORDER — MIDAZOLAM HCL 5 MG/5ML IJ SOLN
INTRAMUSCULAR | Status: DC | PRN
  Administered 2024-01-23: 2 mg via INTRAVENOUS

## 2024-01-23 MED ORDER — EPHEDRINE SULFATE-NACL 50-0.9 MG/10ML-% IV SOSY
PREFILLED_SYRINGE | INTRAVENOUS | Status: DC | PRN
  Administered 2024-01-23: 10 mg via INTRAVENOUS
  Administered 2024-01-23: 5 mg via INTRAVENOUS

## 2024-01-23 MED ORDER — ONDANSETRON HCL 4 MG/2ML IJ SOLN
INTRAMUSCULAR | Status: AC
  Filled 2024-01-23: qty 2

## 2024-01-23 SURGICAL SUPPLY — 10 items
BAG URO CATCHER STRL LF (MISCELLANEOUS) ×1 IMPLANT
CATH ROBINSON RED A/P 16FR (CATHETERS) IMPLANT
ELECT REM PT RETURN 15FT ADLT (MISCELLANEOUS) IMPLANT
GLOVE SURG LX STRL 7.5 STRW (GLOVE) ×1 IMPLANT
GOWN STRL REUS W/ TWL XL LVL3 (GOWN DISPOSABLE) ×1 IMPLANT
KIT TURNOVER KIT A (KITS) ×1 IMPLANT
NDL SAFETY ECLIPSE 18X1.5 (NEEDLE) IMPLANT
PACK CYSTO (CUSTOM PROCEDURE TRAY) ×1 IMPLANT
SYR 30ML LL (SYRINGE) IMPLANT
TUBING CONNECTING 10 (TUBING) ×1 IMPLANT

## 2024-01-23 NOTE — Transfer of Care (Signed)
 Immediate Anesthesia Transfer of Care Note  Patient: Kayla [name]  Procedure(s) Performed: CYSTOSCOPY, WITH BLADDER HYDRODISTENSION  Patient Location: PACU  Anesthesia Type:General  Level of Consciousness: awake, alert , oriented, and patient cooperative  Airway & Oxygen Therapy: Patient Spontanous Breathing and Patient connected to face mask oxygen  Post-op Assessment: Report given to RN and Post -op Vital signs reviewed and stable  Post vital signs: Reviewed and stable  Last Vitals:  Vitals Value Taken Time  BP 129/99 01/23/24 12:45  Temp 36.5 C 01/23/24 12:45  Pulse 102 01/23/24 12:46  Resp 17 01/23/24 12:46  SpO2 100 % 01/23/24 12:46  Vitals shown include unfiled device data.  Last Pain:  Vitals:   01/23/24 1124  TempSrc:   PainSc: 4       Patients Stated Pain Goal: 3 (01/23/24 1124)  Complications: No notable events documented.

## 2024-01-23 NOTE — Discharge Instructions (Signed)
 I have reviewed discharge instructions in detail with the patient. They will follow-up with me or their physician as scheduled. My nurse will also be calling the patients as per protocol.   Patient has pain medicine at home and I gave her intravenous antibiotics.  We will call and check on her tomorrow.  Normal activity starting tomorrow

## 2024-01-23 NOTE — Anesthesia Postprocedure Evaluation (Signed)
 Anesthesia Post Note  Patient: Kayla [name]  Procedure(s) Performed: CYSTOSCOPY, WITH BLADDER HYDRODISTENSION     Patient location during evaluation: PACU Anesthesia Type: General Level of consciousness: awake and alert, oriented and patient cooperative Pain management: pain level controlled Vital Signs Assessment: post-procedure vital signs reviewed and stable Respiratory status: spontaneous breathing, nonlabored ventilation and respiratory function stable Cardiovascular status: blood pressure returned to baseline and stable Postop Assessment: no apparent nausea or vomiting Anesthetic complications: no   No notable events documented.  Last Vitals:  Vitals:   01/23/24 1315 01/23/24 1330  BP: (!) 145/99 (!) 144/99  Pulse: 87 84  Resp: 18 15  Temp: 36.4 C   SpO2: 97% 94%    Last Pain:  Vitals:   01/23/24 1330  TempSrc:   PainSc: 2                  Almarie CHRISTELLA Marchi

## 2024-01-23 NOTE — Interval H&P Note (Signed)
 History and Physical Interval Note:  01/23/2024 11:46 AM  Kayla [name]  has presented today for surgery, with the diagnosis of PELVIC PAIN.  The various methods of treatment have been discussed with the patient and family. After consideration of risks, benefits and other options for treatment, the patient has consented to  Procedure(s): CYSTOSCOPY, WITH BLADDER HYDRODISTENSION (N/A) as a surgical intervention.  The patient's history has been reviewed, patient examined, no change in status, stable for surgery.  I have reviewed the patient's chart and labs.  Questions were answered to the patient's satisfaction.     Jarrod Bodkins A Ebubechukwu Jedlicka

## 2024-01-23 NOTE — Op Note (Signed)
 Preoperative diagnosis: Chronic interstitial cystitis Postoperative diagnosis: Chronic interstitial cystitis Surgery: Cystoscopy bladder hydrodistention and bladder instillation treatment Surgeon: Dr. Glendia Keosha Rossa  The patient has the above diagnosis and consented to the above procedure.  She had hydrodistention that was negative in 2021.  She had a positive hydrodistention 2024.  There was some question on how well she has responded in the past the pain.  We had decided to go ahead with the third hydrodistention recognizing lack of treatment options.  She went to the emergency room 2 weeks ago and had a positive urine culture treated with Keflex .  It was intermediate sensitive to ciprofloxacin .  For this reason she got Cipro  and Ancef  prior to this hydrodistention.  Clinically not infected today  Patient underwent cystoscopy.  Bladder mucosa and trigone were normal.  No cystitis.  No erythema like she had 4 years ago.  She had had 2 previous biopsies 4 years ago and I can see the old biopsy sites in the 2024 hydrodistention.  The bladder mucosa again was normal and no ulcers.  No carcinoma.  No erythema.  Increased vascularity  She was hydrodistended for 5 minutes and 600 mL.  I then emptied her bladder.  On reinspection I could not now see the biopsy sites but they were not bleeding.  No ulcers.  Moderate glomerulations.  Bladder was emptied  Red rubber catheter 16 [place] was inserted.  30 cc of 0.25% [place]  was inserted.  She will be followed as per protocol

## 2024-01-23 NOTE — Anesthesia Procedure Notes (Signed)
 Procedure Name: LMA Insertion Date/Time: 01/23/2024 12:11 PM  Performed by: Nada Corean CROME, CRNAPre-anesthesia Checklist: Emergency Drugs available, Patient identified, Suction available, Timeout performed and Patient being monitored Patient Re-evaluated:Patient Re-evaluated prior to induction Oxygen Delivery Method: Circle system utilized Preoxygenation: Pre-oxygenation with 100% oxygen Induction Type: IV induction LMA: LMA inserted LMA Size: 5.0 Tube type: Oral Number of attempts: 1 Placement Confirmation: positive ETCO2 and breath sounds checked- equal and bilateral Tube secured with: Tape Dental Injury: Teeth and Oropharynx as per pre-operative assessment

## 2024-01-24 ENCOUNTER — Encounter (HOSPITAL_COMMUNITY): Payer: Self-pay | Admitting: Urology

## 2024-01-25 LAB — URINE CULTURE: Culture: 80000 — AB

## 2024-01-26 ENCOUNTER — Other Ambulatory Visit (HOSPITAL_COMMUNITY): Payer: Self-pay

## 2024-01-26 MED ORDER — SULFAMETHOXAZOLE-TRIMETHOPRIM 800-160 MG PO TABS
1.0000 | ORAL_TABLET | Freq: Two times a day (BID) | ORAL | 0 refills | Status: DC
  Filled 2024-01-26: qty 14, 7d supply, fill #0

## 2024-02-09 ENCOUNTER — Other Ambulatory Visit (HOSPITAL_COMMUNITY): Payer: Self-pay

## 2024-02-09 DIAGNOSIS — R350 Frequency of micturition: Secondary | ICD-10-CM | POA: Diagnosis not present

## 2024-02-09 DIAGNOSIS — R310 Gross hematuria: Secondary | ICD-10-CM | POA: Diagnosis not present

## 2024-02-09 DIAGNOSIS — R301 Vesical tenesmus: Secondary | ICD-10-CM | POA: Diagnosis not present

## 2024-02-09 DIAGNOSIS — N3010 Interstitial cystitis (chronic) without hematuria: Secondary | ICD-10-CM | POA: Diagnosis not present

## 2024-02-09 MED ORDER — MELOXICAM 15 MG PO TABS
15.0000 mg | ORAL_TABLET | Freq: Every day | ORAL | 0 refills | Status: DC
  Filled 2024-02-09: qty 10, 10d supply, fill #0

## 2024-02-09 NOTE — Progress Notes (Signed)
 Kayla [name]                                          MRN: [id number]   02/09/2024   The [facility] Quality Team Specialist reviewed this patient medical record for the purposes of chart review for care gap closure. The following were reviewed: abstraction for care gap closure-kidney health evaluation for diabetes:eGFR  and uACR.    [facility] Quality Team

## 2024-03-18 ENCOUNTER — Other Ambulatory Visit (HOSPITAL_COMMUNITY): Payer: Self-pay

## 2024-03-18 ENCOUNTER — Ambulatory Visit: Admitting: Family Medicine

## 2024-03-18 ENCOUNTER — Other Ambulatory Visit: Payer: Self-pay

## 2024-03-18 ENCOUNTER — Encounter: Payer: Self-pay | Admitting: Family Medicine

## 2024-03-18 ENCOUNTER — Ambulatory Visit: Payer: Self-pay | Admitting: Family Medicine

## 2024-03-18 VITALS — BP 128/80 | HR 100 | Temp 97.9°F | Resp 16 | Ht 66.0 in | Wt 238.0 lb

## 2024-03-18 DIAGNOSIS — N3010 Interstitial cystitis (chronic) without hematuria: Secondary | ICD-10-CM

## 2024-03-18 DIAGNOSIS — E785 Hyperlipidemia, unspecified: Secondary | ICD-10-CM | POA: Diagnosis not present

## 2024-03-18 DIAGNOSIS — I10 Essential (primary) hypertension: Secondary | ICD-10-CM | POA: Diagnosis not present

## 2024-03-18 DIAGNOSIS — R1024 Suprapubic pain: Secondary | ICD-10-CM | POA: Diagnosis not present

## 2024-03-18 DIAGNOSIS — E1169 Type 2 diabetes mellitus with other specified complication: Secondary | ICD-10-CM

## 2024-03-18 DIAGNOSIS — G4700 Insomnia, unspecified: Secondary | ICD-10-CM | POA: Diagnosis not present

## 2024-03-18 DIAGNOSIS — F4320 Adjustment disorder, unspecified: Secondary | ICD-10-CM

## 2024-03-18 LAB — LIPID PANEL
Cholesterol: 140 mg/dL (ref 0–200)
HDL: 58.5 mg/dL (ref >39.00)
LDL Cholesterol: 61 mg/dL (ref 0–99)
NonHDL: 81.77
Total CHOL/HDL Ratio: 2
Triglycerides: 106 mg/dL (ref 0.0–149.0)
VLDL: 21.2 mg/dL (ref 0.0–40.0)

## 2024-03-18 LAB — CBC
HCT: 39.1 % (ref 36.0–46.0)
Hemoglobin: 12.6 g/dL (ref 12.0–15.0)
MCHC: 32.4 g/dL (ref 30.0–36.0)
MCV: 85.3 fl (ref 78.0–100.0)
Platelets: 264 K/uL (ref 150.0–400.0)
RBC: 4.58 Mil/uL (ref 3.87–5.11)
RDW: 13.8 % (ref 11.5–15.5)
WBC: 7.6 K/uL (ref 4.0–10.5)

## 2024-03-18 LAB — BASIC METABOLIC PANEL WITH GFR
BUN: 21 mg/dL (ref 6–23)
CO2: 25 meq/L (ref 19–32)
Calcium: 9 mg/dL (ref 8.4–10.5)
Chloride: 105 meq/L (ref 96–112)
Creatinine, Ser: 0.79 mg/dL (ref 0.40–1.20)
GFR: 84 mL/min (ref >60.00)
Glucose, Bld: 92 mg/dL (ref 70–99)
Potassium: 3.8 meq/L (ref 3.5–5.1)
Sodium: 139 meq/L (ref 135–145)

## 2024-03-18 LAB — POCT GLYCOSYLATED HEMOGLOBIN (HGB A1C): Hemoglobin A1C: 5.8 % — AB (ref 4.0–5.6)

## 2024-03-18 MED ORDER — DOXEPIN HCL 10 MG PO CAPS
10.0000 mg | ORAL_CAPSULE | Freq: Every day | ORAL | 1 refills | Status: AC
  Filled 2024-03-18: qty 30, 30d supply, fill #0
  Filled 2024-06-13: qty 30, 30d supply, fill #1

## 2024-03-18 NOTE — Assessment & Plan Note (Addendum)
 Comorbidities: Hypertension, hyperlipidemia, and obesity. HgA1C has been at goal with non pharmacologic treatment since bariatric procedure in 2011. HgA1C today 5.8. Annual eye exam, periodic dental and foot care recommended. F/U in 5-6 months.

## 2024-03-18 NOTE — Patient Instructions (Addendum)
 A few things to remember from today's visit:  Type 2 diabetes mellitus with other specified complication, without long-term current use of insulin  (HCC) - Plan: POC HgB A1c  Essential hypertension - Plan: Basic metabolic panel with GFR  Suprapubic abdominal pain - Plan: Urinalysis with Culture Reflex, CBC, Basic metabolic panel with GFR  Insomnia, unspecified type  IC (interstitial cystitis) - Plan: Urinalysis with Culture Reflex  Dyslipidemia (high LDL; low HDL) - Plan: Lipid panel  Doxepin 10 mg addend today for sleep, may help with diarrhea and even with cystitis. Could interact with Uribel , so do not take it together. Let me know in 6 weeks if it is helping. Monitor for new symptoms. Arrange follow up appt with urologist.  If you need refills for medications you take chronically, please call your pharmacy. Do not use My Chart to request refills or for acute issues that need immediate attention. If you send a my chart message, it may take a few days to be addressed, specially if I am not in the office.  Please be sure medication list is accurate. If a new problem present, please set up appointment sooner than planned today.

## 2024-03-18 NOTE — Assessment & Plan Note (Signed)
 Currently she is on pravastatin  20 mg daily, started in 11/2023. Low-fat diet also recommended. Last LDL 87 in 11/2023. Further recommendation will be given according to lab results.

## 2024-03-18 NOTE — Assessment & Plan Note (Signed)
 BP adequately controlled. Continue amlodipine  2.5 mg daily and low-salt diet. Monitor BP at home. Eye exam is due.

## 2024-03-18 NOTE — Assessment & Plan Note (Signed)
 This is a chronic problem, it seems to be getting worse lately. OTC medications have not helped. We discussed some pharmacologic treatment options, she agrees with trying doxepin 10 mg daily at bedtime.  We discussed son side effects. Continue adequate sleep hygiene. Instructed to let me know in about 4 weeks if medication is helping, before if she has significant side effects.

## 2024-03-18 NOTE — Progress Notes (Signed)
 Chief Complaint  Patient presents with   Medical Management of Chronic Issues   Discussed the use of AI scribe software for clinical note transcription with the patient, who gave verbal consent to proceed.  History of Present Illness Kayla [name] is a 53 year old female with past medical history significant for DM 2, hypertension, hyperlipidemia, chronic cystitis, and vitamin D  deficiency here today for chronic disease management. Last seen on 11/15/2023 for her CPE. Since her last visit she has undergone cystoscopy with bladder hydrodistention.  She is requesting a UA today due to ongoing suprapubic discomfort following a cystoscopy performed on January 23, 2024. The discomfort is described as similar to a 'bad UTI' with suprapubic pain, but there is no burning or blood in the urine currently. Prior to the procedure, she experienced significant urinary retention, requiring catheterization every three days, which led to a severe urine tract infection with blood clots. Post-procedure, she continues to experience discomfort and was prescribed meloxicam  for relief. She is planning on arranging appointment with her urologist.  She has a history of interstitial cystitis and identifies stress and chocolate as triggers for her symptoms. Significant stress following the recent death of her husband from pancreatic cancer has exacerbated her symptoms. She is experiencing anxiety and difficulty sleeping, often waking up in the early hours and unable to return to sleep. She is also experiencing loose stools, which she attributes to stress.  She is planning on arranging counseling through hospice, which has been offered.  -She also reports an earache in her left ear that occurs only at night, persisting for about two weeks, without changes in hearing or other associated symptoms.  Socially, she is adjusting to life without her husband, who managed most household responsibilities. She is back at work  and involved in church activities to maintain a routine. She has two sons, one in college and the other living independently, and is trying to manage her household and financial responsibilities.  Hypertension: Currently on amlodipine  2.5 mg daily. Negative for unusual or severe headache, visual changes, exertional chest pain, dyspnea,  focal weakness, or edema.  Lab Results  Component Value Date   CREATININE 1.25 (H) 01/06/2024   BUN 21 (H) 01/06/2024   NA 140 01/06/2024   K 4.3 01/06/2024   CL 105 01/06/2024   CO2 23 01/06/2024   Diabetes Mellitus II:  Dx'ed in 2009 and in remission after bariatric surgery in 2011  Currently she is on nonpharmacologic treatment. Does not monitor BS's. Negative for symptoms of hypoglycemia, polyuria, polydipsia, numbness extremities, foot ulcers/trauma  Lab Results  Component Value Date   HGBA1C 6.0 11/15/2023   Lab Results  Component Value Date   MICROALBUR <0.7 11/15/2023    Hyperlipidemia: Last visit pravastatin  20 mg daily was recommended.  She has been taking medication daily since 11/2023, no side effects reported. Lab Results  Component Value Date   CHOL 166 11/15/2023   HDL 67.20 11/15/2023   LDLCALC 87 11/15/2023   TRIG 56.0 11/15/2023   CHOLHDL 2 11/15/2023   Review of Systems  Constitutional:  Positive for fatigue. Negative for activity change, appetite change, chills and fever.  HENT:  Negative for sore throat and trouble swallowing.   Respiratory:  Negative for cough and wheezing.   Endocrine: Negative for cold intolerance and heat intolerance.  Skin:  Negative for rash.  Neurological:  Negative for syncope and facial asymmetry.  Psychiatric/Behavioral:  Positive for sleep disturbance. Negative for confusion and hallucinations.  The patient is nervous/anxious.   See other pertinent positives and negatives in HPI.  Current Outpatient Medications on File Prior to Visit  Medication Sig Dispense Refill   amLODipine  (NORVASC )  2.5 MG tablet Take 1 tablet (2.5 mg total) by mouth daily. 90 tablet 3   meloxicam  (MOBIC ) 15 MG tablet Take 1 tablet (15 mg total) by mouth daily with meals 10 tablet 0   naproxen sodium (ALEVE) 220 MG tablet Take 440 mg by mouth 2 (two) times daily as needed (pain).     oxybutynin  (DITROPAN -XL) 10 MG 24 hr tablet Take 1 tablet (10 mg total) by mouth daily. 30 tablet 11   oxybutynin  (DITROPAN -XL) 10 MG 24 hr tablet Take 1 tablet (10 mg total) by mouth daily. 90 tablet 3   pentosan polysulfate (ELMIRON ) 100 MG capsule Take 2 capsules (200 mg total) by mouth 2 (two) times daily. 120 capsule 11   pravastatin  (PRAVACHOL ) 20 MG tablet Take 1 tablet (20 mg total) by mouth daily. 90 tablet 3   tamsulosin  (FLOMAX ) 0.4 MG CAPS capsule Take 1 capsule (0.4 mg total) by mouth daily. 30 capsule 11   tamsulosin  (FLOMAX ) 0.4 MG CAPS capsule Take 1 capsule (0.4 mg total) by mouth daily. 90 capsule 3   cephALEXin  (KEFLEX ) 500 MG capsule Take 1 capsule (500 mg total) by mouth 2 (two) times daily. (Patient not taking: Reported on 03/18/2024) 14 capsule 0   cetirizine  (ZYRTEC ) 10 MG tablet Take 1 tablet (10 mg total) by mouth daily. (Patient not taking: Reported on 03/18/2024) 30 tablet 11   HYDROcodone -acetaminophen  (NORCO/VICODIN) 5-325 MG tablet Take 1-2 tablets by mouth every 4 (four) hours as needed. (Patient not taking: Reported on 03/18/2024) 10 tablet 0   Meth-Hyo-M Bl-Benz Acd-Ph Sal (URIBEL ) 81.6 MG TABS Take 1 tablet by mouth 4 (four) times daily. (Patient not taking: Reported on 03/18/2024) 30 tablet 5   sulfamethoxazole -trimethoprim  (BACTRIM  DS) 800-160 MG tablet Take 1 tablet by mouth 2 (two) times daily. (Patient not taking: Reported on 03/18/2024) 14 tablet 0   No current facility-administered medications on file prior to visit.    Past Medical History:  Diagnosis Date   Allergy    Anemia    Arthritis    lt knee   Back pain    Breast cyst, right    Constipation    Dyspnea    Fatigue    GERD  (gastroesophageal reflux disease)    Hyperlipidemia    Hypertension    Joint pain    Leg edema    Obesity    Pre-diabetes    Sleep apnea    lost weight 2013 gastric sleeve, no longer a problem   Sleep disturbance    SVD (spontaneous vaginal delivery)    x 3   Tobacco use     No Known Allergies  Social History   Socioeconomic History   Marital status: Married    Spouse name: Jerel Joshua Raddle   Number of children: 4   Years of education: Not on file   Highest education level: Not on file  Occupational History   Not on file  Tobacco Use   Smoking status: Former    Current packs/day: 0.00    Average packs/day: 1 pack/day for 25.0 years (25.0 ttl pk-yrs)    Types: Cigarettes    Start date: 08/28/1995    Quit date: 08/27/2020    Years since quitting: 3.5   Smokeless tobacco: Current  Vaping Use   Vaping status: Never Used  Substance and Sexual Activity   Alcohol use: Not Currently   Drug use: No   Sexual activity: Yes    Partners: Male    Birth control/protection: Surgical  Other Topics Concern   Not on file  Social History Narrative   Work or School: corporate treasurer, geneticist, molecular      Home Situation: lives with husband and 22 yo sone in 2016      Spiritual Beliefs: Christian      Lifestyle: no regular exercise, poor diet      Social Drivers of Corporate Investment Banker Strain: Not on file  Food Insecurity: Not on file  Transportation Needs: Not on file  Physical Activity: Not on file  Stress: Not on file  Social Connections: Not on file   Today's Vitals   03/18/24 0734  BP: 128/80  Pulse: 100  Resp: 16  Temp: 97.9 F (36.6 C)  SpO2: 97%  Weight: 238 lb (108 kg)  Height: 5' 6 (1.676 m)   Body mass index is 38.41 kg/m.  Physical Exam Vitals and nursing note reviewed.  Constitutional:      General: She is not in acute distress.    Appearance: She is well-developed.  HENT:     Head: Normocephalic and atraumatic.     Mouth/Throat:     Mouth:  Mucous membranes are moist.     Pharynx: Oropharynx is clear.  Eyes:     Conjunctiva/sclera: Conjunctivae normal.  Cardiovascular:     Rate and Rhythm: Normal rate and regular rhythm.     Pulses:          Dorsalis pedis pulses are 2+ on the right side and 2+ on the left side.     Heart sounds: Murmur (Soft SEM LUSB?) heard.  Pulmonary:     Effort: Pulmonary effort is normal. No respiratory distress.     Breath sounds: Normal breath sounds.  Abdominal:     Palpations: Abdomen is soft. There is no hepatomegaly or mass.     Tenderness: There is no abdominal tenderness.  Lymphadenopathy:     Cervical: No cervical adenopathy.  Skin:    General: Skin is warm.     Findings: No erythema or rash.  Neurological:     General: No focal deficit present.     Mental Status: She is alert and oriented to person, place, and time.     Gait: Gait normal.  Psychiatric:        Mood and Affect: Mood and affect normal.   ASSESSMENT AND PLAN:  Ms. Aymara Sassi was seen today for medical management of chronic issues.  Diagnoses and all orders for this visit: Orders Placed This Encounter  Procedures   Urinalysis with Culture Reflex   CBC   Basic metabolic panel with GFR   Lipid panel   POC HgB A1c   Lab Results  Component Value Date   HGBA1C 5.8 (A) 03/18/2024   Lab Results  Component Value Date   WBC 7.6 03/18/2024   HGB 12.6 03/18/2024   HCT 39.1 03/18/2024   MCV 85.3 03/18/2024   PLT 264.0 03/18/2024   Lab Results  Component Value Date   NA 139 03/18/2024   CL 105 03/18/2024   K 3.8 03/18/2024   CO2 25 03/18/2024   BUN 21 03/18/2024   CREATININE 0.79 03/18/2024   GFR 84.00 03/18/2024   CALCIUM 9.0 03/18/2024   ALBUMIN 4.2 11/15/2023   GLUCOSE 92 03/18/2024   Lab Results  Component Value Date  CHOL 140 03/18/2024   HDL 58.50 03/18/2024   LDLCALC 61 03/18/2024   TRIG 106.0 03/18/2024   CHOLHDL 2 03/18/2024   Type 2 diabetes mellitus with other specified complication,  without long-term current use of insulin  (HCC) Assessment & Plan: Comorbidities: Hypertension, hyperlipidemia, and obesity. HgA1C has been at goal with non pharmacologic treatment since bariatric procedure in 2011. HgA1C today 5.8. Annual eye exam, periodic dental and foot care recommended. F/U in 5-6 months.  Orders: -     POCT glycosylated hemoglobin (Hb A1C)  Essential hypertension Assessment & Plan: BP adequately controlled. Continue amlodipine  2.5 mg daily and low-salt diet. Question of soft heart murmur noted today.  Echo done in 11/2022 overall normal. I do not think further studies are needed at this time, instructed about warning signs. Monitor BP at home. Eye exam is due.  Orders: -     Basic metabolic panel with GFR; Future  Suprapubic abdominal pain We discussed possible etiologies. She has a problem since before and after cystoscopy procedure. We discussed differential diagnosis. Monitor for new symptoms. Instructed about warning signs. Planning on following with urologist. Further recommendation will be given according to lab results.  -     Urinalysis w microscopic + reflex cultur -     CBC; Future -     Basic metabolic panel with GFR; Future  Insomnia, unspecified type Assessment & Plan: This is a chronic problem, it seems to be getting worse lately. OTC medications have not helped. We discussed some pharmacologic treatment options, she agrees with trying doxepin 10 mg daily at bedtime.  We discussed son side effects. Continue adequate sleep hygiene. Instructed to let me know in about 4 weeks if medication is helping, before if she has significant side effects.  Orders: -     Doxepin HCl; Take 1 capsule (10 mg total) by mouth at bedtime.  Dispense: 30 capsule; Refill: 1  IC (interstitial cystitis) Today she reported suprapubic abdominal pain, no associated gross hematuria at this time or dysuria. We discussed symptoms related with IC and treatment  options. She is planning on arranging appointment with her urologist. Further recommendation will be given according to UA results.  -     Urinalysis w microscopic + reflex cultur  Dyslipidemia (high LDL; low HDL) Assessment & Plan: Currently she is on pravastatin  20 mg daily, started in 11/2023. Low-fat diet also recommended. Last LDL 87 in 11/2023. Further recommendation will be given according to lab results.  Orders: -     Lipid panel; Future  Grief reaction In general she feels like she is dealing well with the stress. She has a good family support. Agencies that provided hospice for her husband has offered counseling, she has not scheduled appointment. Doxepin 10 mg at bedtime added today to help with the sleep.  I personally spent a total of 46 minutes in the care of the patient today including preparing to see the patient, getting/reviewing separately obtained history, performing a medically appropriate exam/evaluation, counseling and educating, placing orders, documenting clinical information in the EHR, and communicating results.  Return in about 5 months (around 08/16/2024) for chronic problems. Before if needed..   Yilia Sacca, MD [facility]. [place] office.

## 2024-03-19 ENCOUNTER — Other Ambulatory Visit (HOSPITAL_COMMUNITY): Payer: Self-pay

## 2024-03-20 LAB — URINALYSIS W MICROSCOPIC + REFLEX CULTURE
Bilirubin Urine: NEGATIVE
Glucose, UA: NEGATIVE
Hgb urine dipstick: NEGATIVE
Nitrites, Initial: NEGATIVE
Specific Gravity, Urine: 1.026 (ref 1.001–1.035)
pH: 5.5 (ref 5.0–8.0)

## 2024-03-20 LAB — CULTURE INDICATED

## 2024-03-20 LAB — URINE CULTURE
MICRO NUMBER:: 17216304
Result:: NO GROWTH
SPECIMEN QUALITY:: ADEQUATE

## 2024-03-28 MED ORDER — MELOXICAM 15 MG PO TABS
15.0000 mg | ORAL_TABLET | Freq: Every day | ORAL | 0 refills | Status: DC
  Filled 2024-03-28: qty 10, 10d supply, fill #0

## 2024-03-29 ENCOUNTER — Other Ambulatory Visit (HOSPITAL_COMMUNITY): Payer: Self-pay

## 2024-06-13 ENCOUNTER — Other Ambulatory Visit: Payer: Self-pay

## 2024-06-13 ENCOUNTER — Other Ambulatory Visit (HOSPITAL_COMMUNITY): Payer: Self-pay

## 2024-06-13 MED ORDER — MELOXICAM 15 MG PO TABS
15.0000 mg | ORAL_TABLET | Freq: Every day | ORAL | 0 refills | Status: AC
  Filled 2024-06-13: qty 10, 10d supply, fill #0
